# Patient Record
Sex: Male | Born: 1940 | Race: White | Hispanic: No | Smoking: Never smoker
Health system: Southern US, Community
[De-identification: ages and names within clinical notes are randomized; demographics above are authoritative.]

---

## 2020-03-31 ENCOUNTER — Other Ambulatory Visit: Payer: Self-pay

## 2020-03-31 ENCOUNTER — Emergency Department: Payer: Medicare Other

## 2020-03-31 ENCOUNTER — Inpatient Hospital Stay
Admission: EM | Admit: 2020-03-31 | Discharge: 2020-04-12 | DRG: 177 | Disposition: A | Discharge status: Home Health | Payer: Medicare Other | Source: Skilled Nursing Facility | Attending: Family Medicine | Admitting: Family Medicine

## 2020-03-31 ENCOUNTER — Inpatient Hospital Stay: Payer: Medicare Other

## 2020-03-31 ENCOUNTER — Encounter: Payer: Self-pay | Admitting: Radiology

## 2020-03-31 DIAGNOSIS — I248 Other forms of acute ischemic heart disease: Secondary | ICD-10-CM | POA: Diagnosis present

## 2020-03-31 DIAGNOSIS — M109 Gout, unspecified: Secondary | ICD-10-CM | POA: Diagnosis present

## 2020-03-31 DIAGNOSIS — Z7901 Long term (current) use of anticoagulants: Secondary | ICD-10-CM

## 2020-03-31 DIAGNOSIS — R0902 Hypoxemia: Secondary | ICD-10-CM

## 2020-03-31 DIAGNOSIS — Z23 Encounter for immunization: Secondary | ICD-10-CM | POA: Diagnosis not present

## 2020-03-31 DIAGNOSIS — R778 Other specified abnormalities of plasma proteins: Secondary | ICD-10-CM | POA: Diagnosis present

## 2020-03-31 DIAGNOSIS — I2782 Chronic pulmonary embolism: Secondary | ICD-10-CM | POA: Diagnosis present

## 2020-03-31 DIAGNOSIS — J690 Pneumonitis due to inhalation of food and vomit: Secondary | ICD-10-CM | POA: Diagnosis present

## 2020-03-31 DIAGNOSIS — J189 Pneumonia, unspecified organism: Secondary | ICD-10-CM

## 2020-03-31 DIAGNOSIS — Z8249 Family history of ischemic heart disease and other diseases of the circulatory system: Secondary | ICD-10-CM | POA: Diagnosis not present

## 2020-03-31 DIAGNOSIS — Y92231 Patient bathroom in hospital as the place of occurrence of the external cause: Secondary | ICD-10-CM | POA: Diagnosis not present

## 2020-03-31 DIAGNOSIS — R001 Bradycardia, unspecified: Secondary | ICD-10-CM | POA: Diagnosis not present

## 2020-03-31 DIAGNOSIS — E785 Hyperlipidemia, unspecified: Secondary | ICD-10-CM | POA: Diagnosis present

## 2020-03-31 DIAGNOSIS — J9621 Acute and chronic respiratory failure with hypoxia: Secondary | ICD-10-CM | POA: Diagnosis present

## 2020-03-31 DIAGNOSIS — F0390 Unspecified dementia without behavioral disturbance: Secondary | ICD-10-CM | POA: Diagnosis present

## 2020-03-31 DIAGNOSIS — J439 Emphysema, unspecified: Secondary | ICD-10-CM | POA: Diagnosis present

## 2020-03-31 DIAGNOSIS — Z96611 Presence of right artificial shoulder joint: Secondary | ICD-10-CM | POA: Diagnosis present

## 2020-03-31 DIAGNOSIS — I110 Hypertensive heart disease with heart failure: Secondary | ICD-10-CM | POA: Diagnosis present

## 2020-03-31 DIAGNOSIS — Z7952 Long term (current) use of systemic steroids: Secondary | ICD-10-CM | POA: Diagnosis not present

## 2020-03-31 DIAGNOSIS — E440 Moderate protein-calorie malnutrition: Secondary | ICD-10-CM | POA: Diagnosis present

## 2020-03-31 DIAGNOSIS — F32A Depression, unspecified: Secondary | ICD-10-CM | POA: Diagnosis present

## 2020-03-31 DIAGNOSIS — Z6829 Body mass index (BMI) 29.0-29.9, adult: Secondary | ICD-10-CM | POA: Diagnosis not present

## 2020-03-31 DIAGNOSIS — J9622 Acute and chronic respiratory failure with hypercapnia: Secondary | ICD-10-CM | POA: Diagnosis present

## 2020-03-31 DIAGNOSIS — R0602 Shortness of breath: Secondary | ICD-10-CM | POA: Diagnosis not present

## 2020-03-31 DIAGNOSIS — Z79899 Other long term (current) drug therapy: Secondary | ICD-10-CM | POA: Diagnosis not present

## 2020-03-31 DIAGNOSIS — R600 Localized edema: Secondary | ICD-10-CM | POA: Diagnosis present

## 2020-03-31 DIAGNOSIS — I10 Essential (primary) hypertension: Secondary | ICD-10-CM

## 2020-03-31 DIAGNOSIS — S41112A Laceration without foreign body of left upper arm, initial encounter: Secondary | ICD-10-CM | POA: Diagnosis not present

## 2020-03-31 DIAGNOSIS — Z9981 Dependence on supplemental oxygen: Secondary | ICD-10-CM

## 2020-03-31 DIAGNOSIS — I5032 Chronic diastolic (congestive) heart failure: Secondary | ICD-10-CM | POA: Diagnosis present

## 2020-03-31 DIAGNOSIS — W1830XA Fall on same level, unspecified, initial encounter: Secondary | ICD-10-CM | POA: Diagnosis not present

## 2020-03-31 DIAGNOSIS — I214 Non-ST elevation (NSTEMI) myocardial infarction: Secondary | ICD-10-CM | POA: Diagnosis present

## 2020-03-31 DIAGNOSIS — Z20822 Contact with and (suspected) exposure to covid-19: Secondary | ICD-10-CM | POA: Diagnosis present

## 2020-03-31 DIAGNOSIS — Z7982 Long term (current) use of aspirin: Secondary | ICD-10-CM

## 2020-03-31 DIAGNOSIS — J9811 Atelectasis: Secondary | ICD-10-CM

## 2020-03-31 LAB — LIPID PANEL
Cholesterol: 148 mg/dL (ref 0–200)
HDL: 30 mg/dL — ABNORMAL LOW (ref >40)
LDL Cholesterol: 101 mg/dL — ABNORMAL HIGH (ref 0–99)
Total CHOL/HDL Ratio: 4.9 RATIO
Triglycerides: 86 mg/dL (ref <150)
VLDL: 17 mg/dL (ref 0–40)

## 2020-03-31 LAB — BASIC METABOLIC PANEL
Anion gap: 7 (ref 5–15)
Anion gap: 8 (ref 5–15)
BUN: 39 mg/dL — ABNORMAL HIGH (ref 8–23)
BUN: 40 mg/dL — ABNORMAL HIGH (ref 8–23)
CO2: 36 mmol/L — ABNORMAL HIGH (ref 22–32)
CO2: 38 mmol/L — ABNORMAL HIGH (ref 22–32)
Calcium: 9.1 mg/dL (ref 8.9–10.3)
Calcium: 9.1 mg/dL (ref 8.9–10.3)
Chloride: 96 mmol/L — ABNORMAL LOW (ref 98–111)
Chloride: 94 mmol/L — ABNORMAL LOW (ref 98–111)
Creatinine, Ser: 0.74 mg/dL (ref 0.61–1.24)
Creatinine, Ser: 0.78 mg/dL (ref 0.61–1.24)
GFR, Estimated: >60 mL/min (ref >60)
GFR, Estimated: >60 mL/min (ref >60)
Glucose, Bld: 119 mg/dL — ABNORMAL HIGH (ref 70–99)
Glucose, Bld: 105 mg/dL — ABNORMAL HIGH (ref 70–99)
Potassium: 3.8 mmol/L (ref 3.5–5.1)
Potassium: 3.8 mmol/L (ref 3.5–5.1)
Sodium: 139 mmol/L (ref 135–145)
Sodium: 140 mmol/L (ref 135–145)

## 2020-03-31 LAB — HEPARIN LEVEL (UNFRACTIONATED): Heparin Unfractionated: 2.56 IU/mL — ABNORMAL HIGH (ref 0.30–0.70)

## 2020-03-31 LAB — CBC
HCT: 48.4 % (ref 39.0–52.0)
HCT: 45.8 % (ref 39.0–52.0)
Hemoglobin: 13.8 g/dL (ref 13.0–17.0)
Hemoglobin: 13.2 g/dL (ref 13.0–17.0)
MCH: 24.8 pg — ABNORMAL LOW (ref 26.0–34.0)
MCH: 25.2 pg — ABNORMAL LOW (ref 26.0–34.0)
MCHC: 28.5 g/dL — ABNORMAL LOW (ref 30.0–36.0)
MCHC: 28.8 g/dL — ABNORMAL LOW (ref 30.0–36.0)
MCV: 87.1 fL (ref 80.0–100.0)
MCV: 87.4 fL (ref 80.0–100.0)
Platelets: 174 10*3/uL (ref 150–400)
Platelets: 156 10*3/uL (ref 150–400)
RBC: 5.56 MIL/uL (ref 4.22–5.81)
RBC: 5.24 MIL/uL (ref 4.22–5.81)
RDW: 14.6 % (ref 11.5–15.5)
RDW: 14.6 % (ref 11.5–15.5)
WBC: 9.7 10*3/uL (ref 4.0–10.5)
WBC: 8.5 10*3/uL (ref 4.0–10.5)
nRBC: 0 % (ref 0.0–0.2)
nRBC: 0 % (ref 0.0–0.2)

## 2020-03-31 LAB — PROCALCITONIN: Procalcitonin: <0.1 ng/mL

## 2020-03-31 LAB — PROTIME-INR
INR: 1.4 — ABNORMAL HIGH (ref 0.8–1.2)
Prothrombin Time: 16.8 seconds — ABNORMAL HIGH (ref 11.4–15.2)

## 2020-03-31 LAB — HIV ANTIBODY (ROUTINE TESTING W REFLEX): HIV Screen 4th Generation wRfx: NONREACTIVE

## 2020-03-31 LAB — APTT
aPTT: 45 seconds — ABNORMAL HIGH (ref 24–36)
aPTT: 62 seconds — ABNORMAL HIGH (ref 24–36)
aPTT: 84 seconds — ABNORMAL HIGH (ref 24–36)

## 2020-03-31 LAB — RESP PANEL BY RT-PCR (FLU A&B, COVID) ARPGX2
Influenza A by PCR: NEGATIVE
Influenza B by PCR: NEGATIVE
SARS Coronavirus 2 by RT PCR: NEGATIVE

## 2020-03-31 LAB — LACTIC ACID, PLASMA: Lactic Acid, Venous: 1 mmol/L (ref 0.5–1.9)

## 2020-03-31 LAB — TROPONIN I (HIGH SENSITIVITY)
Troponin I (High Sensitivity): 1263 ng/L (ref <18)
Troponin I (High Sensitivity): 1218 ng/L (ref <18)

## 2020-03-31 LAB — BRAIN NATRIURETIC PEPTIDE: B Natriuretic Peptide: 23.2 pg/mL (ref 0.0–100.0)

## 2020-03-31 MED ORDER — SODIUM CHLORIDE 0.9 % IV SOLN: INTRAVENOUS | Status: DC

## 2020-03-31 MED ORDER — HEPARIN BOLUS VIA INFUSION
4000.0000 [IU] | INTRAVENOUS | Status: AC
  Administered 2020-03-31: 4000 [IU] via INTRAVENOUS
  Filled 2020-03-31: qty 4000

## 2020-03-31 MED ORDER — PREDNISOLONE ACETATE 1 % OP SUSP
1.0000 [drp] | Freq: Two times a day (BID) | OPHTHALMIC | Status: DC
  Administered 2020-03-31 – 2020-04-12 (×23): 1 [drp] via OPHTHALMIC
  Filled 2020-03-31: qty 5
  Filled 2020-03-31 (×2): qty 1

## 2020-03-31 MED ORDER — MAGNESIUM OXIDE 400 (241.3 MG) MG PO TABS
400.0000 mg | ORAL_TABLET | Freq: Two times a day (BID) | ORAL | Status: DC
Start: 2020-03-31 — End: 2020-04-13
  Administered 2020-03-31 – 2020-04-12 (×24): 400 mg via ORAL
  Filled 2020-03-31 (×24): qty 1

## 2020-03-31 MED ORDER — ERYTHROMYCIN 5 MG/GM OP OINT
1.0000 "application " | TOPICAL_OINTMENT | Freq: Three times a day (TID) | OPHTHALMIC | Status: DC
  Administered 2020-03-31 – 2020-04-12 (×34): 1 via OPHTHALMIC
  Filled 2020-03-31 (×3): qty 1

## 2020-03-31 MED ORDER — ESCITALOPRAM OXALATE 10 MG PO TABS
20.0000 mg | ORAL_TABLET | Freq: Every day | ORAL | Status: DC
  Administered 2020-03-31 – 2020-04-12 (×13): 20 mg via ORAL
  Filled 2020-03-31 (×13): qty 2

## 2020-03-31 MED ORDER — GUAIFENESIN ER 600 MG PO TB12
600.0000 mg | ORAL_TABLET | Freq: Two times a day (BID) | ORAL | Status: DC
  Administered 2020-03-31 – 2020-04-12 (×24): 600 mg via ORAL
  Filled 2020-03-31 (×24): qty 1

## 2020-03-31 MED ORDER — ALBUTEROL SULFATE HFA 108 (90 BASE) MCG/ACT IN AERS
2.0000 | INHALATION_SPRAY | RESPIRATORY_TRACT | Status: DC | PRN
  Filled 2020-03-31: qty 6.7

## 2020-03-31 MED ORDER — HEPARIN (PORCINE) 25000 UT/250ML-% IV SOLN
1450.0000 [IU]/h | INTRAVENOUS | Status: DC
  Administered 2020-03-31: 22:00:00 1250 [IU]/h via INTRAVENOUS
  Administered 2020-03-31: 04:00:00 1100 [IU]/h via INTRAVENOUS
  Administered 2020-04-01: 1250 [IU]/h via INTRAVENOUS
  Filled 2020-03-31 (×3): qty 250

## 2020-03-31 MED ORDER — THIAMINE HCL 100 MG PO TABS
100.0000 mg | ORAL_TABLET | Freq: Every day | ORAL | Status: DC
  Administered 2020-03-31 – 2020-04-01 (×2): 100 mg via ORAL
  Filled 2020-03-31 (×2): qty 1

## 2020-03-31 MED ORDER — ADULT MULTIVITAMIN W/MINERALS CH
1.0000 | ORAL_TABLET | Freq: Every day | ORAL | Status: DC
  Administered 2020-03-31 – 2020-04-12 (×13): 1 via ORAL
  Filled 2020-03-31 (×14): qty 1

## 2020-03-31 MED ORDER — ASPIRIN 81 MG PO CHEW
324.0000 mg | CHEWABLE_TABLET | ORAL | Status: DC
  Filled 2020-03-31: qty 4

## 2020-03-31 MED ORDER — METHYLPREDNISOLONE SODIUM SUCC 125 MG IJ SOLR
125.0000 mg | Freq: Once | INTRAMUSCULAR | Status: AC
  Administered 2020-03-31: 125 mg via INTRAVENOUS
  Filled 2020-03-31: qty 2

## 2020-03-31 MED ORDER — IPRATROPIUM-ALBUTEROL 0.5-2.5 (3) MG/3ML IN SOLN: 3.0000 mL | RESPIRATORY_TRACT | Status: DC | PRN

## 2020-03-31 MED ORDER — METOPROLOL SUCCINATE ER 50 MG PO TB24: 50.0000 mg | ORAL_TABLET | Freq: Every day | ORAL | Status: DC

## 2020-03-31 MED ORDER — FOLIC ACID 1 MG PO TABS
1.0000 mg | ORAL_TABLET | Freq: Every day | ORAL | Status: DC
  Administered 2020-03-31 – 2020-04-12 (×13): 1 mg via ORAL
  Filled 2020-03-31 (×13): qty 1

## 2020-03-31 MED ORDER — FUROSEMIDE 20 MG PO TABS
20.0000 mg | ORAL_TABLET | Freq: Every day | ORAL | Status: DC | PRN
  Administered 2020-03-31: 20 mg via ORAL
  Filled 2020-03-31: qty 1

## 2020-03-31 MED ORDER — ACETAMINOPHEN 325 MG PO TABS
650.0000 mg | ORAL_TABLET | ORAL | Status: DC | PRN
  Administered 2020-03-31: 650 mg via ORAL
  Filled 2020-03-31: qty 2

## 2020-03-31 MED ORDER — ATORVASTATIN CALCIUM 20 MG PO TABS
80.0000 mg | ORAL_TABLET | Freq: Every day | ORAL | Status: DC
  Administered 2020-03-31 – 2020-04-12 (×13): 80 mg via ORAL
  Filled 2020-03-31 (×5): qty 1
  Filled 2020-03-31 (×4): qty 4
  Filled 2020-03-31 (×2): qty 1
  Filled 2020-03-31 (×2): qty 4

## 2020-03-31 MED ORDER — ASPIRIN EC 325 MG PO TBEC
325.0000 mg | DELAYED_RELEASE_TABLET | Freq: Every day | ORAL | Status: DC
  Administered 2020-03-31 – 2020-04-02 (×3): 325 mg via ORAL
  Filled 2020-03-31 (×3): qty 1

## 2020-03-31 MED ORDER — NITROGLYCERIN 0.4 MG SL SUBL
0.4000 mg | SUBLINGUAL_TABLET | SUBLINGUAL | Status: DC | PRN
Start: 2020-03-31 — End: 2020-04-13

## 2020-03-31 MED ORDER — ZOLPIDEM TARTRATE 5 MG PO TABS
5.0000 mg | ORAL_TABLET | Freq: Every evening | ORAL | Status: DC | PRN
  Administered 2020-04-02 – 2020-04-11 (×4): 5 mg via ORAL
  Filled 2020-03-31 (×4): qty 1

## 2020-03-31 MED ORDER — IOHEXOL 350 MG/ML SOLN
100.0000 mL | Freq: Once | INTRAVENOUS | Status: AC | PRN
  Administered 2020-03-31: 100 mL via INTRAVENOUS

## 2020-03-31 MED ORDER — ALPRAZOLAM 0.5 MG PO TABS
0.2500 mg | ORAL_TABLET | Freq: Two times a day (BID) | ORAL | Status: DC | PRN
  Administered 2020-03-31 – 2020-04-09 (×7): 0.25 mg via ORAL
  Filled 2020-03-31 (×7): qty 1

## 2020-03-31 MED ORDER — IPRATROPIUM-ALBUTEROL 0.5-2.5 (3) MG/3ML IN SOLN: 3.0000 mL | Freq: Four times a day (QID) | RESPIRATORY_TRACT | Status: DC

## 2020-03-31 MED ORDER — METOPROLOL SUCCINATE ER 50 MG PO TB24
75.0000 mg | ORAL_TABLET | Freq: Every day | ORAL | Status: DC
  Administered 2020-03-31 – 2020-04-10 (×10): 75 mg via ORAL
  Filled 2020-03-31 (×4): qty 1
  Filled 2020-03-31: qty 2
  Filled 2020-03-31 (×6): qty 1

## 2020-03-31 MED ORDER — ASPIRIN EC 81 MG PO TBEC: 81.0000 mg | DELAYED_RELEASE_TABLET | Freq: Every day | ORAL | Status: DC

## 2020-03-31 MED ORDER — ASPIRIN 300 MG RE SUPP: 300.0000 mg | RECTAL | Status: DC

## 2020-03-31 MED ORDER — POLYVINYL ALCOHOL 1.4 % OP SOLN
1.0000 [drp] | Freq: Four times a day (QID) | OPHTHALMIC | Status: DC
  Administered 2020-03-31 – 2020-04-12 (×44): 1 [drp] via OPHTHALMIC
  Filled 2020-03-31 (×3): qty 15

## 2020-03-31 MED ORDER — IPRATROPIUM-ALBUTEROL 0.5-2.5 (3) MG/3ML IN SOLN
3.0000 mL | Freq: Four times a day (QID) | RESPIRATORY_TRACT | Status: DC
  Administered 2020-03-31 – 2020-04-02 (×7): 3 mL via RESPIRATORY_TRACT
  Filled 2020-03-31 (×8): qty 3

## 2020-03-31 MED ORDER — ALLOPURINOL 100 MG PO TABS
300.0000 mg | ORAL_TABLET | Freq: Every day | ORAL | Status: DC
  Administered 2020-03-31 – 2020-04-12 (×13): 300 mg via ORAL
  Filled 2020-03-31 (×5): qty 1
  Filled 2020-03-31 (×4): qty 3
  Filled 2020-03-31 (×2): qty 1
  Filled 2020-03-31: qty 3
  Filled 2020-03-31: qty 1

## 2020-03-31 MED ORDER — SODIUM CHLORIDE 0.9 % IV SOLN
2.0000 g | INTRAVENOUS | Status: AC
  Administered 2020-03-31 – 2020-04-04 (×5): 2 g via INTRAVENOUS
  Filled 2020-03-31: qty 2
  Filled 2020-03-31: qty 20
  Filled 2020-03-31 (×2): qty 2
  Filled 2020-03-31: qty 20

## 2020-03-31 MED ORDER — LOSARTAN POTASSIUM 50 MG PO TABS
100.0000 mg | ORAL_TABLET | Freq: Every day | ORAL | Status: DC
  Administered 2020-03-31 – 2020-04-12 (×13): 100 mg via ORAL
  Filled 2020-03-31 (×13): qty 2

## 2020-03-31 MED ORDER — HYDROCHLOROTHIAZIDE 25 MG PO TABS
25.0000 mg | ORAL_TABLET | Freq: Every day | ORAL | Status: DC
  Administered 2020-03-31 – 2020-04-12 (×13): 25 mg via ORAL
  Filled 2020-03-31 (×13): qty 1

## 2020-03-31 MED ORDER — HEPARIN SODIUM (PORCINE) 5000 UNIT/ML IJ SOLN: 4000.0000 [IU] | Freq: Once | INTRAMUSCULAR | Status: DC

## 2020-03-31 MED ORDER — IPRATROPIUM-ALBUTEROL 0.5-2.5 (3) MG/3ML IN SOLN
3.0000 mL | Freq: Once | RESPIRATORY_TRACT | Status: AC
  Administered 2020-03-31: 3 mL via RESPIRATORY_TRACT
  Filled 2020-03-31: qty 3

## 2020-03-31 MED ORDER — HEPARIN BOLUS VIA INFUSION
1200.0000 [IU] | Freq: Once | INTRAVENOUS | Status: AC
  Administered 2020-03-31: 1200 [IU] via INTRAVENOUS
  Filled 2020-03-31: qty 1200

## 2020-03-31 MED ORDER — FUROSEMIDE 10 MG/ML IJ SOLN
40.0000 mg | Freq: Once | INTRAMUSCULAR | Status: AC
  Administered 2020-03-31: 40 mg via INTRAVENOUS
  Filled 2020-03-31: qty 4

## 2020-03-31 MED ORDER — AZITHROMYCIN 500 MG IV SOLR
500.0000 mg | INTRAVENOUS | Status: AC
  Administered 2020-03-31 – 2020-04-04 (×5): 500 mg via INTRAVENOUS
  Filled 2020-03-31 (×2): qty 500
  Filled 2020-03-31: qty 131.58
  Filled 2020-03-31 (×2): qty 500

## 2020-03-31 MED ORDER — APIXABAN 5 MG PO TABS: 5.0000 mg | ORAL_TABLET | Freq: Two times a day (BID) | ORAL | Status: DC

## 2020-03-31 MED ORDER — HEPARIN (PORCINE) 25000 UT/250ML-% IV SOLN: 14.0000 [IU]/kg/h | INTRAVENOUS | Status: DC

## 2020-03-31 MED ORDER — ONDANSETRON HCL 4 MG/2ML IJ SOLN: 4.0000 mg | Freq: Four times a day (QID) | INTRAMUSCULAR | Status: DC | PRN

## 2020-03-31 NOTE — ED Notes (Addendum)
Lab to come draw 2000 due labs.

## 2020-03-31 NOTE — Progress Notes (Signed)
ANTICOAGULATION CONSULT NOTE   Pharmacy Consult for Heparin Infusion Indication: ACS/STEMI  Allergies  Allergen Reactions  . Amlodipine     Patient Measurements: Height: 5\' 7"  (170.2 cm) Weight: 84.8 kg (187 lb) IBW/kg (Calculated) : 66.1 Heparin Dosing Weight: 83.3 kg  Vital Signs: Temp: 98.5 F (36.9 C) (02/13 0056) Temp Source: Oral (02/13 0056) BP: 119/75 (02/13 0056) Pulse Rate: 69 (02/13 0056)  Labs: Recent Labs    03/31/20 0104  HGB 13.8  HCT 48.4  PLT 174  CREATININE 0.74  TROPONINIHS 1,263*    Estimated Creatinine Clearance: 77.9 mL/min (by C-G formula based on SCr of 0.74 mg/dL).   Medical History: Pt on Eliquis 5 mg BID PTA.  Assessment: Pt is 80 yo male c/o SOB arriving at ED via EMS with elevated troponin lvl .  Pt has  Hx of PE in Dec 21.    Goal of Therapy:  APTT 66-102 Heparin level 0.3-0.7 units/ml Monitor platelets by anticoagulation protocol: Yes   Plan:  Give 4000 unit bolus x1 Start heparin infusion at 1100 units/hr Will initially follow aPTT vs HL due to DOAC PTA Check aPTT in 8 hours then daily Check HL daily until correlates with aPTT Continue to monitor H&H and plts.  Dec 23, PharmD, [facility] 03/31/2020 5:08 AM

## 2020-03-31 NOTE — Progress Notes (Signed)
ANTICOAGULATION CONSULT NOTE   Pharmacy Consult for Heparin Infusion (apixaban PTA) Indication: ACS/STEMI  Allergies  Allergen Reactions  . Amlodipine     Patient Measurements: Height: 5\' 7"  (170.2 cm) Weight: 84.8 kg (187 lb) IBW/kg (Calculated) : 66.1 Heparin Dosing Weight: 83.3 kg  Vital Signs: Temp: 98.5 F (36.9 C) (02/13 2000) Temp Source: Oral (02/13 2000) BP: 130/77 (02/13 2000) Pulse Rate: 66 (02/13 2030)  Labs: Recent Labs    03/31/20 0104 03/31/20 0332 03/31/20 0551 03/31/20 1200 03/31/20 2038  HGB 13.8 13.2  --   --   --   HCT 48.4 45.8  --   --   --   PLT 174 156  --   --   --   APTT  --  45*  --  62* 84*  LABPROT  --  16.8*  --   --   --   INR  --  1.4*  --   --   --   HEPARINUNFRC  --   --   --  2.56*  --   CREATININE 0.74 0.78  --   --   --   TROPONINIHS 1,263*  --  1,218*  --   --     Estimated Creatinine Clearance: 77.9 mL/min (by C-G formula based on SCr of 0.78 mg/dL).   Medical History: Pt on Eliquis 5 mg BID PTA.-last dose 2/12 at 2000 per Med Rec  Assessment: Pt is 80 yo male c/o SOB arriving at ED via EMS with elevated troponin lvl .  Pt has  Hx of PE in Dec 21.    Goal of Therapy:  APTT 66-102 Heparin level 0.3-0.7 units/ml Monitor platelets by anticoagulation protocol: Yes  2/13 1200 aPTT=62, subtherapeutic  HL 2.56 2/13 2038 aPTT=84, therapeutic   Plan:  Continue heparin infusion at 1250 units/hr. Will initially follow aPTT vs HL due to DOAC PTA. Will recheck aPTT in 8 hrs. F/u CBC daily and follow for HL/aPTT correlation. Continue to monitor H&H and plts.  2039, PharmD Clinical Pharmacist 03/31/2020

## 2020-03-31 NOTE — ED Notes (Signed)
While this RN was in another pt room, pt removed HFNC, pulled out 2 IV's, pt with blood on hands and bed and floor and slippers. Pt not aware of problem with pulling out IVs or HFNC. Pt cleaned, HFNC replaced. Pt alert, disoriented to situation. Pt in NAD during this time. Pressure bandages placed to IV sites. Pt placed farther back in bed, pt compliant. Charge nurse notified.

## 2020-03-31 NOTE — ED Notes (Signed)
Pt repositioned back in bed x 4 times since 0730. Pt continues to move to end of bed, pt states he will not get OOB, but sitting on end of bed is more comfortable. Per pt's daughter, pt is used to sleeping and sitting in recliner all day. Pt propped up with multiple pillows, pt states he is more comfortable, pt not attempting to get OOB.

## 2020-03-31 NOTE — ED Notes (Signed)
Patient attempting to get out of bed. Patient redirected and placed back in bed, patient able to urinate using external cath.  Patient is currently alert and only oriented to self and time. Patient appears agitated but is easily reassured and redirected at this time.

## 2020-03-31 NOTE — ED Triage Notes (Signed)
Pt BIB EMS from [facility] with complaint of shortness of breath starting earlier today. Per EMS, pt is chronically on 2.5L and was given a nebulizer treatment en route with improvement in work of breathing. Pt on 4L [place] at this time with O2 saturation 93%. Pt endorses improvement in shortness of breath after nebulizer treatment with EMS, states shortness of breath is only when talking now. Denies cough or chest pain at this time. Swelling noted to bilateral lower extremities, pt states no more swelling than normal.

## 2020-03-31 NOTE — ED Notes (Signed)
This RN updated daughter Tobi Bastos.

## 2020-03-31 NOTE — H&P (Addendum)
[place]   PATIENT NAME: Melvin Campbell    MR#:  [id number]  DATE OF BIRTH:  02-28-1940  DATE OF ADMISSION:  03/31/2020  PRIMARY CARE PHYSICIAN: Patient, No Pcp Per   Patient is coming from: Skilled nursing facility  REQUESTING/REFERRING PHYSICIAN: Chiquita Loth, MD CHIEF COMPLAINT:   Chief Complaint  Patient presents with  . Shortness of Breath    HISTORY OF PRESENT ILLNESS:  Melvin Campbell is a 80 y.o. male with medical history significant for hypertension, dyslipidemia and PE with chronic respiratory failure on home O2 at 2.5 L/min by nasal cannula, and dementia who presented to the emergency room with acute onset of worsening dyspnea at his skilled nursing facility with associated hypoxemia with a pulse oximetry of 87% on his O2 at 2.5 L and wheezing.  Pulse oximetry improved to 93% on 4 L of O2.  He denied chest pain nausea vomiting or diaphoresis or palpitations.  He admits to lower extremity edema.  No fever or chills.  No bleeding diathesis.  No dysuria, oliguria or hematuria or flank pain. ED Course: Upon admission to the ER respiratory it was 22 and possibly 93% on 4 L of O2 by nasal cannula with otherwise normal vital signs.  Labs revealed a BUN of 39 creatinine 0.74, BNP of 23.2 and high-sensitivity troponin of 1263 with lactic acid of 1 and CBC levels unremarkable.  Influenza antigens and COVID-19 PCR came back negative.  Blood cultures were drawn.  INR is 1.4 and PT 16.8 with PTT 45.  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 Imaging: Chest x-ray showed low lung volumes with streaky bibasilar opacities favoring atelectasis with mild diffuse peribronchial thickening, diffuse aortic atherosclerosis and aortic tortuosity.  Chest CTA revealed no PE.  Showed cardiomegaly and dilated main pulmonary artery consistent with pulmonary artery hypertension  by basal independent right middle lobe consolidation with volume loss likely due to atelectasis and differential diagnosis would include pneumonia.  Aortic atherosclerosis tortuosity and moderate emphysema.  The patient was given a heparin IV bolus and infusion, 125 mg of IV Solu-Medrol and DuoNeb.  The patient will be admitted to progressive unit bed for further evaluation and management PAST MEDICAL HISTORY:  Hypertension, dyslipidemia, gout and PE with chronic respiratory failure on home O2 and dementia.  PAST SURGICAL HISTORY:  Cholecystectomy SOCIAL HISTORY:   Social History   Tobacco Use  . Smoking status: Not on file  . Smokeless tobacco: Not on file  Substance Use Topics  . Alcohol use: Not on file  No history of tobacco EtOH abuse or illicit drug use. FAMILY HISTORY:  Positive for coronary artery disease, hypertension and CVA.  DRUG ALLERGIES:   Allergies  Allergen Reactions  . Amlodipine     REVIEW OF SYSTEMS:   ROS As per history of present illness. All pertinent systems were reviewed above. Constitutional, HEENT, cardiovascular, respiratory, GI, GU, musculoskeletal, neuro, psychiatric, endocrine, integumentary and hematologic systems were reviewed and are otherwise negative/unremarkable except for positive findings mentioned above in the HPI.   MEDICATIONS AT HOME:   Prior to Admission medications   Not on File      VITAL SIGNS:  Blood pressure 119/75, pulse 69, temperature 98.5 F (36.9 C), temperature source Oral, resp. rate (!) 22, height 5\' 7"  (1.702 m), weight 84.8 kg, SpO2 (!) 87 %.  PHYSICAL EXAMINATION:  Physical Exam  GENERAL:  80 y.o.-year-old somnolent but arousable Caucasian male patient lying in the bed with no acute distress.  EYES: Pupils equal, round, reactive to light and accommodation. No scleral icterus. Extraocular muscles intact.  HEENT: Head atraumatic, normocephalic. Oropharynx and nasopharynx clear.  NECK:  Supple, no jugular  venous distention. No thyroid enlargement, no tenderness.  LUNGS: Normal breath sounds bilaterally, occasional wheezing, rales,rhonchi or crepitation. No use of accessory muscles of respiration.  CARDIOVASCULAR: Regular rate and rhythm, S1, S2 normal. No murmurs, rubs, or gallops.  ABDOMEN: Soft, nondistended, nontender. Bowel sounds present. No organomegaly or mass.  EXTREMITIES: 1-2+ bilateral ankle and lower leg edema, with no cyanosis, or clubbing.  NEUROLOGIC: Cranial nerves II through XII are intact. Muscle strength 5/5 in all extremities. Sensation intact. Gait not checked.  PSYCHIATRIC: The patient is alert and oriented x 3.  Normal affect and good eye contact. SKIN: No obvious rash, lesion, or ulcer.   LABORATORY PANEL:   CBC Recent Labs  Lab 03/31/20 0104  WBC 9.7  HGB 13.8  HCT 48.4  PLT 174   ------------------------------------------------------------------------------------------------------------------  Chemistries  Recent Labs  Lab 03/31/20 0104  NA 139  K 3.8  CL 96*  CO2 36*  GLUCOSE 119*  BUN 39*  CREATININE 0.74  CALCIUM 9.1   ------------------------------------------------------------------------------------------------------------------  Cardiac Enzymes No results for input(s): TROPONINI in the last 168 hours. ------------------------------------------------------------------------------------------------------------------  RADIOLOGY:  DG Chest 2 View  Result Date: 03/31/2020 CLINICAL DATA:  Shortness of breath. EXAM: CHEST - 2 VIEW COMPARISON:  None available. FINDINGS: Low lung volumes limit assessment. Heart size upper normal. Aortic atherosclerosis and tortuosity. There are streaky bibasilar opacities. Mild diffuse peribronchial thickening. No significant pleural effusion. No pneumothorax. Degenerative change throughout the spine. Right shoulder arthroplasty. No acute osseous abnormalities are seen. IMPRESSION: 1. Low lung volumes with streaky  bibasilar opacities, favoring atelectasis. Mild diffuse peribronchial thickening. 2. Diffuse aortic atherosclerosis. Aortic tortuosity. Aortic Atherosclerosis (ICD10-I70.0). Electronically Signed   By: Narda Rutherford M.D.   On: 03/31/2020 01:42      IMPRESSION AND PLAN:  Active Problems:   NSTEMI (non-ST elevated myocardial infarction) (HCC) 1.  Non-ST elevation MI. -The patient will be admitted to a progressive unit bed. -He will be continued on IV heparin and will hold off his p.o. Eliquis. -We will follow serial troponin I's. -He will be on aspirin as well as high-dose statin therapy, beta-blocker therapy and as needed sublingual nitroglycerin and morphine sulfate for pain. -2D echo and a cardiology consult will be obtained. -I notified Dr. Lady Gary about the patient.  2.  Acute hypoxemia with acute on chronic hypoxic respiratory failure with associated wheezing.  Chest CTA was negative for PE but showed possible right middle lobe atelectasis versus pneumonia. -We will follow blood cultures and place the patient on IV Rocephin and Zithromax for now monitoring pulse oximetry. -Mucolytic therapy will be provided. -We will continue bronchodilator therapy. -We will obtain sputum culture.  3.  Essential hypertension. -Continue Toprol-XL, Cozaar and HCTZ.  4.  Depression. -We will continue Lexapro.  5.  Gout. -We will continue allopurinol.  DVT prophylaxis: IV heparin Code Status: full code. Family Communication:  The plan of care was discussed in details with the patient (and family). I answered all questions. The patient agreed to proceed with the above mentioned plan. Further management will depend upon hospital course. Disposition Plan: Back to skilled nursing facility Consults called: none. All the records are reviewed and case discussed with ED provider.  Status is: Inpatient  Remains inpatient appropriate because:Ongoing diagnostic testing needed not appropriate for  outpatient work up, Unsafe d/c plan, IV treatments appropriate due to intensity of illness or inability to take PO and Inpatient level of care appropriate due to severity of illness   Dispo: The patient is from: SNF              Anticipated d/c is to: SNF              Anticipated d/c date is: 2 days              Patient currently is not medically stable to d/c.   Difficult to place patient No   TOTAL TIME TAKING CARE OF THIS PATIENT: 55 minutes.    Hannah Beat M.D on 03/31/2020 at 2:49 AM  [facility]   From 7 PM-7 AM, contact night-coverage [email or web address]  CC: Primary care physician; Patient, No Pcp Per

## 2020-03-31 NOTE — ED Notes (Signed)
Daughter at bedside.

## 2020-03-31 NOTE — ED Provider Notes (Incomplete)
[facility] Emergency Department Provider Note   ____________________________________________   Event Date/Time   First MD Initiated Contact with Patient 03/31/20 0132     (approximate)  I have reviewed the triage vital signs and the nursing notes.   HISTORY  Chief Complaint Shortness of Breath    HPI Melvin Campbell is a 80 y.o. male ***        {**SYMPTOM/COMPLAINT  LOCATION (describe anatomically) DURATION (when did it start) TIMING (onset and pattern) SEVERITY (0-10, mild/moderate/severe) QUALITY (description of symptoms) CONTEXT (recent surgery, new meds, activity, etc.) MODIFYINGFACTORS (what makes it better/worse) ASSOCIATEDSYMPTOMS (pertinent positives and negatives)**} No past medical history on file.  There are no problems to display for this patient.   *** The histories are not reviewed yet. Please review them in the "History" navigator section and refresh this SmartLink.  Prior to Admission medications   Not on File    Allergies Amlodipine  No family history on file.  Social History    Review of Systems {** Revise as appropriate then delete this line - Documentation of 10 systems is required  **} Constitutional: No fever/chills Eyes: No visual changes. ENT: No sore throat. Cardiovascular: Denies chest pain. Respiratory: Denies shortness of breath. Gastrointestinal: No abdominal pain.  No nausea, no vomiting.  No diarrhea.  No constipation. Genitourinary: Negative for dysuria. Musculoskeletal: Negative for back pain. Skin: Negative for rash. Neurological: Negative for headaches, focal weakness or numbness. {**Psychiatric:  Endocrine:  Hematological/Lymphatic:  Allergic/Immunilogical: **}  ____________________________________________   PHYSICAL EXAM:  VITAL SIGNS: ED Triage Vitals  Enc Vitals Group     BP 03/31/20 0056 119/75     Pulse Rate 03/31/20 0056 69     Resp 03/31/20 0056 (!) 22     Temp  03/31/20 0056 98.5 F (36.9 C)     Temp Source 03/31/20 0056 Oral     SpO2 03/31/20 0056 93 %     Weight --      Height --      Head Circumference --      Peak Flow --      Pain Score 03/31/20 0057 0     Pain Loc --      Pain Edu? --      Excl. in GC? --    {** Revise as appropriate then delete this line - 8 systems required **} Constitutional: Alert and oriented. Well appearing and in no acute distress. Eyes: Conjunctivae are normal. PERRL. EOMI. Head: Atraumatic. Nose: No congestion/rhinnorhea. Mouth/Throat: Mucous membranes are moist.  Oropharynx non-erythematous. Neck: No stridor.  {**No cervical spine tenderness to palpation.**} {**Hematological/Lymphatic/Immunilogical: No cervical lymphadenopathy. **}Cardiovascular: Normal rate, regular rhythm. Grossly normal heart sounds.  Good peripheral circulation. Respiratory: Normal respiratory effort.  No retractions. Lungs CTAB. Gastrointestinal: Soft and nontender. No distention. No abdominal bruits. No CVA tenderness. {**Genitourinary:  **}Musculoskeletal: No lower extremity tenderness nor edema.  No joint effusions. Neurologic:  Normal speech and language. No gross focal neurologic deficits are appreciated. No gait instability. Skin:  Skin is warm, dry and intact. No rash noted. Psychiatric: Mood and affect are normal. Speech and behavior are normal.  ____________________________________________   LABS (all labs ordered are listed, but only abnormal results are displayed)  Labs Reviewed  RESP PANEL BY RT-PCR (FLU A&B, COVID) ARPGX2  BASIC METABOLIC PANEL  CBC  BRAIN NATRIURETIC PEPTIDE  TROPONIN I (HIGH SENSITIVITY)   ____________________________________________  EKG  *** ____________________________________________  RADIOLOGY I, Nadelyn Enriques J, personally viewed and evaluated these images (plain  radiographs) as part of my medical decision making, as well as reviewing the written report by the radiologist.  ED MD  interpretation:  ***  Official radiology report(s): No results found.  ____________________________________________   PROCEDURES  Procedure(s) performed (including Critical Care):  Procedures   ____________________________________________   INITIAL IMPRESSION / ASSESSMENT AND PLAN / ED COURSE  As part of my medical decision making, I reviewed the following data within the electronic MEDICAL RECORD NUMBER {[id number]::"Notes from prior ED visits","[place] Controlled Substance Database"}        ***      ____________________________________________   FINAL CLINICAL IMPRESSION(S) / ED DIAGNOSES  Final diagnoses:  COPD exacerbation [facility])     ED Discharge Orders    None      *Please note:  Melvin Campbell was evaluated in Emergency Department on 03/31/2020 for the symptoms described in the history of present illness. He was evaluated in the context of the global COVID-19 pandemic, which necessitated consideration that the patient might be at risk for infection with the SARS-CoV-2 virus that causes COVID-19. Institutional protocols and algorithms that pertain to the evaluation of patients at risk for COVID-19 are in a state of rapid change based on information released by regulatory bodies including the CDC and federal and state organizations. These policies and algorithms were followed during the patient's care in the ED.  Some ED evaluations and interventions may be delayed as a result of limited staffing during and the pandemic.*   Note:  This document was prepared using Dragon voice recognition software and may include unintentional dictation errors.

## 2020-03-31 NOTE — ED Notes (Signed)
First Nurse, Danelle Earthly, made aware of patient's acuity level.

## 2020-03-31 NOTE — ED Notes (Signed)
Pt remains in bed, propped up on pillows, pt not attempting to get OOB, pt sleeping at this time.

## 2020-03-31 NOTE — ED Notes (Signed)
HFNC applied at 10 L/min.

## 2020-03-31 NOTE — ED Provider Notes (Signed)
[facility] Emergency Department Provider Note   ____________________________________________   Event Date/Time   First MD Initiated Contact with Patient 03/31/20 0132     (approximate)  I have reviewed the triage vital signs and the nursing notes.   HISTORY  Chief Complaint Shortness of Breath  Level V caveat: Limited by dementia  HPI Melvin Campbell is a 80 y.o. male brought to the ED via EMS from the [facility] with shortness of breath.  Patient has a history of PE 01/2020 on Eliquis and chronic oxygen 2.5 L nasal cannula.  EMS gave nebulizer treatment in route with improvement in work of breathing.  On his 2.5 L nasal cannula, patient saturations are 87%; increased to 4 L with improvement to 93%.  History is somewhat limited secondary to patient's dementia but patient denies fever, cough, chest pain, abdominal pain, nausea or vomiting.  BLE swelling noted but patient states this is baseline.     Past medical history PE  Corneal ulcer Alcohol abuse Cognitive impairment Gait instability Hyponatremia Essential hypertension BLE edema    Patient Active Problem List   Diagnosis Date Noted  . NSTEMI (non-ST elevated myocardial infarction) (HCC) 03/31/2020    Prior to Admission medications   Medication Sig Start Date End Date Taking? Authorizing Provider  acetaminophen (TYLENOL) 325 MG tablet Take 650 mg by mouth every 4 (four) hours as needed. 12/31/19  Yes [provider]  allopurinol (ZYLOPRIM) 300 MG tablet Take 300 mg by mouth daily. 03/23/20  Yes [provider]  ARTIFICIAL TEARS 1.4 % ophthalmic solution Place 1 drop into the right eye every 6 (six) hours. 03/08/20  Yes [provider]  ELIQUIS 5 MG TABS tablet Take 5 mg by mouth 2 (two) times daily. 03/23/20  Yes [provider]  erythromycin ophthalmic ointment Place 1 application into the right eye 3 (three) times daily. 02/28/20  Yes [provider]   escitalopram (LEXAPRO) 20 MG tablet Take 20 mg by mouth daily. 03/23/20  Yes [provider]  folic acid (FOLVITE) 1 MG tablet Take 1 mg by mouth daily. 03/23/20  Yes [provider]  furosemide (LASIX) 20 MG tablet Take 20 mg by mouth daily as needed. 11/28/19  Yes [provider]  hydrochlorothiazide (HYDRODIURIL) 25 MG tablet Take 25 mg by mouth daily. 03/23/20  Yes [provider]  losartan (COZAAR) 100 MG tablet Take 100 mg by mouth daily. 03/23/20  Yes [provider]  magnesium oxide (MAG-OX) 400 (241.3 Mg) MG tablet Take 1 tablet by mouth 2 (two) times daily. 03/23/20  Yes [provider]  metoprolol succinate (TOPROL-XL) 25 MG 24 hr tablet Take 25 mg by mouth daily. 03/25/20  Yes [provider]  metoprolol succinate (TOPROL-XL) 50 MG 24 hr tablet Take 50 mg by mouth daily. 03/23/20  Yes [provider]  Multiple Vitamins-Minerals (MULTI-DAY PLUS MINERALS) TABS Take 1 tablet by mouth daily. 03/23/20  Yes [provider]  prednisoLONE acetate (PRED FORTE) 1 % ophthalmic suspension Administer 1 drop to the right eye two (2) times a day. 02/28/20  Yes [provider]  Thiamine HCl (B-1) 100 MG TABS Take 1 tablet by mouth daily. 03/23/20  Yes [provider]  albuterol (VENTOLIN HFA) 108 (90 Base) MCG/ACT inhaler Inhale 2 puffs into the lungs every 4 (four) hours as needed. 01/02/20   [provider]    Allergies Amlodipine  No family history on file.  Social History    Review of  Systems  Constitutional: No fever/chills Eyes: No visual changes. ENT: No sore throat. Cardiovascular: Denies chest pain. Respiratory: Positive for shortness of breath. Gastrointestinal: No abdominal pain.  No nausea, no vomiting.  No diarrhea.  No constipation. Genitourinary: Negative for dysuria. Musculoskeletal: Negative for back pain. Skin: Negative for rash. Neurological: Negative for headaches, focal  weakness or numbness.   ____________________________________________   PHYSICAL EXAM:  VITAL SIGNS: ED Triage Vitals  Enc Vitals Group     BP 03/31/20 0056 119/75     Pulse Rate 03/31/20 0056 69     Resp 03/31/20 0056 (!) 22     Temp 03/31/20 0056 98.5 F (36.9 C)     Temp Source 03/31/20 0056 Oral     SpO2 03/31/20 0056 93 %     Weight --      Height --      Head Circumference --      Peak Flow --      Pain Score 03/31/20 0057 0     Pain Loc --      Pain Edu? --      Excl. in GC? --     Constitutional: Alert and oriented.  Elderly appearing and in no acute distress. Eyes: Conjunctivae are normal. PERRL. EOMI. Head: Atraumatic. Nose: No congestion/rhinnorhea. Mouth/Throat: Mucous membranes are mildly dry.   Neck: No stridor.   Cardiovascular: Normal rate, regular rhythm. Grossly normal heart sounds.  Good peripheral circulation. Respiratory: Increased respiratory effort.  No retractions. Lungs with scattered wheezing. Gastrointestinal: Soft and nontender. No distention. No abdominal bruits. No CVA tenderness. Musculoskeletal: No lower extremity tenderness.  1+ BLE nonpitting edema.  No joint effusions. Neurologic:  Normal speech and language. No gross focal neurologic deficits are appreciated.  Skin:  Skin is warm, dry and intact. No rash noted. Psychiatric: Mood and affect are normal. Speech and behavior are normal.  ____________________________________________   LABS (all labs ordered are listed, but only abnormal results are displayed)  Labs Reviewed  BASIC METABOLIC PANEL - Abnormal; Notable for the following components:      Result Value   Chloride 96 (*)    CO2 36 (*)    Glucose, Bld 119 (*)    BUN 39 (*)    All other components within normal limits  CBC - Abnormal; Notable for the following components:   MCH 24.8 (*)    MCHC 28.5 (*)    All other components within normal limits  APTT - Abnormal; Notable for the following components:   aPTT 45 (*)     All other components within normal limits  PROTIME-INR - Abnormal; Notable for the following components:   Prothrombin Time 16.8 (*)    INR 1.4 (*)    All other components within normal limits  BASIC METABOLIC PANEL - Abnormal; Notable for the following components:   Chloride 94 (*)    CO2 38 (*)    Glucose, Bld 105 (*)    BUN 40 (*)    All other components within normal limits  LIPID PANEL - Abnormal; Notable for the following components:   HDL 30 (*)    LDL Cholesterol 101 (*)    All other components within normal limits  CBC - Abnormal; Notable for the following components:   MCH 25.2 (*)    MCHC 28.8 (*)    All other components within normal limits  TROPONIN I (HIGH SENSITIVITY) - Abnormal; Notable for the following components:   Troponin I (High Sensitivity) 1,263 (*)  All other components within normal limits  RESP PANEL BY RT-PCR (FLU A&B, COVID) ARPGX2  CULTURE, BLOOD (ROUTINE X 2)  CULTURE, BLOOD (ROUTINE X 2)  EXPECTORATED SPUTUM ASSESSMENT W REFEX TO RESP CULTURE  BRAIN NATRIURETIC PEPTIDE  LACTIC ACID, PLASMA  PROCALCITONIN  HIV ANTIBODY (ROUTINE TESTING W REFLEX)  HEPARIN LEVEL (UNFRACTIONATED)  APTT  TROPONIN I (HIGH SENSITIVITY)   ____________________________________________  EKG  ED ECG REPORT I, Ariele Vidrio J, the attending physician, personally viewed and interpreted this ECG.   Date: 03/31/2020  EKG Time: 0100  Rate: 66  Rhythm: normal EKG, normal sinus rhythm  Axis: LAD  Intervals:left anterior fascicular block  ST&T Change: Nonspecific  ____________________________________________  RADIOLOGY I, Genifer Lazenby J, personally viewed and evaluated these images (plain radiographs) as part of my medical decision making, as well as reviewing the written report by the radiologist.  ED MD interpretation: Streaky bibasilar opacities, likely atelectasis; no PE  Official radiology report(s): DG Chest 2 View  Result Date: 03/31/2020 CLINICAL DATA:   Shortness of breath. EXAM: CHEST - 2 VIEW COMPARISON:  None available. FINDINGS: Low lung volumes limit assessment. Heart size upper normal. Aortic atherosclerosis and tortuosity. There are streaky bibasilar opacities. Mild diffuse peribronchial thickening. No significant pleural effusion. No pneumothorax. Degenerative change throughout the spine. Right shoulder arthroplasty. No acute osseous abnormalities are seen. IMPRESSION: 1. Low lung volumes with streaky bibasilar opacities, favoring atelectasis. Mild diffuse peribronchial thickening. 2. Diffuse aortic atherosclerosis. Aortic tortuosity. Aortic Atherosclerosis (ICD10-I70.0). Electronically Signed   By: Narda Rutherford M.D.   On: 03/31/2020 01:42   CT Angio Chest PE W/Cm &/Or Wo Cm  Result Date: 03/31/2020 CLINICAL DATA:  PE suspected, high prob PE in 01/2020; hypoxic EXAM: CT ANGIOGRAPHY CHEST WITH CONTRAST TECHNIQUE: Multidetector CT imaging of the chest was performed using the standard protocol during bolus administration of intravenous contrast. Multiplanar CT image reconstructions and MIPs were obtained to evaluate the vascular anatomy. CONTRAST:  OMNIPAQUE IOHEXOL 350 MG/ML SOLN COMPARISON:  Chest radiograph earlier today.  No prior CT available. FINDINGS: Cardiovascular: No evidence of acute or chronic pulmonary embolus. There are no intraluminal pulmonary arterial filling defects. No intraluminal webs. Dilated main pulmonary artery at 4 cm. Multi chamber cardiomegaly. Coronary artery calcifications. Aortic atherosclerosis and tortuosity. Cannot assess for dissection given phase of contrast tailored to pulmonary artery evaluation. No pericardial effusion. Mediastinum/Nodes: Prominent right lower paratracheal node measures 10 mm. 10 mm left hilar node. No visualized thyroid nodule. Patulous esophagus. Lungs/Pleura: Bibasilar and dependent right middle lobe consolidation. Bibasilar volume loss. Moderate emphysema. No septal thickening or  pulmonary edema no significant pleural effusion. Upper Abdomen: Left perinephric edema is partially included, nonspecific. Lobulated splenic contours. Musculoskeletal: Diffuse thoracic spondylosis. Subacute or remote lower lateral left rib fractures with callus formation. Right shoulder arthroplasty. Review of the MIP images confirms the above findings. IMPRESSION: 1. No pulmonary embolus. Cardiomegaly. Dilated main pulmonary artery consistent with pulmonary arterial hypertension. 2. Bibasilar and dependent right middle lobe consolidation with volume loss. Atelectasis is favored, however sterility is indeterminate by imaging, recommend correlation for pneumonia symptoms. 3. Nonspecific left perinephric edema in the upper abdomen is partially included, may be chronic or seen with urinary tract infection. 4. Aortic atherosclerosis and tortuosity. 5. Moderate emphysema Aortic Atherosclerosis (ICD10-I70.0) and Emphysema (ICD10-J43.9). Electronically Signed   By: Narda Rutherford M.D.   On: 03/31/2020 03:43    ____________________________________________   PROCEDURES  Procedure(s) performed (including Critical Care):  Procedures  .1-3 Lead EKG Interpretation Performed by: Irean Hong,  MD Authorized by: Irean Hong, MD     Interpretation: normal     ECG rate:  66   ECG rate assessment: normal     Rhythm: sinus rhythm     Ectopy: none     Conduction: normal   Comments:     Patient placed on cardiac monitor to evaluate for arrhythmias    CRITICAL CARE Performed by: Irean Hong   Total critical care time: 45 minutes  Critical care time was exclusive of separately billable procedures and treating other patients.  Critical care was necessary to treat or prevent imminent or life-threatening deterioration.  Critical care was time spent personally by me on the following activities: development of treatment plan with patient and/or surrogate as well as nursing, discussions with consultants,  evaluation of patient's response to treatment, examination of patient, obtaining history from patient or surrogate, ordering and performing treatments and interventions, ordering and review of laboratory studies, ordering and review of radiographic studies, pulse oximetry and re-evaluation of patient's condition.  ____________________________________________   INITIAL IMPRESSION / ASSESSMENT AND PLAN / ED COURSE  As part of my medical decision making, I reviewed the following data within the electronic MEDICAL RECORD NUMBER Nursing notes reviewed and incorporated, Labs reviewed, EKG interpreted, Old chart reviewed, Radiograph reviewed, Discussed with admitting physician and Notes from prior ED visits     80 year old male sent from the [facility] for shortness of breath Differential includes, but is not limited to, viral syndrome, bronchitis including COPD exacerbation, pneumonia, reactive airway disease including asthma, CHF including exacerbation with or without pulmonary/interstitial edema, pneumothorax, ACS, thoracic trauma, and pulmonary embolism.  Will administer DuoNeb and IV Solu-Medrol as clinically patient sounds like he has an element of COPD.  Will obtain CTA chest to evaluate PE progression.  Clinical Course as of 03/31/20 6270  Wynelle Link Mar 31, 2020  0231 Elevated troponin noted.  Will initiate heparin bolus with drip. [JS]    Clinical Course User Index [JS] Irean Hong, MD     ____________________________________________   FINAL CLINICAL IMPRESSION(S) / ED DIAGNOSES  Final diagnoses:  Shortness of breath  Hypoxia  NSTEMI (non-ST elevated myocardial infarction) [facility])     ED Discharge Orders    None      *Please note:  Vegas Fritze was evaluated in Emergency Department on 03/31/2020 for the symptoms described in the history of present illness. He was evaluated in the context of the global COVID-19 pandemic, which necessitated consideration that the patient might be at risk for  infection with the SARS-CoV-2 virus that causes COVID-19. Institutional protocols and algorithms that pertain to the evaluation of patients at risk for COVID-19 are in a state of rapid change based on information released by regulatory bodies including the CDC and federal and state organizations. These policies and algorithms were followed during the patient's care in the ED.  Some ED evaluations and interventions may be delayed as a result of limited staffing during and the pandemic.*   Note:  This document was prepared using Dragon voice recognition software and may include unintentional dictation errors.   Irean Hong, MD 03/31/20 [phone]

## 2020-03-31 NOTE — Hospital Course (Addendum)
80 year old male with past medical history of hypertension, hyperlipidemia, PE with chronic hypoxic respiratory failure on 2.5 L/min oxygen at home, dementia presented to the ED on 03/31/2020 from SNF with worsening hypoxia, wheezing, and lower extremity edema.  No fevers or chills.  Patient was tachypneic in the ED and requiring 4 L/min oxygen.  BUN elevated with normal creatinine 0.74.  Normal BNP 23. Hs-troponin elevated 1263.  Normal lactic acid and unremarkable CBC.  Negative for influenza and COVID-19.  CTA chest negative for PE, showed bibasilar and right middle lobe consolidation with volume loss favoring atelectasis versus pneumonia; moderate emphysema; no pulmonary edema.  He was treated with IV fluids, IV steroid and DuoNeb in the ED.  Admitted to hospitalist service.

## 2020-03-31 NOTE — ED Notes (Signed)
Date and time results received: 03/31/20 0227  Test: Troponin Critical Value: 1,263  Name of Provider Notified: Dr. Dolores Frame   Orders Received? Or Actions Taken?: Actions Taken: Awaiting orders.

## 2020-03-31 NOTE — Progress Notes (Signed)
  PROGRESS NOTE    Kelsie Kramp  OXB:[id number] DOB: 1940/06/09 DOA: 03/31/2020  PCP: Patient, No Pcp Per    LOS - 0    Patient admitted earlier this morning after preventing from SNF with acute on chronic respiratory failure with hypoxia.  He has history of PE and at baseline requires 2.5 L/min supplemental oxygen.  CTA chest was negative for PE but showed possible right middle lobe atelectasis versus pneumonia. Patient was started on empiric Rocephin and Zithromax and cultures are pending.  Interval subjective: Patient was somnolent but briefly arousable when seen today in the ED holding for a bed.  There is a sitter at bedside who reported patient had pulled out his IV earlier.  According to nursing notes, patient had been quite restless and somewhat agitated earlier try to get out of the bed and pulling at lines etc. pulled out his IV.  Appears he was given oral Xanax a few hours before I saw him.  He wakes briefly denies shortness of breath or other acute complaints.  Exam: Somnolent but briefly arousable.  On 13 L/min HFNC oxygen.  No respiratory distress.  Lungs overall clear but diminished.  Lower extremities with trace to 1+ pitting edema, skin wrinkling consistent with improvement in edema.  Abdomen soft and nontender, without distention, positive bowel sounds   Active Problems:   NSTEMI (non-ST elevated myocardial infarction) (HCC)    I have reviewed the full H&P by Dr. Arville Care in detail, and I agree with the assessment and plan as outlined therein. In addition: --Single dose IV Lasix this morning given his edema and progressive hypoxia since admission --Monitor response and respiratory status --Continue heparin drip for now pending cardiology's recommendations   No Charge    Pennie Banter, DO [facility]   If 7PM-7AM, please contact night-coverage [email or web address] 03/31/2020, 8:35 AM

## 2020-03-31 NOTE — ED Notes (Signed)
Pt cleaned of blood, external cath replaced. Pt changed into gown, HFNC placed back onto pt. Pt calm and no distress noted. EVS called to mop floor.

## 2020-03-31 NOTE — Progress Notes (Addendum)
ANTICOAGULATION CONSULT NOTE   Pharmacy Consult for Heparin Infusion (apixaban PTA) Indication: ACS/STEMI  Allergies  Allergen Reactions  . Amlodipine     Patient Measurements: Height: 5\' 7"  (170.2 cm) Weight: 84.8 kg (187 lb) IBW/kg (Calculated) : 66.1 Heparin Dosing Weight: 83.3 kg  Vital Signs: BP: 132/89 (02/13 1230) Pulse Rate: 71 (02/13 1230)  Labs: Recent Labs    03/31/20 0104 03/31/20 0332 03/31/20 0551 03/31/20 1200  HGB 13.8 13.2  --   --   HCT 48.4 45.8  --   --   PLT 174 156  --   --   APTT  --  45*  --  62*  LABPROT  --  16.8*  --   --   INR  --  1.4*  --   --   CREATININE 0.74 0.78  --   --   TROPONINIHS 1,263*  --  1,218*  --     Estimated Creatinine Clearance: 77.9 mL/min (by C-G formula based on SCr of 0.78 mg/dL).   Medical History: Pt on Eliquis 5 mg BID PTA.-last dose 2/12 at 2000 per Med Rec  Assessment: Pt is 80 yo male c/o SOB arriving at ED via EMS with elevated troponin lvl .  Pt has  Hx of PE in Dec 21.    Goal of Therapy:  APTT 66-102 Heparin level 0.3-0.7 units/ml Monitor platelets by anticoagulation protocol: Yes   Plan:  Give 4000 unit bolus x1 Start heparin infusion at 1100 units/hr Will initially follow aPTT vs HL due to DOAC PTA Check aPTT in 8 hours then daily Check HL daily until correlates with aPTT Continue to monitor H&H and plts.  2/13 1200 aPTT=62, subtherapeutic  HL 2.56. not correlating  Will order bolus of 1200 units and increase drip to 1250 units/hr. Will recheck aPTT in 8 hrs. F/u CBC daily  And follow for HL/aPTT correlation  3/13 PharmD Clinical Pharmacist 03/31/2020

## 2020-04-01 ENCOUNTER — Encounter: Payer: Self-pay | Admitting: Family Medicine

## 2020-04-01 ENCOUNTER — Other Ambulatory Visit: Payer: Self-pay

## 2020-04-01 ENCOUNTER — Inpatient Hospital Stay (HOSPITAL_COMMUNITY)
Admit: 2020-04-01 | Discharge: 2020-04-01 | Disposition: A | Discharge status: Home / Self Care | Payer: Medicare Other | Attending: Family Medicine | Admitting: Family Medicine

## 2020-04-01 DIAGNOSIS — I214 Non-ST elevation (NSTEMI) myocardial infarction: Secondary | ICD-10-CM

## 2020-04-01 DIAGNOSIS — J9621 Acute and chronic respiratory failure with hypoxia: Secondary | ICD-10-CM | POA: Diagnosis not present

## 2020-04-01 DIAGNOSIS — R778 Other specified abnormalities of plasma proteins: Secondary | ICD-10-CM | POA: Diagnosis not present

## 2020-04-01 DIAGNOSIS — J189 Pneumonia, unspecified organism: Secondary | ICD-10-CM | POA: Diagnosis not present

## 2020-04-01 LAB — CBC
HCT: 48 % (ref 39.0–52.0)
Hemoglobin: 13.8 g/dL (ref 13.0–17.0)
MCH: 25.3 pg — ABNORMAL LOW (ref 26.0–34.0)
MCHC: 28.8 g/dL — ABNORMAL LOW (ref 30.0–36.0)
MCV: 88.1 fL (ref 80.0–100.0)
Platelets: 170 10*3/uL (ref 150–400)
RBC: 5.45 MIL/uL (ref 4.22–5.81)
RDW: 14.3 % (ref 11.5–15.5)
WBC: 9.5 10*3/uL (ref 4.0–10.5)
nRBC: 0 % (ref 0.0–0.2)

## 2020-04-01 LAB — BASIC METABOLIC PANEL
Anion gap: 7 (ref 5–15)
BUN: 41 mg/dL — ABNORMAL HIGH (ref 8–23)
CO2: 37 mmol/L — ABNORMAL HIGH (ref 22–32)
Calcium: 9.3 mg/dL (ref 8.9–10.3)
Chloride: 95 mmol/L — ABNORMAL LOW (ref 98–111)
Creatinine, Ser: 0.73 mg/dL (ref 0.61–1.24)
GFR, Estimated: >60 mL/min (ref >60)
Glucose, Bld: 114 mg/dL — ABNORMAL HIGH (ref 70–99)
Potassium: 4.5 mmol/L (ref 3.5–5.1)
Sodium: 139 mmol/L (ref 135–145)

## 2020-04-01 LAB — ECHOCARDIOGRAM COMPLETE
AR max vel: 2.13 cm2
AV Area VTI: 2.13 cm2
AV Area mean vel: 2.18 cm2
AV Mean grad: 4.5 mmHg
AV Peak grad: 7.7 mmHg
Ao pk vel: 1.39 m/s
Area-P 1/2: 3.95 cm2
Height: 67 in
S' Lateral: 2.69 cm
Weight: 2992 oz

## 2020-04-01 LAB — MRSA PCR SCREENING: MRSA by PCR: NEGATIVE

## 2020-04-01 LAB — APTT: aPTT: 76 seconds — ABNORMAL HIGH (ref 24–36)

## 2020-04-01 LAB — HEPARIN LEVEL (UNFRACTIONATED): Heparin Unfractionated: 1.74 IU/mL — ABNORMAL HIGH (ref 0.30–0.70)

## 2020-04-01 LAB — TROPONIN I (HIGH SENSITIVITY): Troponin I (High Sensitivity): 891 ng/L (ref <18)

## 2020-04-01 LAB — MAGNESIUM: Magnesium: 2.2 mg/dL (ref 1.7–2.4)

## 2020-04-01 MED ORDER — THIAMINE HCL 100 MG PO TABS
250.0000 mg | ORAL_TABLET | Freq: Every day | ORAL | Status: DC
  Administered 2020-04-01 – 2020-04-12 (×12): 250 mg via ORAL
  Filled 2020-04-01 (×12): qty 3

## 2020-04-01 MED ORDER — ORAL CARE MOUTH RINSE
15.0000 mL | Freq: Two times a day (BID) | OROMUCOSAL | Status: DC
  Administered 2020-04-01 – 2020-04-11 (×15): 15 mL via OROMUCOSAL

## 2020-04-01 MED ORDER — SODIUM CHLORIDE 0.9 % IV SOLN
INTRAVENOUS | Status: DC | PRN
  Administered 2020-04-01: 250 mL via INTRAVENOUS

## 2020-04-01 NOTE — Consult Note (Signed)
CARDIOLOGY CONSULT NOTE               Patient ID: Melvin Campbell MRN: [id number] DOB/AGE: 1940/06/02 80 y.o.  Admit date: 03/31/2020 Referring Physician [facility] Primary Physician [facility] Primary Cardiologist none Reason for Consultation Elevated troponin  HPI: 80 year old gentleman who is a resident at the [facility] with a history of dementia, hypertension, dyslipidemia, history of PE on Eliquis, and chronic respiratory failure on home O2. The patient was transported to [facility] ER for shortness of breath. Chest CTA was negative for PE, with cardiomegaly, dilated main pulmonary artery consistent with pulmonary hypertension, right middle lobe consolidation, atelectasis versus pneumonia, and aortic atherosclerosis. ECG revealed sinus rhythm at a rate of 67 bpm with prolonged PR interval, prior anterior infarct, without evidence of acute ischemia. Admission labs were notable for elevated high sensitivity troponin of 1263-> 1218-> 891, negative COVID,  BNP 23. The patient was started on IV heparin, DuoNebs, and IV steroids. The patient denies chest pain. He continues to report shortness of breath, especially conversational. He has chronic left ankle edema with intermittent left ankle edema. The patient denies a history of MI, cardiac catheterization or coronary stents.  Review of systems complete and found to be negative unless listed above      Medications Prior to Admission  Medication Sig Dispense Refill Last Dose  . acetaminophen (TYLENOL) 325 MG tablet Take 650 mg by mouth every 4 (four) hours as needed.   prn at prn  . allopurinol (ZYLOPRIM) 300 MG tablet Take 300 mg by mouth daily.   03/30/2020 at 0800  . ARTIFICIAL TEARS 1.4 % ophthalmic solution Place 1 drop into the right eye every 6 (six) hours.   03/30/2020 at 1200  . ELIQUIS 5 MG TABS tablet Take 5 mg by mouth 2 (two) times daily.   03/30/2020 at 2000  . erythromycin ophthalmic ointment Place 1 application into the right eye 3 (three) times  daily.   03/30/2020 at 2000  . escitalopram (LEXAPRO) 20 MG tablet Take 20 mg by mouth daily.   03/30/2020 at 0800  . folic acid (FOLVITE) 1 MG tablet Take 1 mg by mouth daily.   03/30/2020 at 0800  . furosemide (LASIX) 20 MG tablet Take 20 mg by mouth daily as needed.   prn at prn  . hydrochlorothiazide (HYDRODIURIL) 25 MG tablet Take 25 mg by mouth daily.   03/30/2020 at 0800  . losartan (COZAAR) 100 MG tablet Take 100 mg by mouth daily.   03/30/2020 at 0800  . magnesium oxide (MAG-OX) 400 (241.3 Mg) MG tablet Take 1 tablet by mouth 2 (two) times daily.   03/30/2020 at 2000  . metoprolol succinate (TOPROL-XL) 25 MG 24 hr tablet Take 25 mg by mouth daily.   03/30/2020 at 0800  . metoprolol succinate (TOPROL-XL) 50 MG 24 hr tablet Take 50 mg by mouth daily.   03/30/2020 at 0800  . Multiple Vitamins-Minerals (MULTI-DAY PLUS MINERALS) TABS Take 1 tablet by mouth daily.   03/30/2020 at 0800  . prednisoLONE acetate (PRED FORTE) 1 % ophthalmic suspension Administer 1 drop to the right eye two (2) times a day.   03/30/2020 at 2000  . Thiamine HCl (B-1) 100 MG TABS Take 1 tablet by mouth daily.   03/30/2020 at 0800  . albuterol (VENTOLIN HFA) 108 (90 Base) MCG/ACT inhaler Inhale 2 puffs into the lungs every 4 (four) hours as needed.   prn at prn   Social History   Socioeconomic History  . Marital status:  Single    Spouse name: Not on file  . Number of children: Not on file  . Years of education: Not on file  . Highest education level: Not on file  Occupational History  . Not on file  Tobacco Use  . Smoking status: Never Smoker  . Smokeless tobacco: Never Used  Substance and Sexual Activity  . Alcohol use: Not on file  . Drug use: Not on file  . Sexual activity: Not on file  Other Topics Concern  . Not on file  Social History Narrative  . Not on file   Social Determinants of Health   Financial Resource Strain: Not on file  Food Insecurity: Not on file  Transportation Needs: Not on file   Physical Activity: Not on file  Stress: Not on file  Social Connections: Not on file  Intimate Partner Violence: Not on file    No family history on file.    Review of systems complete and found to be negative unless listed above      PHYSICAL EXAM  General: Elderly gentleman, sitting up in bed at 60 degree angle in no acute distress HEENT:  Normocephalic and atramatic Neck:  No JVD.  Lungs: diminished basilar breath sounds, increased effort of breathing while talking, supplemental O2 via [place] Heart: HRRR . Normal S1 and S2 without gallops or murmurs.  Abdomen: nondistended Msk:  Back normal, gait not assessed. Extremities: mild left ankle edema, right ankle edema 1+ Neuro: Alert, oriented to place, person Psych:  Good affect, responds appropriately  Labs:   Lab Results  Component Value Date   WBC 9.5 04/01/2020   HGB 13.8 04/01/2020   HCT 48.0 04/01/2020   MCV 88.1 04/01/2020   PLT 170 04/01/2020    Recent Labs  Lab 04/01/20 0556  NA 139  K 4.5  CL 95*  CO2 37*  BUN 41*  CREATININE 0.73  CALCIUM 9.3  GLUCOSE 114*   No results found for: CKTOTAL, CKMB, CKMBINDEX, TROPONINI  Lab Results  Component Value Date   CHOL 148 03/31/2020   Lab Results  Component Value Date   HDL 30 (L) 03/31/2020   Lab Results  Component Value Date   LDLCALC 101 (H) 03/31/2020   Lab Results  Component Value Date   TRIG 86 03/31/2020   Lab Results  Component Value Date   CHOLHDL 4.9 03/31/2020   No results found for: LDLDIRECT    Radiology: DG Chest 2 View  Result Date: 03/31/2020 CLINICAL DATA:  Shortness of breath. EXAM: CHEST - 2 VIEW COMPARISON:  None available. FINDINGS: Low lung volumes limit assessment. Heart size upper normal. Aortic atherosclerosis and tortuosity. There are streaky bibasilar opacities. Mild diffuse peribronchial thickening. No significant pleural effusion. No pneumothorax. Degenerative change throughout the spine. Right shoulder arthroplasty. No  acute osseous abnormalities are seen. IMPRESSION: 1. Low lung volumes with streaky bibasilar opacities, favoring atelectasis. Mild diffuse peribronchial thickening. 2. Diffuse aortic atherosclerosis. Aortic tortuosity. Aortic Atherosclerosis (ICD10-I70.0). Electronically Signed   By: Narda Rutherford M.D.   On: 03/31/2020 01:42   CT Angio Chest PE W/Cm &/Or Wo Cm  Result Date: 03/31/2020 CLINICAL DATA:  PE suspected, high prob PE in 01/2020; hypoxic EXAM: CT ANGIOGRAPHY CHEST WITH CONTRAST TECHNIQUE: Multidetector CT imaging of the chest was performed using the standard protocol during bolus administration of intravenous contrast. Multiplanar CT image reconstructions and MIPs were obtained to evaluate the vascular anatomy. CONTRAST:  OMNIPAQUE IOHEXOL 350 MG/ML SOLN COMPARISON:  Chest radiograph earlier  today.  No prior CT available. FINDINGS: Cardiovascular: No evidence of acute or chronic pulmonary embolus. There are no intraluminal pulmonary arterial filling defects. No intraluminal webs. Dilated main pulmonary artery at 4 cm. Multi chamber cardiomegaly. Coronary artery calcifications. Aortic atherosclerosis and tortuosity. Cannot assess for dissection given phase of contrast tailored to pulmonary artery evaluation. No pericardial effusion. Mediastinum/Nodes: Prominent right lower paratracheal node measures 10 mm. 10 mm left hilar node. No visualized thyroid nodule. Patulous esophagus. Lungs/Pleura: Bibasilar and dependent right middle lobe consolidation. Bibasilar volume loss. Moderate emphysema. No septal thickening or pulmonary edema no significant pleural effusion. Upper Abdomen: Left perinephric edema is partially included, nonspecific. Lobulated splenic contours. Musculoskeletal: Diffuse thoracic spondylosis. Subacute or remote lower lateral left rib fractures with callus formation. Right shoulder arthroplasty. Review of the MIP images confirms the above findings. IMPRESSION: 1. No pulmonary  embolus. Cardiomegaly. Dilated main pulmonary artery consistent with pulmonary arterial hypertension. 2. Bibasilar and dependent right middle lobe consolidation with volume loss. Atelectasis is favored, however sterility is indeterminate by imaging, recommend correlation for pneumonia symptoms. 3. Nonspecific left perinephric edema in the upper abdomen is partially included, may be chronic or seen with urinary tract infection. 4. Aortic atherosclerosis and tortuosity. 5. Moderate emphysema Aortic Atherosclerosis (ICD10-I70.0) and Emphysema (ICD10-J43.9). Electronically Signed   By: Narda Rutherford M.D.   On: 03/31/2020 03:43    EKG: sinus rhythm, 67 bpm, prolonged PR interval, prior anterior infarct  ASSESSMENT AND PLAN:  1. Elevated troponin, (1263-> 1218-> 891) in the absence of chest pain, presenting in respiratory distress. 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 2. Acute hypoxemia with acute on chronic hypoxic respiratory failure; chest CTA negative for PE, with right middle lobe atelectasis versus pneumonia. Patient on chronic O2 at home. 3. Hypertension 4. 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 Continue heparin drip for now 2. Review 2D echocardiogram 3. Continue aspirin, statin, HCTZ, losartan, and metoprolol succinate 4. Further recommendations pending patient's initial course  Signed: Leanora Ivanoff PA-C 04/01/2020, 10:03 AM

## 2020-04-01 NOTE — Progress Notes (Signed)
*  PRELIMINARY RESULTS* Echocardiogram 2D Echocardiogram has been performed.  Melvin Campbell 04/01/2020, 8:52 AM

## 2020-04-01 NOTE — Progress Notes (Signed)
PROGRESS NOTE    Melvin Campbell   ZOX:[id number]  DOB: 1940-04-22  PCP: Patient, No Pcp Per    DOA: 03/31/2020 LOS: 1   Brief Narrative   80 year old male with past medical history of hypertension, hyperlipidemia, PE with chronic hypoxic respiratory failure on 2.5 L/min oxygen at home, dementia presented to the ED on 03/31/2020 from SNF with worsening hypoxia, wheezing, and lower extremity edema.  No fevers or chills.  Patient was tachypneic in the ED and requiring 4 L/min oxygen.  BUN elevated with normal creatinine 0.74.  Normal BNP 23. Hs-troponin elevated 1263.  Normal lactic acid and unremarkable CBC.  Negative for influenza and COVID-19.  CTA chest negative for PE, showed bibasilar and right middle lobe consolidation with volume loss favoring atelectasis versus pneumonia; moderate emphysema; no pulmonary edema.  He was treated with IV fluids, IV steroid and DuoNeb in the ED.  Admitted to hospitalist service.    Assessment & Plan   Principal Problem:   Acute on chronic respiratory failure with hypoxia (HCC) Active Problems:   Elevated troponin   Community acquired pneumonia   Bilateral lower extremity edema   Acute on chronic respiratory failure with hypoxia secondary to community-acquired pneumonia -  CTA chest was negative for PE showed possible right middle lobe infiltrate versus atelectasis. Continue Rocephin and Zithromax Continue supplemental oxygen, wean as tolerated, maintain O2 sat at or above 88% Supportive care with mucolytic's, bronchodilator Follow-up on cultures  Elevated troponin - secondary to demand ischemia in the setting of hypoxia and pneumonia. Patient without chest pain or acute ischemic changes on EKG. He was initially treated empirically with IV heparin.  Cardiology was consulted. Echo obtained -LVEF 60 to 65%, moderate LVH, grade 2 diastolic dysfunction. --Per cardiology, continue heparin drip for now  History of PE -home Eliquis is on hold.  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  Suspect underlying dementia vs Wernicke's.  Start thiamine.  Check thiamine level, B12, RPR.  Essential hypertension -continue Toprol-XL, Cozaar, HCTZ  Depression -continue Lexapro  Gout -not acutely flared.  Continue allopurinol.   Patient BMI: Body mass index is 29.29 kg/m.     DVT prophylaxis: On heparin infusion   Diet:  Diet Orders (From admission, onward)    Start     Ordered   03/31/20 0321  Diet NPO time specified  Diet effective now        03/31/20 0324            Code Status: Full Code    Subjective 04/01/20    Patient seen awake sitting up in bed today.  States he feels good.  Does endorse shortness of breath with conversation that is been going on for a long time.  Says it has been worse recently.  Denies fevers or chills.  No other acute complaints.   Disposition Plan & Communication   Status is: Inpatient  Remains inpatient appropriate because:IV treatments appropriate due to intensity of illness or inability to take PO   Dispo: The patient is from: ALF              Anticipated d/c is to: ALF vs ?SNF              Anticipated d/c date is: 3 days  Patient currently is not medically stable to d/c.   Difficult to place patient No   Family Communication: spoke with daughter, Tobi Bastos, by phone this afternoon 2/14.  She reports patient has had worsening memory issues since going to ALF after long [facility] admission.   Consults, Procedures, Significant Events   Consultants:   Cardiology  Procedures:   None  Antimicrobials:  Anti-infectives (From admission, onward)   Start     Dose/Rate Route Frequency Ordered Stop   03/31/20 0500  cefTRIAXone (ROCEPHIN) 2 g in sodium chloride 0.9 % 100 mL IVPB        2 g 200 mL/hr  over 30 Minutes Intravenous Every 24 hours 03/31/20 0445 04/05/20 0459   03/31/20 0500  azithromycin (ZITHROMAX) 500 mg in sodium chloride 0.9 % 250 mL IVPB        500 mg 250 mL/hr over 60 Minutes Intravenous Every 24 hours 03/31/20 0445 04/05/20 0459        Micro    Objective   Vitals:   04/01/20 0938 04/01/20 1141 04/01/20 1203 04/01/20 1539  BP: 127/68  122/65 135/77  Pulse: 63  70 60  Resp: 20     Temp: (!) 97.3 F (36.3 C)  97.7 F (36.5 C) 97.6 F (36.4 C)  TempSrc: Oral  Oral Oral  SpO2: 96% 92% 100% 95%  Weight:      Height:        Intake/Output Summary (Last 24 hours) at 04/01/2020 1811 Last data filed at 04/01/2020 0555 Gross per 24 hour  Intake 320.23 ml  Output --  Net 320.23 ml   Filed Weights   03/31/20 0137  Weight: 84.8 kg    Physical Exam:  General exam: awake, alert, no acute distress HEENT: moist mucus membranes, hearing grossly normal  Respiratory system: CTAB diminished bases, no wheezes, mildly increased respiratory effort with conversational dyspnea at rest, on 13 L/min HFNC oxygen Cardiovascular system: normal S1/S2, RRR, no pedal edema.   Gastrointestinal system: soft, NT, ND, no HSM felt, +bowel sounds. Central nervous system: no gross focal neurologic deficits, normal speech Extremities: moves all, no cyanosis, normal tone Psychiatry: normal mood, congruent affect  Labs   Data Reviewed: I have personally reviewed following labs and imaging studies  CBC: Recent Labs  Lab 03/31/20 0104 03/31/20 0332 04/01/20 0556  WBC 9.7 8.5 9.5  HGB 13.8 13.2 13.8  HCT 48.4 45.8 48.0  MCV 87.1 87.4 88.1  PLT 174 156 170   Basic Metabolic Panel: Recent Labs  Lab 03/31/20 0104 03/31/20 0332 04/01/20 0556  NA 139 140 139  K 3.8 3.8 4.5  CL 96* 94* 95*  CO2 36* 38* 37*  GLUCOSE 119* 105* 114*  BUN 39* 40* 41*  CREATININE 0.74 0.78 0.73  CALCIUM 9.1 9.1 9.3  MG  --   --  2.2   GFR: Estimated Creatinine Clearance: 77.9 mL/min (by  C-G formula based on SCr of 0.73 mg/dL). Liver Function Tests: No results for input(s): AST, ALT, ALKPHOS, BILITOT, PROT, ALBUMIN in the last 168 hours. No results for input(s): LIPASE, AMYLASE in the last 168 hours. No results for input(s): AMMONIA in the last 168 hours. Coagulation Profile: Recent Labs  Lab 03/31/20 0332  INR 1.4*   Cardiac Enzymes: No results for input(s): CKTOTAL, CKMB, CKMBINDEX, TROPONINI in the last 168 hours. BNP (last 3 results) No results for input(s): PROBNP in the last 8760 hours. HbA1C: No results for input(s): HGBA1C in the last 72 hours. CBG: No results  for input(s): GLUCAP in the last 168 hours. Lipid Profile: Recent Labs    03/31/20 0332  CHOL 148  HDL 30*  LDLCALC 101*  TRIG 86  CHOLHDL 4.9   Thyroid Function Tests: No results for input(s): TSH, T4TOTAL, FREET4, T3FREE, THYROIDAB in the last 72 hours. Anemia Panel: No results for input(s): VITAMINB12, FOLATE, FERRITIN, TIBC, IRON, RETICCTPCT in the last 72 hours. Sepsis Labs: Recent Labs  Lab 03/31/20 0101 03/31/20 0154  PROCALCITON <0.10  --   LATICACIDVEN  --  1.0    Recent Results (from the past 240 hour(s))  Resp Panel by RT-PCR (Flu A&B, Covid) Nasopharyngeal Swab     Status: None   Collection Time: 03/31/20  1:28 AM   Specimen: Nasopharyngeal Swab; Nasopharyngeal(NP) swabs in vial transport medium  Result Value Ref Range Status   SARS Coronavirus 2 by RT PCR NEGATIVE NEGATIVE Final    Comment: (NOTE) SARS-CoV-2 target nucleic acids are NOT DETECTED.  The SARS-CoV-2 RNA is generally detectable in upper respiratory specimens during the acute phase of infection. The lowest concentration of SARS-CoV-2 viral copies this assay can detect is 138 copies/mL. A negative result does not preclude SARS-Cov-2 infection and should not be used as the sole basis for treatment or other patient management decisions. A negative result may occur with  improper specimen collection/handling,  submission of specimen other than nasopharyngeal swab, presence of viral mutation(s) within the areas targeted by this assay, and inadequate number of viral copies(<138 copies/mL). A negative result must be combined with clinical observations, patient history, and epidemiological information. The expected result is Negative.  Fact Sheet for Patients:  BloggerCourse.com  Fact Sheet for Healthcare Providers:  SeriousBroker.it  This test is no t yet approved or cleared by the [place] FDA and  has been authorized for detection and/or diagnosis of SARS-CoV-2 by FDA under an Emergency Use Authorization (EUA). This EUA will remain  in effect (meaning this test can be used) for the duration of the COVID-19 declaration under Section 564(b)(1) of the Act, 21 U.S.C.section 360bbb-3(b)(1), unless the authorization is terminated  or revoked sooner.       Influenza A by PCR NEGATIVE NEGATIVE Final   Influenza B by PCR NEGATIVE NEGATIVE Final    Comment: (NOTE) The Xpert Xpress SARS-CoV-2/FLU/RSV plus assay is intended as an aid in the diagnosis of influenza from Nasopharyngeal swab specimens and should not be used as a sole basis for treatment. Nasal washings and aspirates are unacceptable for Xpert Xpress SARS-CoV-2/FLU/RSV testing.  Fact Sheet for Patients: BloggerCourse.com  Fact Sheet for Healthcare Providers: SeriousBroker.it  This test is not yet approved or cleared by the [place] FDA and has been authorized for detection and/or diagnosis of SARS-CoV-2 by FDA under an Emergency Use Authorization (EUA). This EUA will remain in effect (meaning this test can be used) for the duration of the COVID-19 declaration under Section 564(b)(1) of the Act, 21 U.S.C. section 360bbb-3(b)(1), unless the authorization is terminated or revoked.  Performed at [facility], [address] [place], [place] [address]   Culture, blood (routine x 2)     Status: None (Preliminary result)   Collection Time: 03/31/20  1:54 AM   Specimen: BLOOD  Result Value Ref Range Status   Specimen Description BLOOD RIGHT ANTECUBITAL  Final   Special Requests   Final    BOTTLES DRAWN AEROBIC AND ANAEROBIC Blood Culture adequate volume   Culture   Final    NO GROWTH 1 DAY Performed  at [facility], [address] [place], [place] [address]    Report Status PENDING  Incomplete  Culture, blood (routine x 2)     Status: None (Preliminary result)   Collection Time: 03/31/20  1:55 AM   Specimen: BLOOD  Result Value Ref Range Status   Specimen Description BLOOD RIGHT ANTECUBITAL  Final   Special Requests   Final    BOTTLES DRAWN AEROBIC AND ANAEROBIC Blood Culture adequate volume   Culture   Final    NO GROWTH 1 DAY Performed at [facility], [address] [place], [place] [address]    Report Status PENDING  Incomplete  MRSA PCR Screening     Status: None   Collection Time: 04/01/20  4:00 AM   Specimen: Nasopharyngeal  Result Value Ref Range Status   MRSA by PCR NEGATIVE NEGATIVE Final    Comment:        The GeneXpert MRSA Assay (FDA approved for NASAL specimens only), is one component of a comprehensive MRSA colonization surveillance program. It is not intended to diagnose MRSA infection nor to guide or monitor treatment for MRSA infections. Performed at [facility], [address] [place], [place] [address]       Imaging Studies   DG Chest 2 View  Result Date: 03/31/2020 CLINICAL DATA:  Shortness of breath. EXAM: CHEST - 2 VIEW COMPARISON:  None available. FINDINGS: Low lung volumes limit assessment. Heart size upper normal. Aortic atherosclerosis and tortuosity. There are streaky bibasilar opacities. Mild diffuse peribronchial thickening. No significant pleural effusion. No pneumothorax. Degenerative change throughout the spine.  Right shoulder arthroplasty. No acute osseous abnormalities are seen. IMPRESSION: 1. Low lung volumes with streaky bibasilar opacities, favoring atelectasis. Mild diffuse peribronchial thickening. 2. Diffuse aortic atherosclerosis. Aortic tortuosity. Aortic Atherosclerosis (ICD10-I70.0). Electronically Signed   By: Narda RutherfordMelanie  Sanford M.D.   On: 03/31/2020 01:42   CT Angio Chest PE W/Cm &/Or Wo Cm  Result Date: 03/31/2020 CLINICAL DATA:  PE suspected, high prob PE in 01/2020; hypoxic EXAM: CT ANGIOGRAPHY CHEST WITH CONTRAST TECHNIQUE: Multidetector CT imaging of the chest was performed using the standard protocol during bolus administration of intravenous contrast. Multiplanar CT image reconstructions and MIPs were obtained to evaluate the vascular anatomy. CONTRAST:  100mL OMNIPAQUE IOHEXOL 350 MG/ML SOLN COMPARISON:  Chest radiograph earlier today.  No prior CT available. FINDINGS: Cardiovascular: No evidence of acute or chronic pulmonary embolus. There are no intraluminal pulmonary arterial filling defects. No intraluminal webs. Dilated main pulmonary artery at 4 cm. Multi chamber cardiomegaly. Coronary artery calcifications. Aortic atherosclerosis and tortuosity. Cannot assess for dissection given phase of contrast tailored to pulmonary artery evaluation. No pericardial effusion. Mediastinum/Nodes: Prominent right lower paratracheal node measures 10 mm. 10 mm left hilar node. No visualized thyroid nodule. Patulous esophagus. Lungs/Pleura: Bibasilar and dependent right middle lobe consolidation. Bibasilar volume loss. Moderate emphysema. No septal thickening or pulmonary edema no significant pleural effusion. Upper Abdomen: Left perinephric edema is partially included, nonspecific. Lobulated splenic contours. Musculoskeletal: Diffuse thoracic spondylosis. Subacute or remote lower lateral left rib fractures with callus formation. Right shoulder arthroplasty. Review of the MIP images confirms the above findings.  IMPRESSION: 1. No pulmonary embolus. Cardiomegaly. Dilated main pulmonary artery consistent with pulmonary arterial hypertension. 2. Bibasilar and dependent right middle lobe consolidation with volume loss. Atelectasis is favored, however sterility is indeterminate by imaging, recommend correlation for pneumonia symptoms. 3. Nonspecific left perinephric edema in the upper abdomen is partially included, may be chronic or seen with  urinary tract infection. 4. Aortic atherosclerosis and tortuosity. 5. Moderate emphysema Aortic Atherosclerosis (ICD10-I70.0) and Emphysema (ICD10-J43.9). Electronically Signed   By: Narda Rutherford M.D.   On: 03/31/2020 03:43   ECHOCARDIOGRAM COMPLETE  Result Date: 04/01/2020    ECHOCARDIOGRAM REPORT   Patient Name:   MANAN OLMO Date of Exam: 04/01/2020 Medical Rec #:  [id number]        Height:       67.0 in Accession #:    [id number]       Weight:       187.0 lb Date of Birth:  Sep 23, 1940         BSA:          1.965 m Patient Age:    79 years         BP:           125/61 mmHg Patient Gender: M                HR:           58 bpm. Exam Location:  [facility] Procedure: 2D Echo, Cardiac Doppler and Color Doppler Indications:     NSTEMI I21.4  History:         Patient has no prior history of Echocardiogram examinations. No                  medical history on file.  Sonographer:     Cristela Blue RDCS (AE) Referring Phys:  [id number] Vernetta Honey MANSY Diagnosing Phys: Lorine Bears MD  Sonographer Comments: Suboptimal apical window. IMPRESSIONS  1. Left ventricular ejection fraction, by estimation, is 60 to 65%. The left ventricle has normal function. The left ventricle has no regional wall motion abnormalities. There is moderate left ventricular hypertrophy. Left ventricular diastolic parameters are consistent with Grade II diastolic dysfunction (pseudonormalization).  2. Right ventricular systolic function is normal. The right ventricular size is normal. Tricuspid regurgitation signal is inadequate  for assessing PA pressure.  3. Left atrial size was mildly dilated.  4. The mitral valve is normal in structure. No evidence of mitral valve regurgitation. No evidence of mitral stenosis. Moderate mitral annular calcification.  5. The aortic valve is normal in structure. Aortic valve regurgitation is not visualized. Mild aortic valve stenosis.  6. Aortic dilatation noted. There is mild dilatation of the ascending aorta, measuring 41 mm.  7. The inferior vena cava is dilated in size with <50% respiratory variability, suggesting right atrial pressure of 15 mmHg. FINDINGS  Left Ventricle: Left ventricular ejection fraction, by estimation, is 60 to 65%. The left ventricle has normal function. The left ventricle has no regional wall motion abnormalities. The left ventricular internal cavity size was normal in size. There is  moderate left ventricular hypertrophy. Left ventricular diastolic parameters are consistent with Grade II diastolic dysfunction (pseudonormalization). Right Ventricle: The right ventricular size is normal. No increase in right ventricular wall thickness. Right ventricular systolic function is normal. Tricuspid regurgitation signal is inadequate for assessing PA pressure. Left Atrium: Left atrial size was mildly dilated. Right Atrium: Right atrial size was normal in size. Pericardium: There is no evidence of pericardial effusion. Mitral Valve: The mitral valve is normal in structure. Moderate mitral annular calcification. No evidence of mitral valve regurgitation. No evidence of mitral valve stenosis. Tricuspid Valve: The tricuspid valve is normal in structure. Tricuspid valve regurgitation is not demonstrated. No evidence of tricuspid stenosis. Aortic Valve: The aortic valve is normal in structure. Aortic valve regurgitation is not visualized.  Mild aortic stenosis is present. Aortic valve mean gradient measures 4.5 mmHg. Aortic valve peak gradient measures 7.7 mmHg. Aortic valve area, by VTI measures  2.13 cm. Pulmonic Valve: The pulmonic valve was normal in structure. Pulmonic valve regurgitation is not visualized. No evidence of pulmonic stenosis. Aorta: The aortic root is normal in size and structure and aortic dilatation noted. There is mild dilatation of the ascending aorta, measuring 41 mm. Venous: The inferior vena cava is dilated in size with less than 50% respiratory variability, suggesting right atrial pressure of 15 mmHg. IAS/Shunts: No atrial level shunt detected by color flow Doppler.  LEFT VENTRICLE PLAX 2D LVIDd:         4.14 cm  Diastology LVIDs:         2.69 cm  LV e' medial:    4.24 cm/s LV PW:         1.14 cm  LV E/e' medial:  22.6 LV IVS:        1.16 cm  LV e' lateral:   4.57 cm/s LVOT diam:     2.20 cm  LV E/e' lateral: 21.0 LV SV:         73 LV SV Index:   37 LVOT Area:     3.80 cm  RIGHT VENTRICLE RV Basal diam:  3.37 cm RV S prime:     9.46 cm/s TAPSE (M-mode): 3.8 cm LEFT ATRIUM             Index       RIGHT ATRIUM           Index LA diam:        3.90 cm 1.98 cm/m  RA Area:     10.60 cm LA Vol (A2C):   78.4 ml 39.89 ml/m RA Volume:   20.70 ml  10.53 ml/m LA Vol (A4C):   70.5 ml 35.87 ml/m LA Biplane Vol: 79.9 ml 40.66 ml/m  AORTIC VALVE                    PULMONIC VALVE AV Area (Vmax):    2.13 cm     PV Vmax:        0.69 m/s AV Area (Vmean):   2.18 cm     PV Peak grad:   1.9 mmHg AV Area (VTI):     2.13 cm     RVOT Peak grad: 2 mmHg AV Vmax:           138.50 cm/s AV Vmean:          101.500 cm/s AV VTI:            0.342 m AV Peak Grad:      7.7 mmHg AV Mean Grad:      4.5 mmHg LVOT Vmax:         77.70 cm/s LVOT Vmean:        58.300 cm/s LVOT VTI:          0.192 m LVOT/AV VTI ratio: 0.56  AORTA Ao Root diam: 4.00 cm MITRAL VALVE                TRICUSPID VALVE MV Area (PHT): 3.95 cm     TR Peak grad:   8.4 mmHg MV Decel Time: 192 msec     TR Vmax:        145.00 cm/s MV E velocity: 96.00 cm/s MV A velocity: 113.00 cm/s  SHUNTS MV E/A ratio:  0.85  Systemic VTI:  0.19 m                              Systemic Diam: 2.20 cm Lorine Bears MD Electronically signed by Lorine Bears MD Signature Date/Time: 04/01/2020/10:36:14 AM    Final      Medications   Scheduled Meds: . allopurinol  300 mg Oral Daily  . aspirin EC  325 mg Oral Daily  . atorvastatin  80 mg Oral Daily  . erythromycin  1 application Right Eye TID  . escitalopram  20 mg Oral Daily  . folic acid  1 mg Oral Daily  . guaiFENesin  600 mg Oral BID  . hydrochlorothiazide  25 mg Oral Daily  . ipratropium-albuterol  3 mL Nebulization QID  . losartan  100 mg Oral Daily  . magnesium oxide  400 mg Oral BID  . mouth rinse  15 mL Mouth Rinse BID  . metoprolol succinate  75 mg Oral Daily  . multivitamin with minerals  1 tablet Oral Daily  . polyvinyl alcohol  1 drop Right Eye Q6H  . prednisoLONE acetate  1 drop Right Eye BID  . thiamine  100 mg Oral Daily   Continuous Infusions: . sodium chloride 250 mL (04/01/20 0520)  . azithromycin 500 mg (04/01/20 0602)  . cefTRIAXone (ROCEPHIN)  IV 2 g (04/01/20 0522)  . heparin 1,250 Units/hr (03/31/20 2138)       LOS: 1 day    Time spent: 30 minutes with greater than 50% spent at bedside and in coordination of care    Pennie Banter, DO [facility]  04/01/2020, 6:11 PM      If 7PM-7AM, please contact night-coverage. How to contact the [facility] Attending or Consulting provider 7A - 7P or covering provider during after hours 7P -7A, for this patient?    1. Check the care team in [facility] and look for a) attending/consulting [facility] provider listed and b) the [facility] team listed 2. Log into [email or web address] and use [facility] universal password to access. If you do not have the password, please contact the hospital operator. 3. Locate the [facility] provider you are looking for under [facility] and page to a number that you can be directly reached. 4. If you still have difficulty reaching the provider, please page the [facility] (Director on Call) for the  Hospitalists listed on amion for assistance.

## 2020-04-01 NOTE — Progress Notes (Signed)
ANTICOAGULATION CONSULT NOTE   Pharmacy Consult for Heparin Infusion (apixaban PTA) Indication: ACS/STEMI  Allergies  Allergen Reactions  . Amlodipine     Patient Measurements: Height: 5\' 7"  (170.2 cm) Weight: 84.8 kg (187 lb) IBW/kg (Calculated) : 66.1 Heparin Dosing Weight: 83.3 kg  Vital Signs: Temp: 97.5 F (36.4 C) (02/14 0356) Temp Source: Oral (02/13 2243) BP: 125/61 (02/14 0356) Pulse Rate: 58 (02/14 0356)  Labs: Recent Labs    03/31/20 0104 03/31/20 0104 03/31/20 0332 03/31/20 0551 03/31/20 1200 03/31/20 2038 04/01/20 0556  HGB 13.8  --  13.2  --   --   --  13.8  HCT 48.4  --  45.8  --   --   --  48.0  PLT 174  --  156  --   --   --  170  APTT  --    < > 45*  --  62* 84* 76*  LABPROT  --   --  16.8*  --   --   --   --   INR  --   --  1.4*  --   --   --   --   HEPARINUNFRC  --   --   --   --  2.56*  --   --   CREATININE 0.74  --  0.78  --   --   --   --   TROPONINIHS 1,263*  --   --  1,218*  --   --   --    < > = values in this interval not displayed.    Estimated Creatinine Clearance: 77.9 mL/min (by C-G formula based on SCr of 0.78 mg/dL).   Medical History: Pt on Eliquis 5 mg BID PTA.-last dose 2/12 at 2000 per Med Rec  Assessment: Pt is 80 yo male c/o SOB arriving at ED via EMS with elevated troponin lvl .  Pt has  Hx of PE in Dec 21.    Goal of Therapy:  APTT 66-102 Heparin level 0.3-0.7 units/ml Monitor platelets by anticoagulation protocol: Yes  2/13 1200 aPTT=62, subtherapeutic  HL 2.56 2/13 2038 aPTT=84, therapeutic 2/14 0556 aPTT=76, therapeutic x 2, HL - "Active"   Plan:  Continue heparin infusion at 1250 units/hr. Will initially follow aPTT vs HL due to DOAC PTA.  Will recheck aPTT and HL with AM labs F/u CBC daily and follow for HL/aPTT correlation. Continue to monitor H&H and plts.  3/14, PharmD, [facility] 04/01/2020 6:24 AM

## 2020-04-02 DIAGNOSIS — R778 Other specified abnormalities of plasma proteins: Secondary | ICD-10-CM | POA: Diagnosis not present

## 2020-04-02 DIAGNOSIS — J9621 Acute and chronic respiratory failure with hypoxia: Secondary | ICD-10-CM | POA: Diagnosis not present

## 2020-04-02 DIAGNOSIS — J189 Pneumonia, unspecified organism: Secondary | ICD-10-CM | POA: Diagnosis not present

## 2020-04-02 LAB — BASIC METABOLIC PANEL WITH GFR
Anion gap: 11 (ref 5–15)
BUN: 34 mg/dL — ABNORMAL HIGH (ref 8–23)
CO2: 39 mmol/L — ABNORMAL HIGH (ref 22–32)
Calcium: 9.5 mg/dL (ref 8.9–10.3)
Chloride: 93 mmol/L — ABNORMAL LOW (ref 98–111)
Creatinine, Ser: 0.68 mg/dL (ref 0.61–1.24)
GFR, Estimated: >60 mL/min
Glucose, Bld: 68 mg/dL — ABNORMAL LOW (ref 70–99)
Potassium: 4.3 mmol/L (ref 3.5–5.1)
Sodium: 143 mmol/L (ref 135–145)

## 2020-04-02 LAB — CBC
HCT: 49 % (ref 39.0–52.0)
Hemoglobin: 13.7 g/dL (ref 13.0–17.0)
MCH: 24.8 pg — ABNORMAL LOW (ref 26.0–34.0)
MCHC: 28 g/dL — ABNORMAL LOW (ref 30.0–36.0)
MCV: 88.8 fL (ref 80.0–100.0)
Platelets: 196 10*3/uL (ref 150–400)
RBC: 5.52 MIL/uL (ref 4.22–5.81)
RDW: 14.4 % (ref 11.5–15.5)
WBC: 8.7 10*3/uL (ref 4.0–10.5)
nRBC: 0 % (ref 0.0–0.2)

## 2020-04-02 LAB — HEPARIN LEVEL (UNFRACTIONATED): Heparin Unfractionated: 1.16 IU/mL — ABNORMAL HIGH (ref 0.30–0.70)

## 2020-04-02 LAB — RPR: RPR Ser Ql: NONREACTIVE

## 2020-04-02 LAB — TROPONIN I (HIGH SENSITIVITY): Troponin I (High Sensitivity): 1060 ng/L

## 2020-04-02 LAB — VITAMIN B12: Vitamin B-12: 231 pg/mL (ref 180–914)

## 2020-04-02 LAB — APTT: aPTT: 59 seconds — ABNORMAL HIGH (ref 24–36)

## 2020-04-02 MED ORDER — IPRATROPIUM-ALBUTEROL 0.5-2.5 (3) MG/3ML IN SOLN
3.0000 mL | Freq: Four times a day (QID) | RESPIRATORY_TRACT | Status: DC
  Administered 2020-04-02 – 2020-04-03 (×3): 3 mL via RESPIRATORY_TRACT
  Filled 2020-04-02 (×3): qty 3

## 2020-04-02 MED ORDER — APIXABAN 5 MG PO TABS
5.0000 mg | ORAL_TABLET | Freq: Two times a day (BID) | ORAL | Status: DC
  Administered 2020-04-02 – 2020-04-12 (×20): 5 mg via ORAL
  Filled 2020-04-02 (×20): qty 1

## 2020-04-02 MED ORDER — ASPIRIN EC 81 MG PO TBEC
81.0000 mg | DELAYED_RELEASE_TABLET | Freq: Every day | ORAL | Status: DC
  Administered 2020-04-03 – 2020-04-12 (×10): 81 mg via ORAL
  Filled 2020-04-02 (×10): qty 1

## 2020-04-02 MED ORDER — BUDESONIDE 0.25 MG/2ML IN SUSP
0.2500 mg | Freq: Two times a day (BID) | RESPIRATORY_TRACT | Status: DC
  Administered 2020-04-02 – 2020-04-12 (×20): 0.25 mg via RESPIRATORY_TRACT
  Filled 2020-04-02 (×21): qty 2

## 2020-04-02 MED ORDER — ALBUTEROL SULFATE (2.5 MG/3ML) 0.083% IN NEBU: 2.5000 mg | INHALATION_SOLUTION | RESPIRATORY_TRACT | Status: DC | PRN

## 2020-04-02 NOTE — Progress Notes (Signed)
PROGRESS NOTE    Melvin Campbell   ZOX:[id number]  DOB: 09/14/1940  PCP: Patient, No Pcp Per    DOA: 03/31/2020 LOS: 2   Brief Narrative   80 year old male with past medical history of hypertension, hyperlipidemia, PE with chronic hypoxic respiratory failure on 2.5 L/min oxygen at home, dementia presented to the ED on 03/31/2020 from SNF with worsening hypoxia, wheezing, and lower extremity edema.  No fevers or chills.  Patient was tachypneic in the ED and requiring 4 L/min oxygen.  BUN elevated with normal creatinine 0.74.  Normal BNP 23. Hs-troponin elevated 1263.  Normal lactic acid and unremarkable CBC.  Negative for influenza and COVID-19.  CTA chest negative for PE, showed bibasilar and right middle lobe consolidation with volume loss favoring atelectasis versus pneumonia; moderate emphysema; no pulmonary edema.  He was treated with IV fluids, IV steroid and DuoNeb in the ED.  Admitted to hospitalist service.    Assessment & Plan   Principal Problem:   Acute on chronic respiratory failure with hypoxia (HCC) Active Problems:   Elevated troponin   Community acquired pneumonia   Bilateral lower extremity edema   Acute on chronic respiratory failure with hypoxia secondary to community-acquired pneumonia -  CTA chest was negative for PE showed possible right middle lobe infiltrate versus atelectasis. Continue Rocephin and Zithromax Continue supplemental oxygen, wean as tolerated, maintain O2 sat at or above 88% Supportive care with mucolytic's, bronchodilator Follow-up on cultures  Elevated troponin - secondary to demand ischemia in the setting of hypoxia and pneumonia. Patient without chest pain or acute ischemic changes on EKG. He was initially treated empirically with IV heparin.  Cardiology was consulted. Echo obtained -LVEF 60 to 65%, moderate LVH, grade 2 diastolic dysfunction. --Stop heparin, resume Eliquis  History of PE -home Eliquis is on hold.  On heparin  drip.  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  Suspect underlying dementia vs Wernicke's.  Started thiamine.  Check thiamine level, B12, RPR.  Essential hypertension -continue Toprol-XL, Cozaar, HCTZ  HLD - on statin  Depression -continue Lexapro  Gout -not acutely flared.  Continue allopurinol.   Patient BMI: Body mass index is 29.29 kg/m.     DVT prophylaxis: On heparin infusion   Diet:  Diet Orders (From admission, onward)    Start     Ordered   04/01/20 2042  Diet heart healthy/carb modified Room service appropriate? Yes; Fluid consistency: Thin  Diet effective now       Question Answer Comment  Diet-HS Snack? Nothing   Room service appropriate? Yes   Fluid consistency: Thin      04/01/20 2041            Code Status: Full Code    Subjective 04/02/20    Patient awake, sitting up in bed.  Says feeling better today.  No shortness of breath, fever or chills.  Chest pain.  No other complaints.   Disposition Plan & Communication   Status is: Inpatient  Remains inpatient appropriate because:IV treatments appropriate due to intensity of illness or inability to take PO   Dispo: The patient is from: ALF              Anticipated d/c is to: ALF vs ?SNF              Anticipated  d/c date is: 3 days              Patient currently is not medically stable to d/c.   Difficult to place patient No   Family Communication: spoke with daughter, Tobi Bastos, by phone this afternoon 2/14.  She reports patient has had worsening memory issues since going to ALF after long [facility] admission. For summary of recent [facility] admission, see Care Everywhere Office Visit Note of 01/31/2020 by Kyra Leyland NP.   Consults, Procedures, Significant Events   Consultants:   Cardiology  Procedures:   None  Antimicrobials:   Anti-infectives (From admission, onward)   Start     Dose/Rate Route Frequency Ordered Stop   03/31/20 0500  cefTRIAXone (ROCEPHIN) 2 g in sodium chloride 0.9 % 100 mL IVPB        2 g 200 mL/hr over 30 Minutes Intravenous Every 24 hours 03/31/20 0445 04/05/20 0459   03/31/20 0500  azithromycin (ZITHROMAX) 500 mg in sodium chloride 0.9 % 250 mL IVPB        500 mg 250 mL/hr over 60 Minutes Intravenous Every 24 hours 03/31/20 0445 04/05/20 0459        Micro    Objective   Vitals:   04/02/20 0517 04/02/20 0850 04/02/20 1020 04/02/20 1156  BP: 117/72 140/68  138/74  Pulse: 70 76  80  Resp: 20 16  18   Temp: 98.3 F (36.8 C) 97.7 F (36.5 C)  98.1 F (36.7 C)  TempSrc: Oral Oral  Oral  SpO2: 99% 90% (!) 88% 92%  Weight:      Height:        Intake/Output Summary (Last 24 hours) at 04/02/2020 1613 Last data filed at 04/02/2020 1100 Gross per 24 hour  Intake 1346.46 ml  Output 426 ml  Net 920.46 ml   Filed Weights   03/31/20 0137  Weight: 84.8 kg    Physical Exam:  General exam: awake, alert, no acute distress Respiratory system: CTAB diminished bases, no wheezes, improved/normal respiratory effort today without conversational dyspnea, on 13 L/min HFNC oxygen Cardiovascular system: normal S1/S2, RRR, no pedal edema.   Gastrointestinal system: soft, non-tender Central nervous system: no gross focal motor deficits, normal speech Extremities: moves all, no cyanosis, normal tone  Labs   Data Reviewed: I have personally reviewed following labs and imaging studies  CBC: Recent Labs  Lab 03/31/20 0104 03/31/20 0332 04/01/20 0556 04/02/20 0633  WBC 9.7 8.5 9.5 8.7  HGB 13.8 13.2 13.8 13.7  HCT 48.4 45.8 48.0 49.0  MCV 87.1 87.4 88.1 88.8  PLT 174 156 170 196   Basic Metabolic Panel: Recent Labs  Lab 03/31/20 0104 03/31/20 0332 04/01/20 0556 04/02/20 0633  NA 139 140 139 143  K 3.8 3.8 4.5 4.3  CL 96* 94* 95* 93*  CO2 36* 38* 37* 39*  GLUCOSE 119* 105*  114* 68*  BUN 39* 40* 41* 34*  CREATININE 0.74 0.78 0.73 0.68  CALCIUM 9.1 9.1 9.3 9.5  MG  --   --  2.2  --    GFR: Estimated Creatinine Clearance: 77.9 mL/min (by C-G formula based on SCr of 0.68 mg/dL). Liver Function Tests: No results for input(s): AST, ALT, ALKPHOS, BILITOT, PROT, ALBUMIN in the last 168 hours. No results for input(s): LIPASE, AMYLASE in the last 168 hours. No results for input(s): AMMONIA in the last 168 hours. Coagulation Profile: Recent Labs  Lab 03/31/20 0332  INR 1.4*   Cardiac Enzymes: No results for input(s): CKTOTAL, CKMB,  CKMBINDEX, TROPONINI in the last 168 hours. BNP (last 3 results) No results for input(s): PROBNP in the last 8760 hours. HbA1C: No results for input(s): HGBA1C in the last 72 hours. CBG: No results for input(s): GLUCAP in the last 168 hours. Lipid Profile: Recent Labs    03/31/20 0332  CHOL 148  HDL 30*  LDLCALC 101*  TRIG 86  CHOLHDL 4.9   Thyroid Function Tests: No results for input(s): TSH, T4TOTAL, FREET4, T3FREE, THYROIDAB in the last 72 hours. Anemia Panel: Recent Labs    04/02/20 0633  VITAMINB12 231   Sepsis Labs: Recent Labs  Lab 03/31/20 0101 03/31/20 0154  PROCALCITON <0.10  --   LATICACIDVEN  --  1.0    Recent Results (from the past 240 hour(s))  Resp Panel by RT-PCR (Flu A&B, Covid) Nasopharyngeal Swab     Status: None   Collection Time: 03/31/20  1:28 AM   Specimen: Nasopharyngeal Swab; Nasopharyngeal(NP) swabs in vial transport medium  Result Value Ref Range Status   SARS Coronavirus 2 by RT PCR NEGATIVE NEGATIVE Final    Comment: (NOTE) SARS-CoV-2 target nucleic acids are NOT DETECTED.  The SARS-CoV-2 RNA is generally detectable in upper respiratory specimens during the acute phase of infection. The lowest concentration of SARS-CoV-2 viral copies this assay can detect is 138 copies/mL. A negative result does not preclude SARS-Cov-2 infection and should not be used as the sole basis for  treatment or other patient management decisions. A negative result may occur with  improper specimen collection/handling, submission of specimen other than nasopharyngeal swab, presence of viral mutation(s) within the areas targeted by this assay, and inadequate number of viral copies(<138 copies/mL). A negative result must be combined with clinical observations, patient history, and epidemiological information. The expected result is Negative.  Fact Sheet for Patients:  BloggerCourse.com  Fact Sheet for Healthcare Providers:  SeriousBroker.it  This test is no t yet approved or cleared by the [place] FDA and  has been authorized for detection and/or diagnosis of SARS-CoV-2 by FDA under an Emergency Use Authorization (EUA). This EUA will remain  in effect (meaning this test can be used) for the duration of the COVID-19 declaration under Section 564(b)(1) of the Act, 21 U.S.C.section 360bbb-3(b)(1), unless the authorization is terminated  or revoked sooner.       Influenza A by PCR NEGATIVE NEGATIVE Final   Influenza B by PCR NEGATIVE NEGATIVE Final    Comment: (NOTE) The Xpert Xpress SARS-CoV-2/FLU/RSV plus assay is intended as an aid in the diagnosis of influenza from Nasopharyngeal swab specimens and should not be used as a sole basis for treatment. Nasal washings and aspirates are unacceptable for Xpert Xpress SARS-CoV-2/FLU/RSV testing.  Fact Sheet for Patients: BloggerCourse.com  Fact Sheet for Healthcare Providers: SeriousBroker.it  This test is not yet approved or cleared by the [place] FDA and has been authorized for detection and/or diagnosis of SARS-CoV-2 by FDA under an Emergency Use Authorization (EUA). This EUA will remain in effect (meaning this test can be used) for the duration of the COVID-19 declaration under Section 564(b)(1) of the Act, 21  U.S.C. section 360bbb-3(b)(1), unless the authorization is terminated or revoked.  Performed at [facility], [address] [place], [place] [address]   Culture, blood (routine x 2)     Status: None (Preliminary result)   Collection Time: 03/31/20  1:54 AM   Specimen: BLOOD  Result Value Ref Range Status   Specimen Description BLOOD RIGHT ANTECUBITAL  Final  Special Requests   Final    BOTTLES DRAWN AEROBIC AND ANAEROBIC Blood Culture adequate volume   Culture   Final    NO GROWTH 2 DAYS Performed at [facility], [address] [place], [place] [address]    Report Status PENDING  Incomplete  Culture, blood (routine x 2)     Status: None (Preliminary result)   Collection Time: 03/31/20  1:55 AM   Specimen: BLOOD  Result Value Ref Range Status   Specimen Description BLOOD RIGHT ANTECUBITAL  Final   Special Requests   Final    BOTTLES DRAWN AEROBIC AND ANAEROBIC Blood Culture adequate volume   Culture   Final    NO GROWTH 2 DAYS Performed at [facility], [address] [place], [place] [address]    Report Status PENDING  Incomplete  MRSA PCR Screening     Status: None   Collection Time: 04/01/20  4:00 AM   Specimen: Nasopharyngeal  Result Value Ref Range Status   MRSA by PCR NEGATIVE NEGATIVE Final    Comment:        The GeneXpert MRSA Assay (FDA approved for NASAL specimens only), is one component of a comprehensive MRSA colonization surveillance program. It is not intended to diagnose MRSA infection nor to guide or monitor treatment for MRSA infections. Performed at [facility], [address] [place], [place] [address]       Imaging Studies   ECHOCARDIOGRAM COMPLETE  Result Date: 04/01/2020    ECHOCARDIOGRAM REPORT   Patient Name:   Waldo LaineHILIP Cowie Date of Exam: 04/01/2020 Medical Rec #:  [id number]        Height:       67.0 in Accession #:    [id number]       Weight:       187.0 lb Date of Birth:  03/02/1940          BSA:          1.965 m Patient Age:    79 years         BP:           125/61 mmHg Patient Gender: M                HR:           58 bpm. Exam Location:  [facility] Procedure: 2D Echo, Cardiac Doppler and Color Doppler Indications:     NSTEMI I21.4  History:         Patient has no prior history of Echocardiogram examinations. No                  medical history on file.  Sonographer:     Cristela BlueJerry Hege RDCS (AE) Referring Phys:  [id number] Vernetta HoneyJAN A MANSY Diagnosing Phys: Lorine BearsMuhammad Arida MD  Sonographer Comments: Suboptimal apical window. IMPRESSIONS  1. Left ventricular ejection fraction, by estimation, is 60 to 65%. The left ventricle has normal function. The left ventricle has no regional wall motion abnormalities. There is moderate left ventricular hypertrophy. Left ventricular diastolic parameters are consistent with Grade II diastolic dysfunction (pseudonormalization).  2. Right ventricular systolic function is normal. The right ventricular size is normal. Tricuspid regurgitation signal is inadequate for assessing PA pressure.  3. Left atrial size was mildly dilated.  4. The mitral valve is normal in structure. No evidence of mitral valve regurgitation. No evidence of mitral stenosis. Moderate mitral annular calcification.  5. The aortic valve is normal in structure. Aortic valve regurgitation is  not visualized. Mild aortic valve stenosis.  6. Aortic dilatation noted. There is mild dilatation of the ascending aorta, measuring 41 mm.  7. The inferior vena cava is dilated in size with <50% respiratory variability, suggesting right atrial pressure of 15 mmHg. FINDINGS  Left Ventricle: Left ventricular ejection fraction, by estimation, is 60 to 65%. The left ventricle has normal function. The left ventricle has no regional wall motion abnormalities. The left ventricular internal cavity size was normal in size. There is  moderate left ventricular hypertrophy. Left ventricular diastolic parameters are consistent with Grade II  diastolic dysfunction (pseudonormalization). Right Ventricle: The right ventricular size is normal. No increase in right ventricular wall thickness. Right ventricular systolic function is normal. Tricuspid regurgitation signal is inadequate for assessing PA pressure. Left Atrium: Left atrial size was mildly dilated. Right Atrium: Right atrial size was normal in size. Pericardium: There is no evidence of pericardial effusion. Mitral Valve: The mitral valve is normal in structure. Moderate mitral annular calcification. No evidence of mitral valve regurgitation. No evidence of mitral valve stenosis. Tricuspid Valve: The tricuspid valve is normal in structure. Tricuspid valve regurgitation is not demonstrated. No evidence of tricuspid stenosis. Aortic Valve: The aortic valve is normal in structure. Aortic valve regurgitation is not visualized. Mild aortic stenosis is present. Aortic valve mean gradient measures 4.5 mmHg. Aortic valve peak gradient measures 7.7 mmHg. Aortic valve area, by VTI measures 2.13 cm. Pulmonic Valve: The pulmonic valve was normal in structure. Pulmonic valve regurgitation is not visualized. No evidence of pulmonic stenosis. Aorta: The aortic root is normal in size and structure and aortic dilatation noted. There is mild dilatation of the ascending aorta, measuring 41 mm. Venous: The inferior vena cava is dilated in size with less than 50% respiratory variability, suggesting right atrial pressure of 15 mmHg. IAS/Shunts: No atrial level shunt detected by color flow Doppler.  LEFT VENTRICLE PLAX 2D LVIDd:         4.14 cm  Diastology LVIDs:         2.69 cm  LV e' medial:    4.24 cm/s LV PW:         1.14 cm  LV E/e' medial:  22.6 LV IVS:        1.16 cm  LV e' lateral:   4.57 cm/s LVOT diam:     2.20 cm  LV E/e' lateral: 21.0 LV SV:         73 LV SV Index:   37 LVOT Area:     3.80 cm  RIGHT VENTRICLE RV Basal diam:  3.37 cm RV S prime:     9.46 cm/s TAPSE (M-mode): 3.8 cm LEFT ATRIUM              Index       RIGHT ATRIUM           Index LA diam:        3.90 cm 1.98 cm/m  RA Area:     10.60 cm LA Vol (A2C):   78.4 ml 39.89 ml/m RA Volume:   20.70 ml  10.53 ml/m LA Vol (A4C):   70.5 ml 35.87 ml/m LA Biplane Vol: 79.9 ml 40.66 ml/m  AORTIC VALVE                    PULMONIC VALVE AV Area (Vmax):    2.13 cm     PV Vmax:        0.69 m/s AV Area (Vmean):   2.18  cm     PV Peak grad:   1.9 mmHg AV Area (VTI):     2.13 cm     RVOT Peak grad: 2 mmHg AV Vmax:           138.50 cm/s AV Vmean:          101.500 cm/s AV VTI:            0.342 m AV Peak Grad:      7.7 mmHg AV Mean Grad:      4.5 mmHg LVOT Vmax:         77.70 cm/s LVOT Vmean:        58.300 cm/s LVOT VTI:          0.192 m LVOT/AV VTI ratio: 0.56  AORTA Ao Root diam: 4.00 cm MITRAL VALVE                TRICUSPID VALVE MV Area (PHT): 3.95 cm     TR Peak grad:   8.4 mmHg MV Decel Time: 192 msec     TR Vmax:        145.00 cm/s MV E velocity: 96.00 cm/s MV A velocity: 113.00 cm/s  SHUNTS MV E/A ratio:  0.85         Systemic VTI:  0.19 m                             Systemic Diam: 2.20 cm Lorine Bears MD Electronically signed by Lorine Bears MD Signature Date/Time: 04/01/2020/10:36:14 AM    Final      Medications   Scheduled Meds: . allopurinol  300 mg Oral Daily  . [START ON 04/03/2020] aspirin EC  81 mg Oral Daily  . atorvastatin  80 mg Oral Daily  . budesonide (PULMICORT) nebulizer solution  0.25 mg Nebulization BID  . erythromycin  1 application Right Eye TID  . escitalopram  20 mg Oral Daily  . folic acid  1 mg Oral Daily  . guaiFENesin  600 mg Oral BID  . hydrochlorothiazide  25 mg Oral Daily  . ipratropium-albuterol  3 mL Nebulization Q6H WA  . losartan  100 mg Oral Daily  . magnesium oxide  400 mg Oral BID  . mouth rinse  15 mL Mouth Rinse BID  . metoprolol succinate  75 mg Oral Daily  . multivitamin with minerals  1 tablet Oral Daily  . polyvinyl alcohol  1 drop Right Eye Q6H  . prednisoLONE acetate  1 drop Right Eye BID  .  thiamine  250 mg Oral Daily   Continuous Infusions: . sodium chloride 250 mL (04/01/20 0520)  . azithromycin 500 mg (04/02/20 0448)  . cefTRIAXone (ROCEPHIN)  IV 2 g (04/02/20 0412)       LOS: 2 days    Time spent: 30 minutes with greater than 50% spent at bedside and in coordination of care    Pennie Banter, DO [facility]  04/02/2020, 4:13 PM      If 7PM-7AM, please contact night-coverage. How to contact the [facility] Attending or Consulting provider 7A - 7P or covering provider during after hours 7P -7A, for this patient?    1. Check the care team in [facility] and look for a) attending/consulting [facility] provider listed and b) the [facility] team listed 2. Log into [email or web address] and use [facility] universal password to access. If you do not have the password, please contact the hospital operator. 3. Locate the  [facility] provider you are looking for under [facility] and page to a number that you can be directly reached. 4. If you still have difficulty reaching the provider, please page the [facility] (Director on Call) for the Hospitalists listed on amion for assistance.

## 2020-04-02 NOTE — Progress Notes (Signed)
ANTICOAGULATION CONSULT NOTE   Pharmacy Consult for Heparin Infusion (apixaban PTA) Indication: ACS/STEMI  Allergies  Allergen Reactions  . Amlodipine     Patient Measurements: Height: 5\' 7"  (170.2 cm) Weight: 84.8 kg (187 lb) IBW/kg (Calculated) : 66.1 Heparin Dosing Weight: 83.3 kg  Vital Signs: Temp: 98.3 F (36.8 C) (02/15 0517) Temp Source: Oral (02/15 0517) BP: 117/72 (02/15 0517) Pulse Rate: 70 (02/15 0517)  Labs: Recent Labs    03/31/20 0332 03/31/20 0551 03/31/20 1200 03/31/20 2038 04/01/20 0556 04/02/20 0633  HGB 13.2  --   --   --  13.8 13.7  HCT 45.8  --   --   --  48.0 49.0  PLT 156  --   --   --  170 196  APTT 45*  --  62* 84* 76* 59*  LABPROT 16.8*  --   --   --   --   --   INR 1.4*  --   --   --   --   --   HEPARINUNFRC  --   --  2.56*  --  1.74*  --   CREATININE 0.78  --   --   --  0.73 0.68  TROPONINIHS  --  1,218*  --   --  891* 1,060*    Estimated Creatinine Clearance: 77.9 mL/min (by C-G formula based on SCr of 0.68 mg/dL).   Medical History: Pt on Eliquis 5 mg BID PTA.-last dose 2/12 at 2000 per Med Rec  Assessment: Pt is 80 yo male c/o SOB arriving at ED via EMS with elevated troponin lvl. Elevated troponin, (1263 -> 1218-> 891) in the absence of chest pain, presenting in respiratory distress. 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  Pt has  Hx of PE in Dec 21. On apixaban PTA due to hx of PE in 11/2019.   Goal of Therapy:  APTT 66-102 seconds.  Heparin level 0.3-0.7 units/ml once aPTT and HL correlate.  Monitor platelets by anticoagulation protocol: Yes  2/13 1200 aPTT=62, subtherapeutic  HL 2.56 2/13 2038 aPTT=84, therapeutic 2/14 0556 aPTT=76, therapeutic x 2, HL 1.74 2/15 0623 aPTT=59, subtherapeutic.    Plan:  APTT is subtherapeutic and heparin level is still pending. Will increase heparin infusion to 1450 units/hr. Recheck aPTT in 8 hours. CBC  daily while on heparin. F/u with card's plan for duration of heparin infusion.  Will switch to heparin level monitoring once heparin level and aPTT correlate.    3/15, PharmD, BCPS.  04/02/2020 8:00 AM

## 2020-04-02 NOTE — Progress Notes (Addendum)
[facility]    SUBJECTIVE: The patient denies chest pain or palpitations this morning. His main complaint is shortness of breath, which is states is better this morning.   Vitals:   04/01/20 1539 04/01/20 2020 04/01/20 2029 04/02/20 0517  BP: 135/77  138/71 117/72  Pulse: 60  62 70  Resp:   20 20  Temp: 97.6 F (36.4 C)  97.8 F (36.6 C) 98.3 F (36.8 C)  TempSrc: Oral  Oral Oral  SpO2: 95% 92% 95% 99%  Weight:      Height:         Intake/Output Summary (Last 24 hours) at 04/02/2020 0827 Last data filed at 04/02/2020 0557 Gross per 24 hour  Intake 986.46 ml  Output 300 ml  Net 686.46 ml      PHYSICAL EXAM  General: Well developed, well nourished, elderly gentleman lying in bed in no acute distress, protective mittens in place both hands HEENT:  Normocephalic and atramatic Neck:  No JVD.  Lungs: normal effort of breathing with supplemental O2 via [place], decreased breath sound right lower lobe Heart: HRRR . Normal S1 and S2 without gallops or murmurs.  Abdomen: nondistended Msk:  Back normal, gait not assessed. Extremities: No clubbing, cyanosis or edema.   Neuro: Alert, oriented to person, place Psych:  Good affect, responds appropriately   LABS: Basic Metabolic Panel: Recent Labs    04/01/20 0556 04/02/20 0633  NA 139 143  K 4.5 4.3  CL 95* 93*  CO2 37* 39*  GLUCOSE 114* 68*  BUN 41* 34*  CREATININE 0.73 0.68  CALCIUM 9.3 9.5  MG 2.2  --    Liver Function Tests: No results for input(s): AST, ALT, ALKPHOS, BILITOT, PROT, ALBUMIN in the last 72 hours. No results for input(s): LIPASE, AMYLASE in the last 72 hours. CBC: Recent Labs    04/01/20 0556 04/02/20 0633  WBC 9.5 8.7  HGB 13.8 13.7  HCT 48.0 49.0  MCV 88.1 88.8  PLT 170 196   Cardiac Enzymes: No results for input(s): CKTOTAL, CKMB, CKMBINDEX, TROPONINI in the last 72 hours. BNP: Invalid input(s): POCBNP D-Dimer: No results for input(s): DDIMER in the last 72 hours. Hemoglobin  A1C: No results for input(s): HGBA1C in the last 72 hours. Fasting Lipid Panel: Recent Labs    03/31/20 0332  CHOL 148  HDL 30*  LDLCALC 101*  TRIG 86  CHOLHDL 4.9   Thyroid Function Tests: No results for input(s): TSH, T4TOTAL, T3FREE, THYROIDAB in the last 72 hours.  Invalid input(s): FREET3 Anemia Panel: No results for input(s): VITAMINB12, FOLATE, FERRITIN, TIBC, IRON, RETICCTPCT in the last 72 hours.  ECHOCARDIOGRAM COMPLETE  Result Date: 04/01/2020    ECHOCARDIOGRAM REPORT   Patient Name:   Melvin Campbell Date of Exam: 04/01/2020 Medical Rec #:  [id number]        Height:       67.0 in Accession #:    [id number]       Weight:       187.0 lb Date of Birth:  December 07, 1940         BSA:          1.965 m Patient Age:    80 years         BP:           125/61 mmHg Patient Gender: M                HR:           58  bpm. Exam Location:  [facility] Procedure: 2D Echo, Cardiac Doppler and Color Doppler Indications:     NSTEMI I21.4  History:         Patient has no prior history of Echocardiogram examinations. No                  medical history on file.  Sonographer:     Cristela Blue RDCS (AE) Referring Phys:  [id number] Vernetta Honey MANSY Diagnosing Phys: Lorine Bears MD  Sonographer Comments: Suboptimal apical window. IMPRESSIONS  1. Left ventricular ejection fraction, by estimation, is 60 to 65%. The left ventricle has normal function. The left ventricle has no regional wall motion abnormalities. There is moderate left ventricular hypertrophy. Left ventricular diastolic parameters are consistent with Grade II diastolic dysfunction (pseudonormalization).  2. Right ventricular systolic function is normal. The right ventricular size is normal. Tricuspid regurgitation signal is inadequate for assessing PA pressure.  3. Left atrial size was mildly dilated.  4. The mitral valve is normal in structure. No evidence of mitral valve regurgitation. No evidence of mitral stenosis. Moderate mitral annular calcification.  5. The  aortic valve is normal in structure. Aortic valve regurgitation is not visualized. Mild aortic valve stenosis.  6. Aortic dilatation noted. There is mild dilatation of the ascending aorta, measuring 41 mm.  7. The inferior vena cava is dilated in size with <50% respiratory variability, suggesting right atrial pressure of 15 mmHg. FINDINGS  Left Ventricle: Left ventricular ejection fraction, by estimation, is 60 to 65%. The left ventricle has normal function. The left ventricle has no regional wall motion abnormalities. The left ventricular internal cavity size was normal in size. There is  moderate left ventricular hypertrophy. Left ventricular diastolic parameters are consistent with Grade II diastolic dysfunction (pseudonormalization). Right Ventricle: The right ventricular size is normal. No increase in right ventricular wall thickness. Right ventricular systolic function is normal. Tricuspid regurgitation signal is inadequate for assessing PA pressure. Left Atrium: Left atrial size was mildly dilated. Right Atrium: Right atrial size was normal in size. Pericardium: There is no evidence of pericardial effusion. Mitral Valve: The mitral valve is normal in structure. Moderate mitral annular calcification. No evidence of mitral valve regurgitation. No evidence of mitral valve stenosis. Tricuspid Valve: The tricuspid valve is normal in structure. Tricuspid valve regurgitation is not demonstrated. No evidence of tricuspid stenosis. Aortic Valve: The aortic valve is normal in structure. Aortic valve regurgitation is not visualized. Mild aortic stenosis is present. Aortic valve mean gradient measures 4.5 mmHg. Aortic valve peak gradient measures 7.7 mmHg. Aortic valve area, by VTI measures 2.13 cm. Pulmonic Valve: The pulmonic valve was normal in structure. Pulmonic valve regurgitation is not visualized. No evidence of pulmonic stenosis. Aorta: The aortic root is normal in size and structure and aortic dilatation  noted. There is mild dilatation of the ascending aorta, measuring 41 mm. Venous: The inferior vena cava is dilated in size with less than 50% respiratory variability, suggesting right atrial pressure of 15 mmHg. IAS/Shunts: No atrial level shunt detected by color flow Doppler.  LEFT VENTRICLE PLAX 2D LVIDd:         4.14 cm  Diastology LVIDs:         2.69 cm  LV e' medial:    4.24 cm/s LV PW:         1.14 cm  LV E/e' medial:  22.6 LV IVS:        1.16 cm  LV e' lateral:   4.57 cm/s LVOT  diam:     2.20 cm  LV E/e' lateral: 21.0 LV SV:         73 LV SV Index:   37 LVOT Area:     3.80 cm  RIGHT VENTRICLE RV Basal diam:  3.37 cm RV S prime:     9.46 cm/s TAPSE (M-mode): 3.8 cm LEFT ATRIUM             Index       RIGHT ATRIUM           Index LA diam:        3.90 cm 1.98 cm/m  RA Area:     10.60 cm LA Vol (A2C):   78.4 ml 39.89 ml/m RA Volume:   20.70 ml  10.53 ml/m LA Vol (A4C):   70.5 ml 35.87 ml/m LA Biplane Vol: 79.9 ml 40.66 ml/m  AORTIC VALVE                    PULMONIC VALVE AV Area (Vmax):    2.13 cm     PV Vmax:        0.69 m/s AV Area (Vmean):   2.18 cm     PV Peak grad:   1.9 mmHg AV Area (VTI):     2.13 cm     RVOT Peak grad: 2 mmHg AV Vmax:           138.50 cm/s AV Vmean:          101.500 cm/s AV VTI:            0.342 m AV Peak Grad:      7.7 mmHg AV Mean Grad:      4.5 mmHg LVOT Vmax:         77.70 cm/s LVOT Vmean:        58.300 cm/s LVOT VTI:          0.192 m LVOT/AV VTI ratio: 0.56  AORTA Ao Root diam: 4.00 cm MITRAL VALVE                TRICUSPID VALVE MV Area (PHT): 3.95 cm     TR Peak grad:   8.4 mmHg MV Decel Time: 192 msec     TR Vmax:        145.00 cm/s MV E velocity: 96.00 cm/s MV A velocity: 113.00 cm/s  SHUNTS MV E/A ratio:  0.85         Systemic VTI:  0.19 m                             Systemic Diam: 2.20 cm Lorine BearsMuhammad Arida MD Electronically signed by Lorine BearsMuhammad Arida MD Signature Date/Time: 04/01/2020/10:36:14 AM    Final      Echo LVEF 60-65%, no RWMA, moderate LVH, mild  AS  TELEMETRY: sinus rhythm  ASSESSMENT AND PLAN:  Principal Problem:   Acute on chronic respiratory failure with hypoxia (HCC) Active Problems:   Elevated troponin   Community acquired pneumonia   Bilateral lower extremity edema   1. Myocardial injury, with elevated high sensitivity troponin (1263-> 1218-> 891->1060) in the absence of chest pain, presenting in respiratory distress. 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 2D echocardiogram reveals preserved LV function with no wall motion abnormalities.  2. Acute hypoxemia with acute on chronic hypoxic respiratory failure; chest CTA negative for PE, with  right middle lobe atelectasis versus pneumonia. Patient on chronic O2 at home. 3. Hypertension 4. 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 Continue heparin drip for a total of 48 hours, then discontinue. 2. Defer cardiac catheterization in the absence of chest pain or wall motion abnormalities per echo 3. Continue aspirin, statin, HCTZ, losartan, and metoprolol succinate     Leanora Ivanoff, PA-C 04/02/2020 8:27 AM  Discussed with Dr. Darrold Junker who agrees with the above plan.

## 2020-04-02 NOTE — Plan of Care (Signed)

## 2020-04-03 ENCOUNTER — Inpatient Hospital Stay: Payer: Medicare Other

## 2020-04-03 DIAGNOSIS — J9621 Acute and chronic respiratory failure with hypoxia: Secondary | ICD-10-CM | POA: Diagnosis not present

## 2020-04-03 LAB — CBC
HCT: 48.7 % (ref 39.0–52.0)
Hemoglobin: 13.8 g/dL (ref 13.0–17.0)
MCH: 25 pg — ABNORMAL LOW (ref 26.0–34.0)
MCHC: 28.3 g/dL — ABNORMAL LOW (ref 30.0–36.0)
MCV: 88.2 fL (ref 80.0–100.0)
Platelets: 159 10*3/uL (ref 150–400)
RBC: 5.52 MIL/uL (ref 4.22–5.81)
RDW: 14.5 % (ref 11.5–15.5)
WBC: 8.1 10*3/uL (ref 4.0–10.5)
nRBC: 0 % (ref 0.0–0.2)

## 2020-04-03 LAB — BLOOD GAS, ARTERIAL
Acid-Base Excess: 20.4 mmol/L — ABNORMAL HIGH (ref 0.0–2.0)
Bicarbonate: 50.2 mmol/L — ABNORMAL HIGH (ref 20.0–28.0)
O2 Saturation: 87.3 %
Patient temperature: 37
pCO2 arterial: 83 mmHg (ref 32.0–48.0)
pH, Arterial: 7.39 (ref 7.350–7.450)
pO2, Arterial: 54 mmHg — ABNORMAL LOW (ref 83.0–108.0)

## 2020-04-03 LAB — BASIC METABOLIC PANEL
Anion gap: 10 (ref 5–15)
BUN: 21 mg/dL (ref 8–23)
CO2: 36 mmol/L — ABNORMAL HIGH (ref 22–32)
Calcium: 9.6 mg/dL (ref 8.9–10.3)
Chloride: 96 mmol/L — ABNORMAL LOW (ref 98–111)
Creatinine, Ser: 0.59 mg/dL — ABNORMAL LOW (ref 0.61–1.24)
GFR, Estimated: >60 mL/min (ref >60)
Glucose, Bld: 65 mg/dL — ABNORMAL LOW (ref 70–99)
Potassium: 4.3 mmol/L (ref 3.5–5.1)
Sodium: 142 mmol/L (ref 135–145)

## 2020-04-03 LAB — TROPONIN I (HIGH SENSITIVITY): Troponin I (High Sensitivity): 440 ng/L (ref <18)

## 2020-04-03 MED ORDER — ALBUTEROL SULFATE (2.5 MG/3ML) 0.083% IN NEBU: 2.5000 mg | INHALATION_SOLUTION | RESPIRATORY_TRACT | Status: DC | PRN

## 2020-04-03 MED ORDER — ACETYLCYSTEINE 20 % IN SOLN
2.0000 mL | Freq: Two times a day (BID) | RESPIRATORY_TRACT | Status: DC
  Administered 2020-04-03 – 2020-04-10 (×15): 2 mL via RESPIRATORY_TRACT
  Administered 2020-04-11: 4 mL via RESPIRATORY_TRACT
  Filled 2020-04-03 (×18): qty 4

## 2020-04-03 MED ORDER — IPRATROPIUM-ALBUTEROL 0.5-2.5 (3) MG/3ML IN SOLN
3.0000 mL | Freq: Four times a day (QID) | RESPIRATORY_TRACT | Status: DC
  Administered 2020-04-03 – 2020-04-05 (×8): 3 mL via RESPIRATORY_TRACT
  Filled 2020-04-03 (×6): qty 3

## 2020-04-03 MED ORDER — PREDNISONE 20 MG PO TABS
40.0000 mg | ORAL_TABLET | Freq: Every day | ORAL | Status: AC
  Administered 2020-04-06 – 2020-04-09 (×4): 40 mg via ORAL
  Filled 2020-04-03 (×4): qty 2

## 2020-04-03 MED ORDER — FUROSEMIDE 10 MG/ML IJ SOLN
40.0000 mg | Freq: Once | INTRAMUSCULAR | Status: AC
  Administered 2020-04-03: 40 mg via INTRAVENOUS
  Filled 2020-04-03: qty 4

## 2020-04-03 MED ORDER — METHYLPREDNISOLONE SODIUM SUCC 40 MG IJ SOLR
40.0000 mg | Freq: Four times a day (QID) | INTRAMUSCULAR | Status: AC
  Administered 2020-04-03 – 2020-04-05 (×8): 40 mg via INTRAVENOUS
  Filled 2020-04-03 (×8): qty 1

## 2020-04-03 MED ORDER — PANTOPRAZOLE SODIUM 40 MG PO TBEC
40.0000 mg | DELAYED_RELEASE_TABLET | Freq: Every day | ORAL | Status: AC
  Administered 2020-04-03 – 2020-04-07 (×5): 40 mg via ORAL
  Filled 2020-04-03 (×5): qty 1

## 2020-04-03 NOTE — Consult Note (Signed)
Pulmonary Medicine          Date: 04/03/2020,   MRN# [id number] Melvin Campbell [birth year]     AdmissionWeight: 84.8 kg                 CurrentWeight: 84.8 kg   Referring physician: Dr Maryfrances Bunnell   CHIEF COMPLAINT:   Chronic hypercapnic and hypoxemic respiratory failure   HISTORY OF PRESENT ILLNESS   Melvin Campbell is a 80 y.o. male with medical history significant for hypertension, dyslipidemia and PE with chronic respiratory failure on home O2 at 2.5 L/min by nasal cannula, and dementia who presented to the emergency room with acute onset of worsening dyspnea at his skilled nursing facility with associated hypoxemia with a pulse oximetry of 87% on his O2 at 2.5 L and wheezing.  Pulse oximetry improved to 93% on 4 L of O2.  He denied chest pain nausea vomiting or diaphoresis or palpitations.  He admited to chronic lower extremity edema.  No fever or chills.  No bleeding diathesis.  No dysuria, oliguria or hematuria or flank pain.   Labs revealed a BUN of 39 creatinine 0.74, BNP of 23.2 and high-sensitivity troponin of 1263 with lactic acid of 1 and CBC levels unremarkable.  Influenza antigens and COVID-19 PCR came back negative.  Blood cultures were drawn.  INR is 1.4 and PT 16.8 with PTT 45. Chest CTA revealed no PE.  Showed cardiomegaly and dilated main pulmonary artery consistent with pulmonary artery hypertension by basal independent right middle lobe consolidation with volume loss likely due to atelectasis and differential diagnosis would include pneumonia.  Aortic atherosclerosis tortuosity and moderate emphysema. Patient has been on BIPAP and has had recurrent hypercanpnia    PAST MEDICAL HISTORY   History reviewed. No pertinent past medical history.   SURGICAL HISTORY   No available surgical data available at this time  FAMILY HISTORY   No family history on file.   SOCIAL HISTORY   Social History   Tobacco Use  . Smoking status: Never Smoker  .  Smokeless tobacco: Never Used     MEDICATIONS    Home Medication:    Current Medication:  Current Facility-Administered Medications:  .  0.9 %  sodium chloride infusion, , Intravenous, PRN, Mansy, Jan A, MD, Last Rate: 4 mL/hr at 04/01/20 0520, 250 mL at 04/01/20 0520 .  acetaminophen (TYLENOL) tablet 650 mg, 650 mg, Oral, Q4H PRN, Mansy, Jan A, MD, 650 mg at 03/31/20 0742 .  acetylcysteine (MUCOMYST) 10 % nebulizer solution 2 mL, 2 mL, Nebulization, BID, Danford, Christopher P, MD .  albuterol (PROVENTIL) (2.5 MG/3ML) 0.083% nebulizer solution 2.5 mg, 2.5 mg, Nebulization, Q2H PRN, Danford, Earl Lites, MD .  allopurinol (ZYLOPRIM) tablet 300 mg, 300 mg, Oral, Daily, Mansy, Jan A, MD, 300 mg at 04/03/20 1003 .  ALPRAZolam Prudy Feeler) tablet 0.25 mg, 0.25 mg, Oral, BID PRN, Mansy, Jan A, MD, 0.25 mg at 04/02/20 1948 .  apixaban (ELIQUIS) tablet 5 mg, 5 mg, Oral, BID, Ronnald Ramp, RPH, 5 mg at 04/03/20 1001 .  aspirin EC tablet 81 mg, 81 mg, Oral, Daily, Leanora Ivanoff, PA-C, 81 mg at 04/03/20 1002 .  atorvastatin (LIPITOR) tablet 80 mg, 80 mg, Oral, Daily, Mansy, Jan A, MD, 80 mg at 04/03/20 1001 .  azithromycin (ZITHROMAX) 500 mg in sodium chloride 0.9 % 250 mL IVPB, 500 mg, Intravenous, Q24H, Mansy, Jan A, MD, Last Rate: 250 mL/hr at 04/03/20 0627, 500 mg at 04/03/20 8119 .  budesonide (PULMICORT) nebulizer solution 0.25 mg, 0.25 mg, Nebulization, BID, Esaw GrandchildGriffith, Kelly A, DO, 0.25 mg at 04/03/20 [id number] .  cefTRIAXone (ROCEPHIN) 2 g in sodium chloride 0.9 % 100 mL IVPB, 2 g, Intravenous, Q24H, Mansy, Jan A, MD, Last Rate: 200 mL/hr at 04/03/20 0516, 2 g at 04/03/20 0516 .  erythromycin ophthalmic ointment 1 application, 1 application, Right Eye, TID, Mansy, Vernetta HoneyJan A, MD, 1 application at 04/03/20 1005 .  escitalopram (LEXAPRO) tablet 20 mg, 20 mg, Oral, Daily, Mansy, Jan A, MD, 20 mg at 04/03/20 1003 .  folic acid (FOLVITE) tablet 1 mg, 1 mg, Oral, Daily, Mansy, Jan A, MD, 1 mg at 04/03/20  1001 .  furosemide (LASIX) tablet 20 mg, 20 mg, Oral, Daily PRN, Mansy, Jan A, MD, 20 mg at 03/31/20 0742 .  guaiFENesin (MUCINEX) 12 hr tablet 600 mg, 600 mg, Oral, BID, Mansy, Jan A, MD, 600 mg at 04/03/20 1001 .  hydrochlorothiazide (HYDRODIURIL) tablet 25 mg, 25 mg, Oral, Daily, Mansy, Jan A, MD, 25 mg at 04/03/20 1001 .  ipratropium-albuterol (DUONEB) 0.5-2.5 (3) MG/3ML nebulizer solution 3 mL, 3 mL, Nebulization, Q6H, Danford, Earl Liteshristopher P, MD, 3 mL at 04/03/20 1327 .  losartan (COZAAR) tablet 100 mg, 100 mg, Oral, Daily, Mansy, Jan A, MD, 100 mg at 04/03/20 1001 .  magnesium oxide (MAG-OX) tablet 400 mg, 400 mg, Oral, BID, Mansy, Jan A, MD, 400 mg at 04/03/20 1000 .  MEDLINE mouth rinse, 15 mL, Mouth Rinse, BID, Mansy, Jan A, MD, 15 mL at 04/01/20 2105 .  methylPREDNISolone sodium succinate (SOLU-MEDROL) 40 mg/mL injection 40 mg, 40 mg, Intravenous, Q6H, 40 mg at 04/03/20 1255 **FOLLOWED BY** [START ON 04/06/2020] predniSONE (DELTASONE) tablet 40 mg, 40 mg, Oral, Q breakfast, Danford, Earl Liteshristopher P, MD .  metoprolol succinate (TOPROL-XL) 24 hr tablet 75 mg, 75 mg, Oral, Daily, Mansy, Jan A, MD, 75 mg at 04/03/20 1000 .  multivitamin with minerals tablet 1 tablet, 1 tablet, Oral, Daily, Mansy, Jan A, MD, 1 tablet at 04/03/20 1001 .  nitroGLYCERIN (NITROSTAT) SL tablet 0.4 mg, 0.4 mg, Sublingual, Q5 Min x 3 PRN, Mansy, Jan A, MD .  ondansetron [facility](ZOFRAN) injection 4 mg, 4 mg, Intravenous, Q6H PRN, Mansy, Jan A, MD .  pantoprazole (PROTONIX) EC tablet 40 mg, 40 mg, Oral, Daily, Danford, Earl Liteshristopher P, MD, 40 mg at 04/03/20 1255 .  polyvinyl alcohol (LIQUIFILM TEARS) 1.4 % ophthalmic solution 1 drop, 1 drop, Right Eye, Q6H, Mansy, Jan A, MD, 1 drop at 04/03/20 1006 .  prednisoLONE acetate (PRED FORTE) 1 % ophthalmic suspension 1 drop, 1 drop, Right Eye, BID, Mansy, Jan A, MD, 1 drop at 04/03/20 1004 .  thiamine tablet 250 mg, 250 mg, Oral, Daily, Esaw GrandchildGriffith, Kelly A, DO, 250 mg at 04/03/20 1004 .   zolpidem (AMBIEN) tablet 5 mg, 5 mg, Oral, QHS PRN, Mansy, Jan A, MD, 5 mg at 04/02/20 2142    ALLERGIES   Amlodipine     REVIEW OF SYSTEMS    Review of Systems:  Gen:  Denies  fever, sweats, chills weigh loss  HEENT: Denies blurred vision, double vision, ear pain, eye pain, hearing loss, nose bleeds, sore throat Cardiac:  No dizziness, chest pain or heaviness, chest tightness,edema Resp:   Denies cough or sputum porduction, shortness of breath,wheezing, hemoptysis,  Gi: Denies swallowing difficulty, stomach pain, nausea or vomiting, diarrhea, constipation, bowel incontinence Gu:  Denies bladder incontinence, burning urine Ext:   Denies Joint pain, stiffness or swelling Skin: Denies  skin rash,  easy bruising or bleeding or hives Endoc:  Denies polyuria, polydipsia , polyphagia or weight change Psych:   Denies depression, insomnia or hallucinations   Other:  All other systems negative   VS: BP 137/80 (BP Location: Left Arm)   Pulse 79   Temp 97.8 F (36.6 C) (Oral)   Resp 18   Ht  (1.702 m)   Wt 84.8 kg   SpO2 90%   BMI 29.29 kg/m      PHYSICAL EXAM    GENERAL:NAD, no fevers, chills, no weakness no fatigue HEAD: Normocephalic, atraumatic.  EYES: Pupils equal, round, reactive to light. Extraocular muscles intact. No scleral icterus.  MOUTH: Moist mucosal membrane. Dentition intact. No abscess noted.  EAR, NOSE, THROAT: Clear without exudates. No external lesions.  NECK: Supple. No thyromegaly. No nodules. No JVD.  PULMONARY: crackles at bases bilaterally  CARDIOVASCULAR: S1 and S2. Regular rate and rhythm. No murmurs, rubs, or gallops. No edema. Pedal pulses 2+ bilaterally.  GASTROINTESTINAL: Soft, nontender, nondistended. No masses. Positive bowel sounds. No hepatosplenomegaly.  MUSCULOSKELETAL: No swelling, clubbing, or edema. Range of motion full in all extremities.  NEUROLOGIC: Cranial nerves II through XII are intact. No gross focal neurological  deficits. Sensation intact. Reflexes intact.  SKIN: No ulceration, lesions, rashes, or cyanosis. Skin warm and dry. Turgor intact.  PSYCHIATRIC: Mood, affect within normal limits. The patient is awake, alert and oriented x 3. Insight, judgment intact.       IMAGING    DG Chest 2 View  Result Date: 03/31/2020 CLINICAL DATA:  Shortness of breath. EXAM: CHEST - 2 VIEW COMPARISON:  None available. FINDINGS: Low lung volumes limit assessment. Heart size upper normal. Aortic atherosclerosis and tortuosity. There are streaky bibasilar opacities. Mild diffuse peribronchial thickening. No significant pleural effusion. No pneumothorax. Degenerative change throughout the spine. Right shoulder arthroplasty. No acute osseous abnormalities are seen. IMPRESSION: 1. Low lung volumes with streaky bibasilar opacities, favoring atelectasis. Mild diffuse peribronchial thickening. 2. Diffuse aortic atherosclerosis. Aortic tortuosity. Aortic Atherosclerosis (ICD10-I70.0). Electronically Signed   By: Narda Rutherford M.D.   On: 03/31/2020 01:42   CT Angio Chest PE W/Cm &/Or Wo Cm  Result Date: 03/31/2020 CLINICAL DATA:  PE suspected, high prob PE in 01/2020; hypoxic EXAM: CT ANGIOGRAPHY CHEST WITH CONTRAST TECHNIQUE: Multidetector CT imaging of the chest was performed using the standard protocol during bolus administration of intravenous contrast. Multiplanar CT image reconstructions and MIPs were obtained to evaluate the vascular anatomy. CONTRAST:  OMNIPAQUE IOHEXOL 350 MG/ML SOLN COMPARISON:  Chest radiograph earlier today.  No prior CT available. FINDINGS: Cardiovascular: No evidence of acute or chronic pulmonary embolus. There are no intraluminal pulmonary arterial filling defects. No intraluminal webs. Dilated main pulmonary artery at 4 cm. Multi chamber cardiomegaly. Coronary artery calcifications. Aortic atherosclerosis and tortuosity. Cannot assess for dissection given phase of contrast tailored to pulmonary  artery evaluation. No pericardial effusion. Mediastinum/Nodes: Prominent right lower paratracheal node measures 10 mm. 10 mm left hilar node. No visualized thyroid nodule. Patulous esophagus. Lungs/Pleura: Bibasilar and dependent right middle lobe consolidation. Bibasilar volume loss. Moderate emphysema. No septal thickening or pulmonary edema no significant pleural effusion. Upper Abdomen: Left perinephric edema is partially included, nonspecific. Lobulated splenic contours. Musculoskeletal: Diffuse thoracic spondylosis. Subacute or remote lower lateral left rib fractures with callus formation. Right shoulder arthroplasty. Review of the MIP images confirms the above findings. IMPRESSION: 1. No pulmonary embolus. Cardiomegaly. Dilated main pulmonary artery consistent with pulmonary arterial hypertension. 2. Bibasilar and dependent right middle  lobe consolidation with volume loss. Atelectasis is favored, however sterility is indeterminate by imaging, recommend correlation for pneumonia symptoms. 3. Nonspecific left perinephric edema in the upper abdomen is partially included, may be chronic or seen with urinary tract infection. 4. Aortic atherosclerosis and tortuosity. 5. Moderate emphysema Aortic Atherosclerosis (ICD10-I70.0) and Emphysema (ICD10-J43.9). Electronically Signed   By: Narda Rutherford M.D.   On: 03/31/2020 03:43   DG Chest Port 1 View  Result Date: 04/03/2020 CLINICAL DATA:  Respiratory failure with hypoxia EXAM: PORTABLE CHEST 1 VIEW COMPARISON:  March 31, 2020 chest radiograph and chest CT FINDINGS: There is underlying parenchymal fibrotic type change. There are areas of scattered atelectatic change. There is no frank edema or consolidation. Heart is upper normal in size with pulmonary vascularity normal. No adenopathy. There is aortic atherosclerosis. 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 There is right carotid artery calcification. IMPRESSION: Underlying fibrosis and scattered  areas of atelectasis. No edema or airspace opacity. Stable cardiac silhouette. Aortic Atherosclerosis (ICD10-I70.0). There is right carotid artery calcification. Electronically Signed   By: Bretta Bang III M.D.   On: 04/03/2020 12:19   ECHOCARDIOGRAM COMPLETE  Result Date: 04/01/2020    ECHOCARDIOGRAM REPORT   Patient Name:   Melvin Campbell Date of Exam: 04/01/2020 Medical Rec #:  [id number]        Height:       67.0 in Accession #:    [id number]       Weight:       187.0 lb Date of Birth:  November 13, 1940         BSA:          1.965 m Patient Age:    79 years         BP:           125/61 mmHg Patient Gender: M                HR:           58 bpm. Exam Location:  [facility] Procedure: 2D Echo, Cardiac Doppler and Color Doppler Indications:     NSTEMI I21.4  History:         Patient has no prior history of Echocardiogram examinations. No                  medical history on file.  Sonographer:     Cristela Blue RDCS (AE) Referring Phys:  [id number] Vernetta Honey MANSY Diagnosing Phys: Lorine Bears MD  Sonographer Comments: Suboptimal apical window. IMPRESSIONS  1. Left ventricular ejection fraction, by estimation, is 60 to 65%. The left ventricle has normal function. The left ventricle has no regional wall motion abnormalities. There is moderate left ventricular hypertrophy. Left ventricular diastolic parameters are consistent with Grade II diastolic dysfunction (pseudonormalization).  2. Right ventricular systolic function is normal. The right ventricular size is normal. Tricuspid regurgitation signal is inadequate for assessing PA pressure.  3. Left atrial size was mildly dilated.  4. The mitral valve is normal in structure. No evidence of mitral valve regurgitation. No evidence of mitral stenosis. Moderate mitral annular calcification.  5. The aortic valve is normal in structure. Aortic valve regurgitation is not visualized. Mild aortic valve stenosis.  6. Aortic dilatation noted. There is mild dilatation of the ascending aorta,  measuring 41 mm.  7. The inferior vena cava is dilated in size with <50% respiratory variability, suggesting right atrial pressure of 15 mmHg. FINDINGS  Left Ventricle: Left ventricular ejection fraction, by  estimation, is 60 to 65%. The left ventricle has normal function. The left ventricle has no regional wall motion abnormalities. The left ventricular internal cavity size was normal in size. There is  moderate left ventricular hypertrophy. Left ventricular diastolic parameters are consistent with Grade II diastolic dysfunction (pseudonormalization). Right Ventricle: The right ventricular size is normal. No increase in right ventricular wall thickness. Right ventricular systolic function is normal. Tricuspid regurgitation signal is inadequate for assessing PA pressure. Left Atrium: Left atrial size was mildly dilated. Right Atrium: Right atrial size was normal in size. Pericardium: There is no evidence of pericardial effusion. Mitral Valve: The mitral valve is normal in structure. Moderate mitral annular calcification. No evidence of mitral valve regurgitation. No evidence of mitral valve stenosis. Tricuspid Valve: The tricuspid valve is normal in structure. Tricuspid valve regurgitation is not demonstrated. No evidence of tricuspid stenosis. Aortic Valve: The aortic valve is normal in structure. Aortic valve regurgitation is not visualized. Mild aortic stenosis is present. Aortic valve mean gradient measures 4.5 mmHg. Aortic valve peak gradient measures 7.7 mmHg. Aortic valve area, by VTI measures 2.13 cm. Pulmonic Valve: The pulmonic valve was normal in structure. Pulmonic valve regurgitation is not visualized. No evidence of pulmonic stenosis. Aorta: The aortic root is normal in size and structure and aortic dilatation noted. There is mild dilatation of the ascending aorta, measuring 41 mm. Venous: The inferior vena cava is dilated in size with less than 50% respiratory variability, suggesting right atrial  pressure of 15 mmHg. IAS/Shunts: No atrial level shunt detected by color flow Doppler.  LEFT VENTRICLE PLAX 2D LVIDd:         4.14 cm  Diastology LVIDs:         2.69 cm  LV e' medial:    4.24 cm/s LV PW:         1.14 cm  LV E/e' medial:  22.6 LV IVS:        1.16 cm  LV e' lateral:   4.57 cm/s LVOT diam:     2.20 cm  LV E/e' lateral: 21.0 LV SV:         73 LV SV Index:   37 LVOT Area:     3.80 cm  RIGHT VENTRICLE RV Basal diam:  3.37 cm RV S prime:     9.46 cm/s TAPSE (M-mode): 3.8 cm LEFT ATRIUM             Index       RIGHT ATRIUM           Index LA diam:        3.90 cm 1.98 cm/m  RA Area:     10.60 cm LA Vol (A2C):   78.4 ml 39.89 ml/m RA Volume:   20.70 ml  10.53 ml/m LA Vol (A4C):   70.5 ml 35.87 ml/m LA Biplane Vol: 79.9 ml 40.66 ml/m  AORTIC VALVE                    PULMONIC VALVE AV Area (Vmax):    2.13 cm     PV Vmax:        0.69 m/s AV Area (Vmean):   2.18 cm     PV Peak grad:   1.9 mmHg AV Area (VTI):     2.13 cm     RVOT Peak grad: 2 mmHg AV Vmax:           138.50 cm/s AV Vmean:  101.500 cm/s AV VTI:            0.342 m AV Peak Grad:      7.7 mmHg AV Mean Grad:      4.5 mmHg LVOT Vmax:         77.70 cm/s LVOT Vmean:        58.300 cm/s LVOT VTI:          0.192 m LVOT/AV VTI ratio: 0.56  AORTA Ao Root diam: 4.00 cm MITRAL VALVE                TRICUSPID VALVE MV Area (PHT): 3.95 cm     TR Peak grad:   8.4 mmHg MV Decel Time: 192 msec     TR Vmax:        145.00 cm/s MV E velocity: 96.00 cm/s MV A velocity: 113.00 cm/s  SHUNTS MV E/A ratio:  0.85         Systemic VTI:  0.19 m                             Systemic Diam: 2.20 cm Lorine Bears MD Electronically signed by Lorine Bears MD Signature Date/Time: 04/01/2020/10:36:14 AM    Final            ASSESSMENT/PLAN   Acute on chronic hypercapnic hypoxemic respiratory failure   - patient has borderline normal chronically hypercapnic respiratory compensated arterial blood gas    - he is not morbidly obese and does not have obesity  hypoventilation syndrome or thoracic restriction associated chronic lung disease requiring NIV   - patient does not technically need BIPAP at this time but should be evaluated again once in chronic stable state to evaluate ventilation   - would recommend to continue current carepath including empiric therapy for CAP via antibiotics and Acute COPD exacerbation with solumedrol, nebulizer therapy and antimicrobials  -will obtain spirometry with graph overnight -patient currently on 13L/min HFNC -discussed care plan with Attending physician and RN as group.   Bibasilar atelectasis    - as seen above patient has signficant posterior basal atelectasis bilaterally     - will start recrutiment maneuvers with METAneb therapy with A- QID   -PT/OT when able  Chronic diastolic stage 2 heart failure  - patient has PRN lasix ordered , there is pulmonary interstitial edema , recommend diuresis, renal function is within reference range and patient is net positive on fluid balance     Thank you for allowing me to participate in the care of this patient.   Patient/Family are satisfied with care plan and all questions have been answered.  This document was prepared using Dragon voice recognition software and may include unintentional dictation errors.     Vida Rigger, M.D.  Division of Pulmonary & Critical Care Medicine  [facility]

## 2020-04-03 NOTE — Progress Notes (Signed)
[facility] PROGRESS NOTE    Melvin Campbell  PJK:[id number] DOB: 1940/06/10 DOA: 03/31/2020 PCP: Patient, No Pcp Per      Brief Narrative:  [name] is a 80 y.o. M with HTN, PE now with chronic respiratory failure on 2.5L baseline, cognitive impairment/early dementia, alcohol use disorder, and depression who presented with shortness of breath, worse than baseline.  Lives in ALF.    Has some chronic lung disease, has been on O2 since PE in 01/2020.  In the ER, COVID negative.  CTA chest showed no PE, but did show bibasilar atelectasis and pneumonia, advanced emphysema.  Started on antibiotics and admitted.      Assessment & Plan:  Acute on chronic respiratory failure with hypoxia and hypercapnia Admitted and started on antibiotics.  Has had increasing O2 needs since then, first to 13LHF and now to 15L HF even desaturating today on that requiring HF + NRB.  He is not responding appropriately to therapy.  ABG this morning showing normal pH, chronic severe hypercarbia and hypoxia.  -Continue antibiotics/Rocephin and azithromycin  -Consult Pulmonology  -Start bronchodilators on schedule, Mucomyst -Given degree of emphysema, start systemic steroids  -Start incentive spirometry, MetaNebs, and aggressive alveolar recruitment   Chronic pulmonary embolism -Continue apixaban  Hypertension Coronary disease, primary prevention -Continue aspirin, atorvastatin -Continue hctz, losartan, metoprolol  Gout -Continue allopurinol  Depression -Continue Escitalopram            Disposition: Status is: Inpatient  Remains inpatient appropriate because:Unsafe d/c plan   Dispo:  Patient From: Assisted Living Facility  Planned Disposition: Skilled Nursing Facility  Expected discharge date: 04/06/2020  Medically stable for discharge: No         Level of care: Progressive Cardiac       MDM: The below labs and imaging reports were  reviewed and summarized above.  Medication management as above.  The patient has severe exacerbation of chronic illness   DVT prophylaxis:  apixaban (ELIQUIS) tablet 5 mg  Code Status: FULL Family Communication: daughter by phone    Consultants:   Pulmonology  Procedures:   2/13 - CTA chest -- old PE, also pneumonia, a lot of atelectasis  Antimicrobials:   Azithromycin and Rocephin 2/12   >>  Culture data:   2/12 blood culture: ngtd           Subjective: Sleepy, somewhat confused, says he is at the workshop.  No fever.  No chest pain.   Objective: Vitals:   04/03/20 1130 04/03/20 1146 04/03/20 1327 04/03/20 1535  BP: 137/80   (!) 145/86  Pulse: 80 79  68  Resp: 16 18  16   Temp: 97.8 F (36.6 C)   98.2 F (36.8 C)  TempSrc: Oral   Oral  SpO2: 93% 92% 90% 96%  Weight:      Height:        Intake/Output Summary (Last 24 hours) at 04/03/2020 1720 Last data filed at 04/03/2020 1549 Gross per 24 hour  Intake 240 ml  Output 1675 ml  Net -1435 ml   Filed Weights   03/31/20 0137  Weight: 84.8 kg    Examination: General appearance: Thin chronically ill appearing elderly adult male, alert and in no obvious distress.  Very somnolent. HEENT: Anicteric, conjunctiva pink, lids and lashes normal. No nasal deformity, discharge, epistaxis.  Lips moist, lips and tongue normal.   Skin: Warm and dry.  No jaundice.  No suspicious rashes or lesions. Cardiac: Tachcyardic, nl S1-S2, no murmurs appreciated.  Capillary refill is brisk.  JVP not visible.  2+ LE edema.  Radial pulses 2+ and symmetric. Respiratory: Mild tachypnea, slow respirations.  Lung sounds diminished, no whezing.    Abdomen: Abdomen soft.  No TTP or guarding. No ascites, distension, hepatosplenomegaly.   MSK: No deformities or effusions.Decreased MM and fat. Neuro: Awake but very sluggish and sleepy.  Generalized weakness, speech slow and slurred.  Psych: Oriented to self only, attention diminished.        Data Reviewed: I have personally reviewed following labs and imaging studies:  CBC: Recent Labs  Lab 03/31/20 0104 03/31/20 0332 04/01/20 0556 04/02/20 0633 04/03/20 0358  WBC 9.7 8.5 9.5 8.7 8.1  HGB 13.8 13.2 13.8 13.7 13.8  HCT 48.4 45.8 48.0 49.0 48.7  MCV 87.1 87.4 88.1 88.8 88.2  PLT 174 156 170 196 159   Basic Metabolic Panel: Recent Labs  Lab 03/31/20 0104 03/31/20 0332 04/01/20 0556 04/02/20 0633 04/03/20 0358  NA 139 140 139 143 142  K 3.8 3.8 4.5 4.3 4.3  CL 96* 94* 95* 93* 96*  CO2 36* 38* 37* 39* 36*  GLUCOSE 119* 105* 114* 68* 65*  BUN 39* 40* 41* 34* 21  CREATININE 0.74 0.78 0.73 0.68 0.59*  CALCIUM 9.1 9.1 9.3 9.5 9.6  MG  --   --  2.2  --   --    GFR: Estimated Creatinine Clearance: 77.9 mL/min (A) (by C-G formula based on SCr of 0.59 mg/dL (L)). Liver Function Tests: No results for input(s): AST, ALT, ALKPHOS, BILITOT, PROT, ALBUMIN in the last 168 hours. No results for input(s): LIPASE, AMYLASE in the last 168 hours. No results for input(s): AMMONIA in the last 168 hours. Coagulation Profile: Recent Labs  Lab 03/31/20 0332  INR 1.4*   Cardiac Enzymes: No results for input(s): CKTOTAL, CKMB, CKMBINDEX, TROPONINI in the last 168 hours. BNP (last 3 results) No results for input(s): PROBNP in the last 8760 hours. HbA1C: No results for input(s): HGBA1C in the last 72 hours. CBG: No results for input(s): GLUCAP in the last 168 hours. Lipid Profile: No results for input(s): CHOL, HDL, LDLCALC, TRIG, CHOLHDL, LDLDIRECT in the last 72 hours. Thyroid Function Tests: No results for input(s): TSH, T4TOTAL, FREET4, T3FREE, THYROIDAB in the last 72 hours. Anemia Panel: Recent Labs    04/02/20 0633  VITAMINB12 231   Urine analysis: No results found for: COLORURINE, APPEARANCEUR, LABSPEC, PHURINE, GLUCOSEU, HGBUR, BILIRUBINUR, KETONESUR, PROTEINUR, UROBILINOGEN, NITRITE, LEUKOCYTESUR Sepsis  Labs: @LABRCNTIP (procalcitonin:4,lacticacidven:4)  ) Recent Results (from the past 240 hour(s))  Resp Panel by RT-PCR (Flu A&B, Covid) Nasopharyngeal Swab     Status: None   Collection Time: 03/31/20  1:28 AM   Specimen: Nasopharyngeal Swab; Nasopharyngeal(NP) swabs in vial transport medium  Result Value Ref Range Status   SARS Coronavirus 2 by RT PCR NEGATIVE NEGATIVE Final    Comment: (NOTE) SARS-CoV-2 target nucleic acids are NOT DETECTED.  The SARS-CoV-2 RNA is generally detectable in upper respiratory specimens during the acute phase of infection. The lowest concentration of SARS-CoV-2 viral copies this assay can detect is 138 copies/mL. A negative result does not preclude SARS-Cov-2 infection and should not be used as the sole basis for treatment or other patient management decisions. A negative result may occur with  improper specimen collection/handling, submission of specimen other than nasopharyngeal swab, presence of viral mutation(s) within the areas targeted by this assay, and inadequate number of viral copies(<138 copies/mL). A negative result must be combined with clinical observations, patient  history, and epidemiological information. The expected result is Negative.  Fact Sheet for Patients:  BloggerCourse.comhttps://www.fda.gov/media/152166/download  Fact Sheet for Healthcare Providers:  SeriousBroker.ithttps://www.fda.gov/media/152162/download  This test is no t yet approved or cleared by the [place] FDA and  has been authorized for detection and/or diagnosis of SARS-CoV-2 by FDA under an Emergency Use Authorization (EUA). This EUA will remain  in effect (meaning this test can be used) for the duration of the COVID-19 declaration under Section 564(b)(1) of the Act, 21 U.S.C.section 360bbb-3(b)(1), unless the authorization is terminated  or revoked sooner.       Influenza A by PCR NEGATIVE NEGATIVE Final   Influenza B by PCR NEGATIVE NEGATIVE Final    Comment: (NOTE) The Xpert  Xpress SARS-CoV-2/FLU/RSV plus assay is intended as an aid in the diagnosis of influenza from Nasopharyngeal swab specimens and should not be used as a sole basis for treatment. Nasal washings and aspirates are unacceptable for Xpert Xpress SARS-CoV-2/FLU/RSV testing.  Fact Sheet for Patients: BloggerCourse.comhttps://www.fda.gov/media/152166/download  Fact Sheet for Healthcare Providers: SeriousBroker.ithttps://www.fda.gov/media/152162/download  This test is not yet approved or cleared by the [place] FDA and has been authorized for detection and/or diagnosis of SARS-CoV-2 by FDA under an Emergency Use Authorization (EUA). This EUA will remain in effect (meaning this test can be used) for the duration of the COVID-19 declaration under Section 564(b)(1) of the Act, 21 U.S.C. section 360bbb-3(b)(1), unless the authorization is terminated or revoked.  Performed at [facility], [address] [place], [place] [address]   Culture, blood (routine x 2)     Status: None (Preliminary result)   Collection Time: 03/31/20  1:54 AM   Specimen: BLOOD  Result Value Ref Range Status   Specimen Description BLOOD RIGHT ANTECUBITAL  Final   Special Requests   Final    BOTTLES DRAWN AEROBIC AND ANAEROBIC Blood Culture adequate volume   Culture   Final    NO GROWTH 3 DAYS Performed at [facility], [address] [place], [place] [address]    Report Status PENDING  Incomplete  Culture, blood (routine x 2)     Status: None (Preliminary result)   Collection Time: 03/31/20  1:55 AM   Specimen: BLOOD  Result Value Ref Range Status   Specimen Description BLOOD RIGHT ANTECUBITAL  Final   Special Requests   Final    BOTTLES DRAWN AEROBIC AND ANAEROBIC Blood Culture adequate volume   Culture   Final    NO GROWTH 3 DAYS Performed at [facility], [address] [place], [place] [address]    Report Status PENDING  Incomplete  MRSA PCR Screening     Status: None   Collection Time: 04/01/20   4:00 AM   Specimen: Nasopharyngeal  Result Value Ref Range Status   MRSA by PCR NEGATIVE NEGATIVE Final    Comment:        The GeneXpert MRSA Assay (FDA approved for NASAL specimens only), is one component of a comprehensive MRSA colonization surveillance program. It is not intended to diagnose MRSA infection nor to guide or monitor treatment for MRSA infections. Performed at [facility], [address] [place], [place] [address]          Radiology Studies: DG Chest CascadePort 1 View  Result Date: 04/03/2020 CLINICAL DATA:  Respiratory failure with hypoxia EXAM: PORTABLE CHEST 1 VIEW COMPARISON:  March 31, 2020 chest radiograph and chest CT FINDINGS: There is underlying parenchymal fibrotic type change. There are areas of scattered atelectatic  change. There is no frank edema or consolidation. Heart is upper normal in size with pulmonary vascularity normal. No adenopathy. There is aortic atherosclerosis. 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 There is right carotid artery calcification. IMPRESSION: Underlying fibrosis and scattered areas of atelectasis. No edema or airspace opacity. Stable cardiac silhouette. Aortic Atherosclerosis (ICD10-I70.0). There is right carotid artery calcification. Electronically Signed   By: Bretta Bang III M.D.   On: 04/03/2020 12:19        Scheduled Meds: . acetylcysteine  2 mL Nebulization BID  . allopurinol  300 mg Oral Daily  . apixaban  5 mg Oral BID  . aspirin EC  81 mg Oral Daily  . atorvastatin  80 mg Oral Daily  . budesonide (PULMICORT) nebulizer solution  0.25 mg Nebulization BID  . erythromycin  1 application Right Eye TID  . escitalopram  20 mg Oral Daily  . folic acid  1 mg Oral Daily  . guaiFENesin  600 mg Oral BID  . hydrochlorothiazide  25 mg Oral Daily  . ipratropium-albuterol  3 mL Nebulization Q6H  . losartan  100 mg Oral Daily  . magnesium oxide  400 mg Oral BID  . mouth rinse  15 mL Mouth Rinse  BID  . methylPREDNISolone (SOLU-MEDROL) injection  40 mg Intravenous Q6H   Followed by  . [START ON 04/06/2020] predniSONE  40 mg Oral Q breakfast  . metoprolol succinate  75 mg Oral Daily  . multivitamin with minerals  1 tablet Oral Daily  . pantoprazole  40 mg Oral Daily  . polyvinyl alcohol  1 drop Right Eye Q6H  . prednisoLONE acetate  1 drop Right Eye BID  . thiamine  250 mg Oral Daily   Continuous Infusions: . sodium chloride 250 mL (04/01/20 0520)  . azithromycin 500 mg (04/03/20 0627)  . cefTRIAXone (ROCEPHIN)  IV 2 g (04/03/20 0516)     LOS: 3 days    Time spent: 35 minutes    Alberteen Sam, MD [facility] 04/03/2020, 5:20 PM     Please page though AMION or Epic secure chat:  For [employer], Higher education careers adviser

## 2020-04-03 NOTE — Care Management Important Message (Signed)
Important Message  Patient Details  Name: Melvin Campbell MRN: [id number] Date of Birth: 02-05-1941   Medicare Important Message Given:  Yes     Johnell Comings 04/03/2020, 11:03 AM

## 2020-04-03 NOTE — Progress Notes (Signed)
[facility]    SUBJECTIVE: The patient denies chest pain. He reports feeling tired this morning as he states he did not sleep well, and continues to have shortness of breath, currently on NRB mask.   Vitals:   04/03/20 0134 04/03/20 0456 04/03/20 0737 04/03/20 0833  BP: (!) 145/78 (!) 139/93 (!) 168/94   Pulse: 79 88 83   Resp: 20  16   Temp: 98.4 F (36.9 C) 97.9 F (36.6 C) 97.7 F (36.5 C)   TempSrc: Oral Oral Oral   SpO2: 92% 93% 90% 91%  Weight:      Height:         Intake/Output Summary (Last 24 hours) at 04/03/2020 0932 Last data filed at 04/02/2020 1844 Gross per 24 hour  Intake 600 ml  Output 526 ml  Net 74 ml      PHYSICAL EXAM  General: Elderly gentleman sitting up in bed with NRB mask in place in no acute distress otherwise HEENT:  Normocephalic and atramatic Neck:  No JVD.  Lungs: normal effort of breathing on supplemental oxygen Heart: HRRR . Normal S1 and S2 without gallops or murmurs.  Abdomen: nondistended Extremities: mild bilateral lower extremity edema Neuro: Alert, oriented Psych:  Good affect, responds appropriately   LABS: Basic Metabolic Panel: Recent Labs    04/01/20 0556 04/02/20 0633 04/03/20 0358  NA 139 143 142  K 4.5 4.3 4.3  CL 95* 93* 96*  CO2 37* 39* 36*  GLUCOSE 114* 68* 65*  BUN 41* 34* 21  CREATININE 0.73 0.68 0.59*  CALCIUM 9.3 9.5 9.6  MG 2.2  --   --    Liver Function Tests: No results for input(s): AST, ALT, ALKPHOS, BILITOT, PROT, ALBUMIN in the last 72 hours. No results for input(s): LIPASE, AMYLASE in the last 72 hours. CBC: Recent Labs    04/02/20 0633 04/03/20 0358  WBC 8.7 8.1  HGB 13.7 13.8  HCT 49.0 48.7  MCV 88.8 88.2  PLT 196 159   Cardiac Enzymes: No results for input(s): CKTOTAL, CKMB, CKMBINDEX, TROPONINI in the last 72 hours. BNP: Invalid input(s): POCBNP D-Dimer: No results for input(s): DDIMER in the last 72 hours. Hemoglobin A1C: No results for input(s): HGBA1C in the last 72  hours. Fasting Lipid Panel: No results for input(s): CHOL, HDL, LDLCALC, TRIG, CHOLHDL, LDLDIRECT in the last 72 hours. Thyroid Function Tests: No results for input(s): TSH, T4TOTAL, T3FREE, THYROIDAB in the last 72 hours.  Invalid input(s): FREET3 Anemia Panel: Recent Labs    04/02/20 0633  VITAMINB12 231    No results found.   Echo LVEF 60-65%, moderate LVH  TELEMETRY: sinus rhythm  ASSESSMENT AND PLAN:  Principal Problem:   Acute on chronic respiratory failure with hypoxia (HCC) Active Problems:   Elevated troponin   Community acquired pneumonia   Bilateral lower extremity edema    1. Elevated troponin secondary to myocardial injury secondary to acute on chronic respiratory failure, trending down (), normal BNP, normal LVEF, no chest pain. Heparin drip continued for 48 hours, then discontinued. 2. Acute on chronic respiratory failure with hypoxia secondary to CAP 3. Hypertension, elevated this morning 4. Dementia 5. History of PE, heparin drip discontinued, Eliquis resumed  Recommendations: 1. Agree with current therapy 2. Continue home BP medications 3. Treatment of pneumonia per hospitalist 4. Defer further cardiac diagnostics at this time in the absence of chest pain, down trending troponin, and normal LV function  Will sign off for now; please call/haiku with questions.  Leanora Ivanoff, PA-C 04/03/2020 9:32 AM

## 2020-04-03 NOTE — Evaluation (Signed)
Physical Therapy Evaluation Patient Details Name: Melvin Campbell MRN: [id number] DOB: March 15, 1940 Today's Date: 04/03/2020   History of Present Illness  Pt admitted for acute/chronic resp failure with hypoxia now additionally diagnosed with NSTEMI. Pt with complaints of SOB symptoms. Per chart, on 2.5 L of O2 chronic (night).  Clinical Impression  Pt is a pleasant 80 year old male who was admitted for acute/chronic resp failure with hypoxia. Pt performs bed mobility, transfers, and ambulation with cga and HHA. Unsteadiness noted, would benefit from AD trial next session. Pt demonstrates deficits with strength/mobility/endurance. All mobility performed on 15L of HFNC with quick desat transferring to recliner. Follows commands well for pursed lip breathing. Would benefit from skilled PT to address above deficits and promote optimal return to PLOF. Recommend transition to HHPT upon discharge from acute hospitalization.    Follow Up Recommendations Home health PT    Equipment Recommendations  Rolling walker with 5" wheels    Recommendations for Other Services       Precautions / Restrictions Precautions Precautions: Fall Restrictions Weight Bearing Restrictions: No      Mobility  Bed Mobility Overal bed mobility: Needs Assistance Bed Mobility: Supine to Sit     Supine to sit: Min guard     General bed mobility comments: safe technique with ability to sit with upright posture. All mobility performed on 15L of HFNC with O2 sats monitored throughout.    Transfers Overall transfer level: Needs assistance Equipment used: None Transfers: Sit to/from Stand Sit to Stand: Min guard         General transfer comment: safe technique with upright posture.  Ambulation/Gait Ambulation/Gait assistance: Min guard Gait Distance (Feet): 5 Feet Assistive device: 1 person hand held assist Gait Pattern/deviations: Step-to pattern     General Gait Details: reaching for IV pole for  assistance, anticipate use of AD would be beneficial. O2 sats decrease to 83% with exertion, however quickly improve once seated. Further ambulation limited due to HFNC and O2.  Stairs            Wheelchair Mobility    Modified Rankin (Stroke Patients Only)       Balance Overall balance assessment: Needs assistance Sitting-balance support: No upper extremity supported;Feet supported Sitting balance-Leahy Scale: Good     Standing balance support: Single extremity supported Standing balance-Leahy Scale: Fair                               Pertinent Vitals/Pain Pain Assessment: No/denies pain    Home Living Family/patient expects to be discharged to:: Assisted living (the [facility])               Home Equipment: None Additional Comments: questionable historian    Prior Function Level of Independence: Independent         Comments: reports he doesn't use AD, unsure of accuracy     Hand Dominance        Extremity/Trunk Assessment   Upper Extremity Assessment Upper Extremity Assessment: Overall WFL for tasks assessed    Lower Extremity Assessment Lower Extremity Assessment: Generalized weakness (B LE grossly 4/5)       Communication   Communication: No difficulties  Cognition Arousal/Alertness: Awake/alert Behavior During Therapy: WFL for tasks assessed/performed Overall Cognitive Status: Within Functional Limits for tasks assessed  General Comments: slightly confused to situation      General Comments      Exercises Other Exercises Other Exercises: supine/seated ther-ex performed on B LE including SLRs, hip abd/add, alt. marching, and LAQ. All ther-ex performed x 10 reps with supervision and safe technique   Assessment/Plan    PT Assessment Patient needs continued PT services  PT Problem List Decreased strength;Decreased activity tolerance;Decreased balance;Decreased  mobility;Cardiopulmonary status limiting activity       PT Treatment Interventions DME instruction;Gait training;Therapeutic exercise;Balance training    PT Goals (Current goals can be found in the Care Plan section)  Acute Rehab PT Goals Patient Stated Goal: to get out of bed PT Goal Formulation: With patient Time For Goal Achievement: 04/17/20 Potential to Achieve Goals: Good    Frequency Min 2X/week   Barriers to discharge        Co-evaluation               AM-PAC PT "6 Clicks" Mobility  Outcome Measure Help needed turning from your back to your side while in a flat bed without using bedrails?: None Help needed moving from lying on your back to sitting on the side of a flat bed without using bedrails?: A Little Help needed moving to and from a bed to a chair (including a wheelchair)?: A Little Help needed standing up from a chair using your arms (e.g., wheelchair or bedside chair)?: A Little Help needed to walk in hospital room?: A Little Help needed climbing 3-5 steps with a railing? : A Lot 6 Click Score: 18    End of Session Equipment Utilized During Treatment: Gait belt;Oxygen Activity Tolerance: Patient tolerated treatment well Patient left: in chair;with chair alarm set Nurse Communication: Mobility status PT Visit Diagnosis: Unsteadiness on feet (R26.81);Muscle weakness (generalized) (M62.81);Difficulty in walking, not elsewhere classified (R26.2)    Time: 1339-1400 PT Time Calculation (min) (ACUTE ONLY): 21 min   Charges:   PT Evaluation $PT Eval Low Complexity: 1 Low PT Treatments $Therapeutic Exercise: 8-22 mins        [name], PT, DPT [phone]   Melvin Campbell 04/03/2020, 4:02 PM

## 2020-04-03 NOTE — Progress Notes (Signed)
CRITICAL VALUE ALERT  Critical Value:  pCO2 83  Date & Time Notied:  04/03/20 1216  Provider Notified: Maryfrances Bunnell

## 2020-04-04 DIAGNOSIS — J9621 Acute and chronic respiratory failure with hypoxia: Secondary | ICD-10-CM | POA: Diagnosis not present

## 2020-04-04 LAB — COMPREHENSIVE METABOLIC PANEL
ALT: 13 U/L (ref 0–44)
AST: 16 U/L (ref 15–41)
Albumin: 3.1 g/dL — ABNORMAL LOW (ref 3.5–5.0)
Alkaline Phosphatase: 57 U/L (ref 38–126)
Anion gap: 9 (ref 5–15)
BUN: 27 mg/dL — ABNORMAL HIGH (ref 8–23)
CO2: 40 mmol/L — ABNORMAL HIGH (ref 22–32)
Calcium: 9.6 mg/dL (ref 8.9–10.3)
Chloride: 91 mmol/L — ABNORMAL LOW (ref 98–111)
Creatinine, Ser: 0.62 mg/dL (ref 0.61–1.24)
GFR, Estimated: >60 mL/min (ref >60)
Glucose, Bld: 139 mg/dL — ABNORMAL HIGH (ref 70–99)
Potassium: 4.1 mmol/L (ref 3.5–5.1)
Sodium: 140 mmol/L (ref 135–145)
Total Bilirubin: 0.8 mg/dL (ref 0.3–1.2)
Total Protein: 6.2 g/dL — ABNORMAL LOW (ref 6.5–8.1)

## 2020-04-04 LAB — CBC
HCT: 47.2 % (ref 39.0–52.0)
Hemoglobin: 13.8 g/dL (ref 13.0–17.0)
MCH: 25.1 pg — ABNORMAL LOW (ref 26.0–34.0)
MCHC: 29.2 g/dL — ABNORMAL LOW (ref 30.0–36.0)
MCV: 86 fL (ref 80.0–100.0)
Platelets: 173 10*3/uL (ref 150–400)
RBC: 5.49 MIL/uL (ref 4.22–5.81)
RDW: 14 % (ref 11.5–15.5)
WBC: 6.2 10*3/uL (ref 4.0–10.5)
nRBC: 0 % (ref 0.0–0.2)

## 2020-04-04 LAB — HIV ANTIBODY (ROUTINE TESTING W REFLEX): HIV Screen 4th Generation wRfx: NONREACTIVE

## 2020-04-04 MED ORDER — ENSURE ENLIVE PO LIQD
237.0000 mL | Freq: Two times a day (BID) | ORAL | Status: DC
  Administered 2020-04-04 – 2020-04-12 (×14): 237 mL via ORAL

## 2020-04-04 MED ORDER — FUROSEMIDE 10 MG/ML IJ SOLN
40.0000 mg | Freq: Once | INTRAMUSCULAR | Status: AC
  Administered 2020-04-04: 40 mg via INTRAVENOUS
  Filled 2020-04-04: qty 4

## 2020-04-04 NOTE — Progress Notes (Signed)
Initial Nutrition Assessment  DOCUMENTATION CODES:   Not applicable  INTERVENTION:   Ensure Enlive po BID, each supplement provides 350 kcal and 20 grams of protein  MVI, thiamine and folic acid po daily   Pt at high refeed risk; recommend monitor potassium, magnesium and phosphorus labs daily until stable  NUTRITION DIAGNOSIS:   Increased nutrient needs related to chronic illness (CHF, CAP) as evidenced by estimated needs.  GOAL:   Patient will meet greater than or equal to 90% of their needs  MONITOR:   PO intake,Supplement acceptance,Labs,Weight trends,Skin,I & O's  REASON FOR ASSESSMENT:   Consult Assessment of nutrition requirement/status  ASSESSMENT:   80 year old male with past medical history of hypertension, hyperlipidemia, PE, CHF with chronic hypoxic respiratory failure on 2.5 L/min oxygen at home, ETOH abuse and possible early dementia who is admitted with CAP   Met with pt in room today. Pt reports decreased appetite and oral intake for several days pta but reports that his appetite is improving. Pt documented to be eating anywhere from sips/bites to 100% of meals. Pt's breakfast and lunch trays were left in his room and were 100% consumed. Pt reports that he does not drink supplements at home but he is willing to try chocolate Ensure. Pt is at high refeed risk r/t etoh abuse. Per chart, pt's UBW appears to be ~165lbs; pt currently up ~20lbs from his UBW.   Medications reviewed and include: allopurinol, aspirin, folic acid, Mg oxide, solu-medrol, MVI, protonix, thiamine   Labs reviewed: BUN 27(H)  NUTRITION - FOCUSED PHYSICAL EXAM:  Flowsheet Row Most Recent Value  Orbital Region No depletion  Upper Arm Region Mild depletion  Thoracic and Lumbar Region No depletion  Buccal Region No depletion  Temple Region Mild depletion  Clavicle Bone Region Mild depletion  Clavicle and Acromion Bone Region Mild depletion  Scapular Bone Region No depletion  Dorsal  Hand No depletion  Patellar Region Mild depletion  Anterior Thigh Region Mild depletion  Posterior Calf Region Mild depletion  Edema (RD Assessment) Mild  Hair Reviewed  Eyes Reviewed  Mouth Reviewed  Skin Reviewed  Nails Reviewed     Diet Order:   Diet Order            Diet heart healthy/carb modified Room service appropriate? Yes; Fluid consistency: Thin  Diet effective now                EDUCATION NEEDS:   Education needs have been addressed  Skin:  Skin Assessment: Reviewed RN Assessment (ecchymosis)  Last BM:  2/15  Height:   Ht Readings from Last 1 Encounters:  03/31/20 '5\' 7"'  (1.702 m)    Weight:   Wt Readings from Last 1 Encounters:  03/31/20 84.8 kg    Ideal Body Weight:  67.2 kg  BMI:  Body mass index is 29.29 kg/m.  Estimated Nutritional Needs:   Kcal:  1900-2200kcal/day  Protein:  95-110g/day  Fluid:  1.7-2.0L/day  Koleen Distance MS, RD, LDN Please refer to [facility] for RD and/or RD on-call/weekend/after hours pager

## 2020-04-04 NOTE — Progress Notes (Signed)
Pulmonary Medicine          Date: 04/04/2020,   MRN# [id number] Melvin Campbell 02-20-1940     AdmissionWeight: 84.8 kg                 CurrentWeight: 84.8 kg   Referring physician: Dr Maryfrances Bunnell   CHIEF COMPLAINT:   Chronic hypercapnic and hypoxemic respiratory failure   HISTORY OF PRESENT ILLNESS   Melvin Campbell is a 80 y.o. male with medical history significant for hypertension, dyslipidemia and PE with chronic respiratory failure on home O2 at 2.5 L/min by nasal cannula, and dementia who presented to the emergency room with acute onset of worsening dyspnea at his skilled nursing facility with associated hypoxemia with a pulse oximetry of 87% on his O2 at 2.5 L and wheezing.  Pulse oximetry improved to 93% on 4 L of O2.  He denied chest pain nausea vomiting or diaphoresis or palpitations.  He admited to chronic lower extremity edema.  No fever or chills.  No bleeding diathesis.  No dysuria, oliguria or hematuria or flank pain.   Labs revealed a BUN of 39 creatinine 0.74, BNP of 23.2 and high-sensitivity troponin of 1263 with lactic acid of 1 and CBC levels unremarkable.  Influenza antigens and COVID-19 PCR came back negative.  Blood cultures were drawn.  INR is 1.4 and PT 16.8 with PTT 45. Chest CTA revealed no PE.  Showed cardiomegaly and dilated main pulmonary artery consistent with pulmonary artery hypertension by basal independent right middle lobe consolidation with volume loss likely due to atelectasis and differential diagnosis would include pneumonia.  Aortic atherosclerosis tortuosity and moderate emphysema. Patient has been on BIPAP and has had recurrent hypercanpnia  04/04/20- patient is stable, he is speaking in full sentences. HFNC weaned to 12 and can be further weaned. Plan to continue current care.  His steroids are being weaned.    PAST MEDICAL HISTORY   History reviewed. No pertinent past medical history.   SURGICAL HISTORY   No available surgical  data available at this time  FAMILY HISTORY   No family history on file.   SOCIAL HISTORY   Social History   Tobacco Use  . Smoking status: Never Smoker  . Smokeless tobacco: Never Used     MEDICATIONS    Home Medication:    Current Medication:  Current Facility-Administered Medications:  .  0.9 %  sodium chloride infusion, , Intravenous, PRN, Mansy, Jan A, MD, Last Rate: 4 mL/hr at 04/01/20 0520, 250 mL at 04/01/20 0520 .  acetaminophen (TYLENOL) tablet 650 mg, 650 mg, Oral, Q4H PRN, Mansy, Jan A, MD, 650 mg at 03/31/20 0742 .  acetylcysteine (MUCOMYST) 20 % nebulizer / oral solution 2 mL, 2 mL, Nebulization, BID, Danford, Earl Lites, MD, 2 mL at 04/04/20 0832 .  albuterol (PROVENTIL) (2.5 MG/3ML) 0.083% nebulizer solution 2.5 mg, 2.5 mg, Nebulization, Q2H PRN, Danford, Earl Lites, MD .  allopurinol (ZYLOPRIM) tablet 300 mg, 300 mg, Oral, Daily, Mansy, Jan A, MD, 300 mg at 04/03/20 1003 .  ALPRAZolam Prudy Feeler) tablet 0.25 mg, 0.25 mg, Oral, BID PRN, Mansy, Jan A, MD, 0.25 mg at 04/03/20 2110 .  apixaban (ELIQUIS) tablet 5 mg, 5 mg, Oral, BID, Ronnald Ramp, RPH, 5 mg at 04/03/20 2110 .  aspirin EC tablet 81 mg, 81 mg, Oral, Daily, Leanora Ivanoff, PA-C, 81 mg at 04/03/20 1002 .  atorvastatin (LIPITOR) tablet 80 mg, 80 mg, Oral, Daily, Mansy, Jan A, MD, 80 mg at  04/03/20 1001 .  budesonide (PULMICORT) nebulizer solution 0.25 mg, 0.25 mg, Nebulization, BID, Esaw GrandchildGriffith, Kelly A, DO, 0.25 mg at 04/04/20 [id number] .  erythromycin ophthalmic ointment 1 application, 1 application, Right Eye, TID, Mansy, Vernetta HoneyJan A, MD, 1 application at 04/03/20 2112 .  escitalopram (LEXAPRO) tablet 20 mg, 20 mg, Oral, Daily, Mansy, Jan A, MD, 20 mg at 04/03/20 1003 .  folic acid (FOLVITE) tablet 1 mg, 1 mg, Oral, Daily, Mansy, Jan A, MD, 1 mg at 04/03/20 1001 .  furosemide (LASIX) tablet 20 mg, 20 mg, Oral, Daily PRN, Mansy, Jan A, MD, 20 mg at 03/31/20 0742 .  guaiFENesin (MUCINEX) 12 hr tablet 600 mg, 600  mg, Oral, BID, Mansy, Jan A, MD, 600 mg at 04/03/20 2110 .  hydrochlorothiazide (HYDRODIURIL) tablet 25 mg, 25 mg, Oral, Daily, Mansy, Jan A, MD, 25 mg at 04/03/20 1001 .  ipratropium-albuterol (DUONEB) 0.5-2.5 (3) MG/3ML nebulizer solution 3 mL, 3 mL, Nebulization, Q6H, Danford, Earl Liteshristopher P, MD, 3 mL at 04/04/20 0832 .  losartan (COZAAR) tablet 100 mg, 100 mg, Oral, Daily, Mansy, Jan A, MD, 100 mg at 04/03/20 1001 .  magnesium oxide (MAG-OX) tablet 400 mg, 400 mg, Oral, BID, Mansy, Jan A, MD, 400 mg at 04/03/20 2110 .  MEDLINE mouth rinse, 15 mL, Mouth Rinse, BID, Mansy, Jan A, MD, 15 mL at 04/01/20 2105 .  methylPREDNISolone sodium succinate (SOLU-MEDROL) 40 mg/mL injection 40 mg, 40 mg, Intravenous, Q6H, 40 mg at 04/04/20 0249 **FOLLOWED BY** [START ON 04/06/2020] predniSONE (DELTASONE) tablet 40 mg, 40 mg, Oral, Q breakfast, Danford, Earl Liteshristopher P, MD .  metoprolol succinate (TOPROL-XL) 24 hr tablet 75 mg, 75 mg, Oral, Daily, Mansy, Jan A, MD, 75 mg at 04/03/20 1000 .  multivitamin with minerals tablet 1 tablet, 1 tablet, Oral, Daily, Mansy, Jan A, MD, 1 tablet at 04/03/20 1001 .  nitroGLYCERIN (NITROSTAT) SL tablet 0.4 mg, 0.4 mg, Sublingual, Q5 Min x 3 PRN, Mansy, Jan A, MD .  ondansetron [facility](ZOFRAN) injection 4 mg, 4 mg, Intravenous, Q6H PRN, Mansy, Jan A, MD .  pantoprazole (PROTONIX) EC tablet 40 mg, 40 mg, Oral, Daily, Danford, Earl Liteshristopher P, MD, 40 mg at 04/03/20 1255 .  polyvinyl alcohol (LIQUIFILM TEARS) 1.4 % ophthalmic solution 1 drop, 1 drop, Right Eye, Q6H, Mansy, Jan A, MD, 1 drop at 04/04/20 0249 .  prednisoLONE acetate (PRED FORTE) 1 % ophthalmic suspension 1 drop, 1 drop, Right Eye, BID, Mansy, Jan A, MD, 1 drop at 04/03/20 2112 .  thiamine tablet 250 mg, 250 mg, Oral, Daily, Esaw GrandchildGriffith, Kelly A, DO, 250 mg at 04/03/20 1004 .  zolpidem (AMBIEN) tablet 5 mg, 5 mg, Oral, QHS PRN, Mansy, Jan A, MD, 5 mg at 04/02/20 2142    ALLERGIES   Amlodipine     REVIEW OF SYSTEMS     Review of Systems:  Gen:  Denies  fever, sweats, chills weigh loss  HEENT: Denies blurred vision, double vision, ear pain, eye pain, hearing loss, nose bleeds, sore throat Cardiac:  No dizziness, chest pain or heaviness, chest tightness,edema Resp:   Denies cough or sputum porduction, shortness of breath,wheezing, hemoptysis,  Gi: Denies swallowing difficulty, stomach pain, nausea or vomiting, diarrhea, constipation, bowel incontinence Gu:  Denies bladder incontinence, burning urine Ext:   Denies Joint pain, stiffness or swelling Skin: Denies  skin rash, easy bruising or bleeding or hives Endoc:  Denies polyuria, polydipsia , polyphagia or weight change Psych:   Denies depression, insomnia or hallucinations   Other:  All other  systems negative   VS: BP 129/63 (BP Location: Right Arm)   Pulse 67   Temp 98 F (36.7 C) (Oral)   Resp 16   Ht 5\' 7"  (1.702 m)   Wt 84.8 kg   SpO2 90%   BMI 29.29 kg/m      PHYSICAL EXAM    GENERAL:NAD, no fevers, chills, no weakness no fatigue HEAD: Normocephalic, atraumatic.  EYES: Pupils equal, round, reactive to light. Extraocular muscles intact. No scleral icterus.  MOUTH: Moist mucosal membrane. Dentition intact. No abscess noted.  EAR, NOSE, THROAT: Clear without exudates. No external lesions.  NECK: Supple. No thyromegaly. No nodules. No JVD.  PULMONARY: crackles at bases bilaterally  CARDIOVASCULAR: S1 and S2. Regular rate and rhythm. No murmurs, rubs, or gallops. No edema. Pedal pulses 2+ bilaterally.  GASTROINTESTINAL: Soft, nontender, nondistended. No masses. Positive bowel sounds. No hepatosplenomegaly.  MUSCULOSKELETAL: No swelling, clubbing, or edema. Range of motion full in all extremities.  NEUROLOGIC: Cranial nerves II through XII are intact. No gross focal neurological deficits. Sensation intact. Reflexes intact.  SKIN: No ulceration, lesions, rashes, or cyanosis. Skin warm and dry. Turgor intact.  PSYCHIATRIC: Mood, affect  within normal limits. The patient is awake, alert and oriented x 3. Insight, judgment intact.       IMAGING    DG Chest 2 View  Result Date: 03/31/2020 CLINICAL DATA:  Shortness of breath. EXAM: CHEST - 2 VIEW COMPARISON:  None available. FINDINGS: Low lung volumes limit assessment. Heart size upper normal. Aortic atherosclerosis and tortuosity. There are streaky bibasilar opacities. Mild diffuse peribronchial thickening. No significant pleural effusion. No pneumothorax. Degenerative change throughout the spine. Right shoulder arthroplasty. No acute osseous abnormalities are seen. IMPRESSION: 1. Low lung volumes with streaky bibasilar opacities, favoring atelectasis. Mild diffuse peribronchial thickening. 2. Diffuse aortic atherosclerosis. Aortic tortuosity. Aortic Atherosclerosis (ICD10-I70.0). Electronically Signed   By: 04/02/2020 M.D.   On: 03/31/2020 01:42   CT Angio Chest PE W/Cm &/Or Wo Cm  Result Date: 03/31/2020 CLINICAL DATA:  PE suspected, high prob PE in 01/2020; hypoxic EXAM: CT ANGIOGRAPHY CHEST WITH CONTRAST TECHNIQUE: Multidetector CT imaging of the chest was performed using the standard protocol during bolus administration of intravenous contrast. Multiplanar CT image reconstructions and MIPs were obtained to evaluate the vascular anatomy. CONTRAST:  02/2020 OMNIPAQUE IOHEXOL 350 MG/ML SOLN COMPARISON:  Chest radiograph earlier today.  No prior CT available. FINDINGS: Cardiovascular: No evidence of acute or chronic pulmonary embolus. There are no intraluminal pulmonary arterial filling defects. No intraluminal webs. Dilated main pulmonary artery at 4 cm. Multi chamber cardiomegaly. Coronary artery calcifications. Aortic atherosclerosis and tortuosity. Cannot assess for dissection given phase of contrast tailored to pulmonary artery evaluation. No pericardial effusion. Mediastinum/Nodes: Prominent right lower paratracheal node measures 10 mm. 10 mm left hilar node. No visualized  thyroid nodule. Patulous esophagus. Lungs/Pleura: Bibasilar and dependent right middle lobe consolidation. Bibasilar volume loss. Moderate emphysema. No septal thickening or pulmonary edema no significant pleural effusion. Upper Abdomen: Left perinephric edema is partially included, nonspecific. Lobulated splenic contours. Musculoskeletal: Diffuse thoracic spondylosis. Subacute or remote lower lateral left rib fractures with callus formation. Right shoulder arthroplasty. Review of the MIP images confirms the above findings. IMPRESSION: 1. No pulmonary embolus. Cardiomegaly. Dilated main pulmonary artery consistent with pulmonary arterial hypertension. 2. Bibasilar and dependent right middle lobe consolidation with volume loss. Atelectasis is favored, however sterility is indeterminate by imaging, recommend correlation for pneumonia symptoms. 3. Nonspecific left perinephric edema in the upper abdomen is partially  included, may be chronic or seen with urinary tract infection. 4. Aortic atherosclerosis and tortuosity. 5. Moderate emphysema Aortic Atherosclerosis (ICD10-I70.0) and Emphysema (ICD10-J43.9). Electronically Signed   By: Narda Rutherford M.D.   On: 03/31/2020 03:43   DG Chest Port 1 View  Result Date: 04/03/2020 CLINICAL DATA:  Respiratory failure with hypoxia EXAM: PORTABLE CHEST 1 VIEW COMPARISON:  March 31, 2020 chest radiograph and chest CT FINDINGS: There is underlying parenchymal fibrotic type change. There are areas of scattered atelectatic change. There is no frank edema or consolidation. Heart is upper normal in size with pulmonary vascularity normal. No adenopathy. There is aortic atherosclerosis. 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 There is right carotid artery calcification. IMPRESSION: Underlying fibrosis and scattered areas of atelectasis. No edema or airspace opacity. Stable cardiac silhouette. Aortic Atherosclerosis (ICD10-I70.0). There is right carotid artery  calcification. Electronically Signed   By: Bretta Bang III M.D.   On: 04/03/2020 12:19   ECHOCARDIOGRAM COMPLETE  Result Date: 04/01/2020    ECHOCARDIOGRAM REPORT   Patient Name:   Melvin Campbell Date of Exam: 04/01/2020 Medical Rec #:  [id number]        Height:       67.0 in Accession #:    [id number]       Weight:       187.0 lb Date of Birth:  1940/06/05         BSA:          1.965 m Patient Age:    79 years         BP:           125/61 mmHg Patient Gender: M                HR:           58 bpm. Exam Location:  [facility] Procedure: 2D Echo, Cardiac Doppler and Color Doppler Indications:     NSTEMI I21.4  History:         Patient has no prior history of Echocardiogram examinations. No                  medical history on file.  Sonographer:     Cristela Blue RDCS (AE) Referring Phys:  [id number] Vernetta Honey MANSY Diagnosing Phys: Lorine Bears MD  Sonographer Comments: Suboptimal apical window. IMPRESSIONS  1. Left ventricular ejection fraction, by estimation, is 60 to 65%. The left ventricle has normal function. The left ventricle has no regional wall motion abnormalities. There is moderate left ventricular hypertrophy. Left ventricular diastolic parameters are consistent with Grade II diastolic dysfunction (pseudonormalization).  2. Right ventricular systolic function is normal. The right ventricular size is normal. Tricuspid regurgitation signal is inadequate for assessing PA pressure.  3. Left atrial size was mildly dilated.  4. The mitral valve is normal in structure. No evidence of mitral valve regurgitation. No evidence of mitral stenosis. Moderate mitral annular calcification.  5. The aortic valve is normal in structure. Aortic valve regurgitation is not visualized. Mild aortic valve stenosis.  6. Aortic dilatation noted. There is mild dilatation of the ascending aorta, measuring 41 mm.  7. The inferior vena cava is dilated in size with <50% respiratory variability, suggesting right atrial pressure of 15 mmHg.  FINDINGS  Left Ventricle: Left ventricular ejection fraction, by estimation, is 60 to 65%. The left ventricle has normal function. The left ventricle has no regional wall motion abnormalities. The left ventricular internal cavity size was normal in size.  There is  moderate left ventricular hypertrophy. Left ventricular diastolic parameters are consistent with Grade II diastolic dysfunction (pseudonormalization). Right Ventricle: The right ventricular size is normal. No increase in right ventricular wall thickness. Right ventricular systolic function is normal. Tricuspid regurgitation signal is inadequate for assessing PA pressure. Left Atrium: Left atrial size was mildly dilated. Right Atrium: Right atrial size was normal in size. Pericardium: There is no evidence of pericardial effusion. Mitral Valve: The mitral valve is normal in structure. Moderate mitral annular calcification. No evidence of mitral valve regurgitation. No evidence of mitral valve stenosis. Tricuspid Valve: The tricuspid valve is normal in structure. Tricuspid valve regurgitation is not demonstrated. No evidence of tricuspid stenosis. Aortic Valve: The aortic valve is normal in structure. Aortic valve regurgitation is not visualized. Mild aortic stenosis is present. Aortic valve mean gradient measures 4.5 mmHg. Aortic valve peak gradient measures 7.7 mmHg. Aortic valve area, by VTI measures 2.13 cm. Pulmonic Valve: The pulmonic valve was normal in structure. Pulmonic valve regurgitation is not visualized. No evidence of pulmonic stenosis. Aorta: The aortic root is normal in size and structure and aortic dilatation noted. There is mild dilatation of the ascending aorta, measuring 41 mm. Venous: The inferior vena cava is dilated in size with less than 50% respiratory variability, suggesting right atrial pressure of 15 mmHg. IAS/Shunts: No atrial level shunt detected by color flow Doppler.  LEFT VENTRICLE PLAX 2D LVIDd:         4.14 cm  Diastology  LVIDs:         2.69 cm  LV e' medial:    4.24 cm/s LV PW:         1.14 cm  LV E/e' medial:  22.6 LV IVS:        1.16 cm  LV e' lateral:   4.57 cm/s LVOT diam:     2.20 cm  LV E/e' lateral: 21.0 LV SV:         73 LV SV Index:   37 LVOT Area:     3.80 cm  RIGHT VENTRICLE RV Basal diam:  3.37 cm RV S prime:     9.46 cm/s TAPSE (M-mode): 3.8 cm LEFT ATRIUM             Index       RIGHT ATRIUM           Index LA diam:        3.90 cm 1.98 cm/m  RA Area:     10.60 cm LA Vol (A2C):   78.4 ml 39.89 ml/m RA Volume:   20.70 ml  10.53 ml/m LA Vol (A4C):   70.5 ml 35.87 ml/m LA Biplane Vol: 79.9 ml 40.66 ml/m  AORTIC VALVE                    PULMONIC VALVE AV Area (Vmax):    2.13 cm     PV Vmax:        0.69 m/s AV Area (Vmean):   2.18 cm     PV Peak grad:   1.9 mmHg AV Area (VTI):     2.13 cm     RVOT Peak grad: 2 mmHg AV Vmax:           138.50 cm/s AV Vmean:          101.500 cm/s AV VTI:            0.342 m AV Peak Grad:      7.7 mmHg AV  Mean Grad:      4.5 mmHg LVOT Vmax:         77.70 cm/s LVOT Vmean:        58.300 cm/s LVOT VTI:          0.192 m LVOT/AV VTI ratio: 0.56  AORTA Ao Root diam: 4.00 cm MITRAL VALVE                TRICUSPID VALVE MV Area (PHT): 3.95 cm     TR Peak grad:   8.4 mmHg MV Decel Time: 192 msec     TR Vmax:        145.00 cm/s MV E velocity: 96.00 cm/s MV A velocity: 113.00 cm/s  SHUNTS MV E/A ratio:  0.85         Systemic VTI:  0.19 m                             Systemic Diam: 2.20 cm Lorine Bears MD Electronically signed by Lorine Bears MD Signature Date/Time: 04/01/2020/10:36:14 AM    Final            ASSESSMENT/PLAN   Acute on chronic hypercapnic hypoxemic respiratory failure   - patient has borderline normal chronically hypercapnic respiratory compensated arterial blood gas    - he is not morbidly obese and does not have obesity hypoventilation syndrome or thoracic restriction associated chronic lung disease requiring NIV   - patient does not technically need BIPAP at this  time but should be evaluated again once in chronic stable state to evaluate ventilation   - would recommend to continue current carepath including empiric therapy for CAP via antibiotics and Acute COPD exacerbation with solumedrol, nebulizer therapy and antimicrobials  -will obtain spirometry with graph overnight -patient currently on 13L/min HFNC -discussed care plan with Attending physician and RN as group.     Bibasilar atelectasis    - as seen above patient has signficant posterior basal atelectasis bilaterally     - will start recrutiment maneuvers with METAneb therapy with A- QID   -PT/OT when able    Chronic diastolic stage 2 heart failure  - patient has PRN lasix ordered , there is pulmonary interstitial edema , recommend diuresis, renal function is within reference range and patient is net positive on fluid balance     Thank you for allowing me to participate in the care of this patient.   Patient/Family are satisfied with care plan and all questions have been answered.  This document was prepared using Dragon voice recognition software and may include unintentional dictation errors.     Vida Rigger, M.D.  Division of Pulmonary & Critical Care Medicine  [facility]

## 2020-04-04 NOTE — Progress Notes (Signed)
Physical Therapy Treatment Patient Details Name: Melvin Campbell MRN: [id number] DOB: 1940-03-24 Today's Date: 04/04/2020    History of Present Illness Pt admitted for acute/chronic resp failure with hypoxia now additionally diagnosed with NSTEMI. Pt with complaints of SOB symptoms. Per chart, on 2.5 L of O2 chronic (night).    PT Comments    Pt is making good progress towards goals with ability to ambulate to chair while on HFNC with sats at 87% on 11L of O2. Improved safety with use of RW. Good endurance with there-ex. Also educated on IS with ability to perform 10 reps of consistently. Will continue to progress as able.   Follow Up Recommendations  Home health PT     Equipment Recommendations  Rolling walker with 5" wheels    Recommendations for Other Services       Precautions / Restrictions Precautions Precautions: Fall Restrictions Weight Bearing Restrictions: No    Mobility  Bed Mobility Overal bed mobility: Needs Assistance Bed Mobility: Supine to Sit     Supine to sit: Supervision     General bed mobility comments: improved sequencing and technique. Upright posture noted. All mobility performed on 11L of HFNC with sats monitored throughout    Transfers Overall transfer level: Needs assistance Equipment used: Rolling walker (2 wheeled) Transfers: Sit to/from Stand Sit to Stand: Min guard         General transfer comment: cues for technique. Once standing, upright posture noted  Ambulation/Gait Ambulation/Gait assistance: Min guard Gait Distance (Feet): 5 Feet Assistive device: Rolling walker (2 wheeled) Gait Pattern/deviations: Step-to pattern     General Gait Details: Improved safety from previous session. O2 sats decreased to 87% with exertion, limited secondary to HFNC.   Stairs             Wheelchair Mobility    Modified Rankin (Stroke Patients Only)       Balance Overall balance assessment: Needs  assistance Sitting-balance support: No upper extremity supported;Feet supported Sitting balance-Leahy Scale: Good     Standing balance support: Bilateral upper extremity supported Standing balance-Leahy Scale: Good                              Cognition Arousal/Alertness: Awake/alert Behavior During Therapy: WFL for tasks assessed/performed Overall Cognitive Status: Within Functional Limits for tasks assessed                                        Exercises Other Exercises Other Exercises: supine/seated ther-ex performed on B LE including SLRs, SAQ, alt. marching, and LAQ. All ther-ex performed x 10 reps with supervision and safe technique Other Exercises: performed 2 reps of 5 time STS with cga. On 2nd rep, timed with ability to complete 5TSTS in 14 seconds. Slight SOB symptoms noted. O2 sats WNL    General Comments        Pertinent Vitals/Pain Pain Assessment: No/denies pain    Home Living                      Prior Function            PT Goals (current goals can now be found in the care plan section) Acute Rehab PT Goals Patient Stated Goal: to get out of bed PT Goal Formulation: With patient Time For Goal Achievement: 04/17/20 Potential to Achieve Goals:  Good Progress towards PT goals: Progressing toward goals    Frequency    Min 2X/week      PT Plan Current plan remains appropriate    Co-evaluation              AM-PAC PT "6 Clicks" Mobility   Outcome Measure  Help needed turning from your back to your side while in a flat bed without using bedrails?: None Help needed moving from lying on your back to sitting on the side of a flat bed without using bedrails?: A Little Help needed moving to and from a bed to a chair (including a wheelchair)?: A Little Help needed standing up from a chair using your arms (e.g., wheelchair or bedside chair)?: A Little Help needed to walk in hospital room?: A Little Help needed  climbing 3-5 steps with a railing? : A Lot 6 Click Score: 18    End of Session Equipment Utilized During Treatment: Gait belt;Oxygen Activity Tolerance: Patient tolerated treatment well Patient left: in chair;with chair alarm set Nurse Communication: Mobility status PT Visit Diagnosis: Unsteadiness on feet (R26.81);Muscle weakness (generalized) (M62.81);Difficulty in walking, not elsewhere classified (R26.2)     Time: 9407-6808 PT Time Calculation (min) (ACUTE ONLY): 23 min  Charges:  $Gait Training: 8-22 mins $Therapeutic Exercise: 8-22 mins                     [name], PT, DPT [phone]    [name] 04/04/2020, 4:26 PM

## 2020-04-04 NOTE — Progress Notes (Addendum)
[facility] PROGRESS NOTE    Melvin Campbell  IRJ:[id number] DOB: 17-Jul-1940 DOA: 03/31/2020 PCP: Patient, No Pcp Per      Brief Narrative:  Melvin Campbell is a 80 y.o. M with HTN, PE now with chronic respiratory failure on 2.5L baseline, cognitive impairment/early dementia, alcohol use disorder, and depression who presented with shortness of breath, worse than baseline.  Lives in ALF.    Has some chronic lung disease, has been on O2 since PE in 01/2020.  In the ER, COVID negative.  CTA chest showed no PE, but did show bibasilar atelectasis and pneumonia, advanced emphysema.  Started on antibiotics and admitted.         Assessment & Plan:  Acute on chronic respiratory failure with hypoxia and hypercapnia Patient has no prior diagnosis of significant lung disease, but on exam he has evidence of chronic hypoxia, and his CT scan shows obvious emphysema, probably occupational, cardiology as well as severe atelectasis and some pneumonia and pulmonary edema.  We are weaning his oxygen down somewhat, but he still desats significantly with weaning further than 10 L/min.  He has a chronic hypercarbia and hypoxia  Continue admitted and started on antibiotics.  Completed 5 days antibiotics.  -Continue steroids -Continue bronchodilators, Mucomyst, scheduled MetaNebs, incentive spirometry -Consult pulmonology, appreciate cares  -Repeat Lasix      Chronic pulmonary embolism -Continue apixaban  Hypertension Coronary disease, primary prevention Blood pressure somewhat elevated today on steroids -Continue aspirin, atorvastatin, HCTZ, losartan, metoprolol    Gout No evidence of flare -Continue allopurinol  Depression -Continue Escitalopram  Moderate protein calorie malnutrition As evidenced by severe chronic illness, poor p.o. intake -Monitor Phos, mag, and potassium daily -Start ensure         Disposition: Status is: Inpatient  Remains  inpatient appropriate because: Still severely hypoxic   Dispo:  Patient From: Assisted Living Facility  Planned Disposition: Skilled Nursing Facility  Expected discharge date: 04/06/2020  Medically stable for discharge: No     The patient was admitted with hypoxia, diagnosed with pneumonia, did not respond to treatment.  Since then we have escalated therapy to include steroids, bronchodilators, aggressive alveolar recruitment.  Hopefully he will start to improve over the next 24 to 48 hours with ongoing diuresis, not able to wean down to 6 to 8 L of oxygen be able to discharge to SNF.    Level of care: Progressive Cardiac       MDM: The below labs and imaging reports were reviewed and summarized above.  Medication management as above.     DVT prophylaxis:  apixaban (ELIQUIS) tablet 5 mg  Code Status: FULL Family Communication: daughter by phone    Consultants:   Pulmonology  Procedures:   2/13 - CTA chest -- old PE, also pneumonia, a lot of atelectasis  Antimicrobials:   Azithromycin and Rocephin 2/12   >>2/16  Culture data:   2/12 blood culture: no growth           Subjective: Somewhat confused, says that his incentive spirometer is a "urinal".  Denies dyspnea, no sputum, no fever, denies pain.       Objective: Vitals:   04/04/20 0832 04/04/20 1013 04/04/20 1056 04/04/20 1547  BP:  (!) 141/92 (!) 163/90 (!) 157/94  Pulse:  72 65 68  Resp:   16 16  Temp:   97.8 F (36.6 C) 98.1 F (36.7 C)  TempSrc:   Oral Oral  SpO2: 90% 96% 94% 90%  Weight:  Height:        Intake/Output Summary (Last 24 hours) at 04/04/2020 1611 Last data filed at 04/04/2020 1545 Gross per 24 hour  Intake 240 ml  Output 950 ml  Net -710 ml   Filed Weights   03/31/20 0137  Weight: 84.8 kg    Examination: General appearance: Chronically ill-appearing elderly adult male, very thin, lying in bed, interactive and makes eye contact     HEENT:    Skin:   Cardiac: RRR, no murmurs, 1+ extremity edema Respiratory: Desats with exertion, lung sounds diminished, no wheezing. Abdomen: Abdomen soft, no tenderness palpation or guarding, no ascites or distention MSK: No deformities or effusion, severely decreased muscle mass and fat.  Severe clubbing.  Some acral cyanosis. Neuro: Awake but sleepy, generalized weakness, speech slow.  No slurred speech.  No focal weakness. Psych: Oriented to self, attention diminished.    Data Reviewed: I have personally reviewed following labs and imaging studies:  CBC: Recent Labs  Lab 03/31/20 0332 04/01/20 0556 04/02/20 0633 04/03/20 0358 04/04/20 0952  WBC 8.5 9.5 8.7 8.1 6.2  HGB 13.2 13.8 13.7 13.8 13.8  HCT 45.8 48.0 49.0 48.7 47.2  MCV 87.4 88.1 88.8 88.2 86.0  PLT 156 170 196 159 173   Basic Metabolic Panel: Recent Labs  Lab 03/31/20 0332 04/01/20 0556 04/02/20 0633 04/03/20 0358 04/04/20 0952  NA 140 139 143 142 140  K 3.8 4.5 4.3 4.3 4.1  CL 94* 95* 93* 96* 91*  CO2 38* 37* 39* 36* 40*  GLUCOSE 105* 114* 68* 65* 139*  BUN 40* 41* 34* 21 27*  CREATININE 0.78 0.73 0.68 0.59* 0.62  CALCIUM 9.1 9.3 9.5 9.6 9.6  MG  --  2.2  --   --   --    GFR: Estimated Creatinine Clearance: 77.9 mL/min (by C-G formula based on SCr of 0.62 mg/dL). Liver Function Tests: Recent Labs  Lab 04/04/20 0952  AST 16  ALT 13  ALKPHOS 57  BILITOT 0.8  PROT 6.2*  ALBUMIN 3.1*   No results for input(s): LIPASE, AMYLASE in the last 168 hours. No results for input(s): AMMONIA in the last 168 hours. Coagulation Profile: Recent Labs  Lab 03/31/20 0332  INR 1.4*   Cardiac Enzymes: No results for input(s): CKTOTAL, CKMB, CKMBINDEX, TROPONINI in the last 168 hours. BNP (last 3 results) No results for input(s): PROBNP in the last 8760 hours. HbA1C: No results for input(s): HGBA1C in the last 72 hours. CBG: No results for input(s): GLUCAP in the last 168 hours. Lipid Profile: No results for input(s):  CHOL, HDL, LDLCALC, TRIG, CHOLHDL, LDLDIRECT in the last 72 hours. Thyroid Function Tests: No results for input(s): TSH, T4TOTAL, FREET4, T3FREE, THYROIDAB in the last 72 hours. Anemia Panel: Recent Labs    04/02/20 0633  VITAMINB12 231   Urine analysis: No results found for: COLORURINE, APPEARANCEUR, LABSPEC, PHURINE, GLUCOSEU, HGBUR, BILIRUBINUR, KETONESUR, PROTEINUR, UROBILINOGEN, NITRITE, LEUKOCYTESUR Sepsis Labs: @LABRCNTIP (procalcitonin:4,lacticacidven:4)  ) Recent Results (from the past 240 hour(s))  Resp Panel by RT-PCR (Flu A&B, Covid) Nasopharyngeal Swab     Status: None   Collection Time: 03/31/20  1:28 AM   Specimen: Nasopharyngeal Swab; Nasopharyngeal(NP) swabs in vial transport medium  Result Value Ref Range Status   SARS Coronavirus 2 by RT PCR NEGATIVE NEGATIVE Final    Comment: (NOTE) SARS-CoV-2 target nucleic acids are NOT DETECTED.  The SARS-CoV-2 RNA is generally detectable in upper respiratory specimens during the acute phase of infection. The lowest concentration of  SARS-CoV-2 viral copies this assay can detect is 138 copies/mL. A negative result does not preclude SARS-Cov-2 infection and should not be used as the sole basis for treatment or other patient management decisions. A negative result may occur with  improper specimen collection/handling, submission of specimen other than nasopharyngeal swab, presence of viral mutation(s) within the areas targeted by this assay, and inadequate number of viral copies(<138 copies/mL). A negative result must be combined with clinical observations, patient history, and epidemiological information. The expected result is Negative.  Fact Sheet for Patients:  BloggerCourse.comhttps://www.fda.gov/media/152166/download  Fact Sheet for Healthcare Providers:  SeriousBroker.ithttps://www.fda.gov/media/152162/download  This test is no t yet approved or cleared by the [place] FDA and  has been authorized for detection and/or diagnosis of SARS-CoV-2  by FDA under an Emergency Use Authorization (EUA). This EUA will remain  in effect (meaning this test can be used) for the duration of the COVID-19 declaration under Section 564(b)(1) of the Act, 21 U.S.C.section 360bbb-3(b)(1), unless the authorization is terminated  or revoked sooner.       Influenza A by PCR NEGATIVE NEGATIVE Final   Influenza B by PCR NEGATIVE NEGATIVE Final    Comment: (NOTE) The Xpert Xpress SARS-CoV-2/FLU/RSV plus assay is intended as an aid in the diagnosis of influenza from Nasopharyngeal swab specimens and should not be used as a sole basis for treatment. Nasal washings and aspirates are unacceptable for Xpert Xpress SARS-CoV-2/FLU/RSV testing.  Fact Sheet for Patients: BloggerCourse.comhttps://www.fda.gov/media/152166/download  Fact Sheet for Healthcare Providers: SeriousBroker.ithttps://www.fda.gov/media/152162/download  This test is not yet approved or cleared by the [place] FDA and has been authorized for detection and/or diagnosis of SARS-CoV-2 by FDA under an Emergency Use Authorization (EUA). This EUA will remain in effect (meaning this test can be used) for the duration of the COVID-19 declaration under Section 564(b)(1) of the Act, 21 U.S.C. section 360bbb-3(b)(1), unless the authorization is terminated or revoked.  Performed at [facility], [address] [place], [place] [address]   Culture, blood (routine x 2)     Status: None (Preliminary result)   Collection Time: 03/31/20  1:54 AM   Specimen: BLOOD  Result Value Ref Range Status   Specimen Description BLOOD RIGHT ANTECUBITAL  Final   Special Requests   Final    BOTTLES DRAWN AEROBIC AND ANAEROBIC Blood Culture adequate volume   Culture   Final    NO GROWTH 4 DAYS Performed at [facility], [address] [place], [place] [address]    Report Status PENDING  Incomplete  Culture, blood (routine x 2)     Status: None (Preliminary result)   Collection Time: 03/31/20  1:55 AM    Specimen: BLOOD  Result Value Ref Range Status   Specimen Description BLOOD RIGHT ANTECUBITAL  Final   Special Requests   Final    BOTTLES DRAWN AEROBIC AND ANAEROBIC Blood Culture adequate volume   Culture   Final    NO GROWTH 4 DAYS Performed at [facility], [address] [place], [place] [address]    Report Status PENDING  Incomplete  MRSA PCR Screening     Status: None   Collection Time: 04/01/20  4:00 AM   Specimen: Nasopharyngeal  Result Value Ref Range Status   MRSA by PCR NEGATIVE NEGATIVE Final    Comment:        The GeneXpert MRSA Assay (FDA approved for NASAL specimens only), is one component of a comprehensive MRSA colonization surveillance program. It is not intended to diagnose  MRSA infection nor to guide or monitor treatment for MRSA infections. Performed at [facility], [address] [place], [place] [address]          Radiology Studies: DG Chest [place] 1 View  Result Date: 04/03/2020 CLINICAL DATA:  Respiratory failure with hypoxia EXAM: PORTABLE CHEST 1 VIEW COMPARISON:  March 31, 2020 chest radiograph and chest CT FINDINGS: There is underlying parenchymal fibrotic type change. There are areas of scattered atelectatic change. There is no frank edema or consolidation. Heart is upper normal in size with pulmonary vascularity normal. No adenopathy. There is aortic atherosclerosis. 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 There is right carotid artery calcification. IMPRESSION: Underlying fibrosis and scattered areas of atelectasis. No edema or airspace opacity. Stable cardiac silhouette. Aortic Atherosclerosis (ICD10-I70.0). There is right carotid artery calcification. Electronically Signed   By: Bretta Bang III M.D.   On: 04/03/2020 12:19        Scheduled Meds: . acetylcysteine  2 mL Nebulization BID  . allopurinol  300 mg Oral Daily  . apixaban  5 mg Oral BID  . aspirin EC  81 mg Oral Daily  . atorvastatin   80 mg Oral Daily  . budesonide (PULMICORT) nebulizer solution  0.25 mg Nebulization BID  . erythromycin  1 application Right Eye TID  . escitalopram  20 mg Oral Daily  . feeding supplement  237 mL Oral BID BM  . folic acid  1 mg Oral Daily  . furosemide  40 mg Intravenous Once  . guaiFENesin  600 mg Oral BID  . hydrochlorothiazide  25 mg Oral Daily  . ipratropium-albuterol  3 mL Nebulization Q6H  . losartan  100 mg Oral Daily  . magnesium oxide  400 mg Oral BID  . mouth rinse  15 mL Mouth Rinse BID  . methylPREDNISolone (SOLU-MEDROL) injection  40 mg Intravenous Q6H   Followed by  . [START ON 04/06/2020] predniSONE  40 mg Oral Q breakfast  . metoprolol succinate  75 mg Oral Daily  . multivitamin with minerals  1 tablet Oral Daily  . pantoprazole  40 mg Oral Daily  . polyvinyl alcohol  1 drop Right Eye Q6H  . prednisoLONE acetate  1 drop Right Eye BID  . thiamine  250 mg Oral Daily   Continuous Infusions: . sodium chloride 250 mL (04/01/20 0520)     LOS: 4 days    Time spent: 35 minutes    Alberteen Sam, MD [facility] 04/04/2020, 4:11 PM     Please page though AMION or Epic secure chat:  For [employer], Higher education careers adviser

## 2020-04-05 DIAGNOSIS — J9621 Acute and chronic respiratory failure with hypoxia: Secondary | ICD-10-CM | POA: Diagnosis not present

## 2020-04-05 LAB — BASIC METABOLIC PANEL
Anion gap: 10 (ref 5–15)
BUN: 36 mg/dL — ABNORMAL HIGH (ref 8–23)
CO2: 41 mmol/L — ABNORMAL HIGH (ref 22–32)
Calcium: 10 mg/dL (ref 8.9–10.3)
Chloride: 89 mmol/L — ABNORMAL LOW (ref 98–111)
Creatinine, Ser: 0.61 mg/dL (ref 0.61–1.24)
GFR, Estimated: >60 mL/min (ref >60)
Glucose, Bld: 139 mg/dL — ABNORMAL HIGH (ref 70–99)
Potassium: 3.9 mmol/L (ref 3.5–5.1)
Sodium: 140 mmol/L (ref 135–145)

## 2020-04-05 LAB — PHOSPHORUS: Phosphorus: 3.9 mg/dL (ref 2.5–4.6)

## 2020-04-05 LAB — CULTURE, BLOOD (ROUTINE X 2)
Culture: NO GROWTH
Culture: NO GROWTH
Special Requests: ADEQUATE
Special Requests: ADEQUATE

## 2020-04-05 LAB — MAGNESIUM: Magnesium: 1.9 mg/dL (ref 1.7–2.4)

## 2020-04-05 MED ORDER — IPRATROPIUM-ALBUTEROL 0.5-2.5 (3) MG/3ML IN SOLN
3.0000 mL | Freq: Three times a day (TID) | RESPIRATORY_TRACT | Status: DC
  Administered 2020-04-05 (×2): 3 mL via RESPIRATORY_TRACT
  Filled 2020-04-05 (×2): qty 3

## 2020-04-05 NOTE — Progress Notes (Signed)
PT Cancellation Note  Patient Details Name: Melvin Campbell MRN: [id number] DOB: [birth year]   Cancelled Treatment:     PT attempt. Currently receiving breathing treatment. Will return at later time/date when pt is available to participate.   Rushie Chestnut 04/05/2020, 2:06 PM

## 2020-04-05 NOTE — Progress Notes (Signed)
[facility] PROGRESS NOTE    Melvin Campbell  RUE:[id number] DOB: [birth year] DOA: 03/31/2020 PCP: Patient, No Pcp Per      Brief Narrative:  Mr. Melvin Campbell is a 80 y.o. M with HTN, PE now with chronic respiratory failure on 2.5L baseline, cognitive impairment/early dementia, alcohol use disorder, and depression who presented with shortness of breath, worse than baseline.  Lives in ALF.    Has some chronic lung disease, has been on O2 since PE in 01/2020.  In the ER, COVID negative.  CTA chest showed no PE, but did show bibasilar atelectasis and pneumonia, advanced emphysema.  Started on antibiotics and admitted.           Assessment & Plan:  Acute on chronic respiratory failure with hypoxia and hypercapnia Completed 5 days antibiotics Day 3 of steroids Oxygen down to 8 L today -Continue steroids, bronchodilators, Mucomyst, MetaNebs, aggressive incentive spirometry -Hold further Lasix     Chronic pulmonary embolism -Continue apixaban  Hypertension Primary prevention of cardiovascular disease BP on the high end of normal given steroids -Continue aspirin, atorvastatin, HCTZ, losartan, metoprolol  Gout No flare -Continue allopurinol  Depression -Continue Escitalopram  Moderate protein calorie malnutrition As evidenced by severe chronic illness, poor p.o. intake -Monitor Phos, mag, and potassium daily -Continue Ensure         Disposition: Status is: Inpatient  Remains inpatient appropriate because: Still requiring 8 L oxygen, only recently weaned down from 13   Dispo:  Patient From: Assisted Living Facility  Planned Disposition: Skilled Nursing Facility  Expected discharge date: 04/06/2020  Medically stable for discharge: No     The patient was admitted with hypoxia, diagnosed with pneumonia, did not respond to treatment.  Since then we have escalated therapy to include steroids, bronchodilators, aggressive alveolar  recruitment.  He is starting to wean down now, once we are able to wean down to 60 L of oxygen will be able to discharge back to his ALF.    Level of care: Progressive Cardiac       MDM: The below labs and imaging reports were reviewed and summarized above.  Medication management as above.     DVT prophylaxis:  apixaban (ELIQUIS) tablet 5 mg  Code Status: FULL Family Communication: Daughter by phone    Consultants:   Pulmonology  Procedures:   2/13 - CTA chest -- old PE, also pneumonia, a lot of atelectasis  Antimicrobials:   Azithromycin and Rocephin 2/12   >>2/16  Culture data:   2/12 blood culture: No growth          Subjective: Patient still somewhat delirious, although is interactive and pleasant.  No new fever, sputum, chest pain, dyspnea.  His leg swelling is resolved.       Objective: Vitals:   04/05/20 0804 04/05/20 1030 04/05/20 1148 04/05/20 1400  BP:  (!) 148/80 (!) 157/88   Pulse: (!) 56 (!) 57 62 (!) 58  Resp: 16  19 17   Temp:   97.8 F (36.6 C)   TempSrc:   Oral   SpO2: 92%  96% 95%  Weight:      Height:        Intake/Output Summary (Last 24 hours) at 04/05/2020 1527 Last data filed at 04/05/2020 0957 Gross per 24 hour  Intake 480 ml  Output 1400 ml  Net -920 ml   Filed Weights   03/31/20 0137 04/05/20 0313  Weight: 84.8 kg 81.3 kg    Examination: General appearance: Chronically ill-appearing  elderly adult male, lying in bed, interactive     HEENT: Scant watery eye discharge, no conjunctival injection Skin: No suspicious rashes or lesions Cardiac: RRR, no murmurs, no lower extremity edema, this is all resolved. Respiratory: Lung sounds diminished, but improved air movement today, some atelectasis crackles on the right, nothing on the left.  No wheezing. Abdomen: Abdomen soft, no tenderness palpation or guarding, no ascites or distention MSK: Severely decreased muscle mass and fat, clubbing Neuro: Awake and alert,  sits up, generalized weakness, but symmetric strength, speech fluent Psych: Oriented to self, attentive to conversation, judgment and insight appear impaired      Data Reviewed: I have personally reviewed following labs and imaging studies:  CBC: Recent Labs  Lab 03/31/20 0332 04/01/20 0556 04/02/20 0633 04/03/20 0358 04/04/20 0952  WBC 8.5 9.5 8.7 8.1 6.2  HGB 13.2 13.8 13.7 13.8 13.8  HCT 45.8 48.0 49.0 48.7 47.2  MCV 87.4 88.1 88.8 88.2 86.0  PLT 156 170 196 159 173   Basic Metabolic Panel: Recent Labs  Lab 04/01/20 0556 04/02/20 0633 04/03/20 0358 04/04/20 0952 04/05/20 0523  NA 139 143 142 140 140  K 4.5 4.3 4.3 4.1 3.9  CL 95* 93* 96* 91* 89*  CO2 37* 39* 36* 40* 41*  GLUCOSE 114* 68* 65* 139* 139*  BUN 41* 34* 21 27* 36*  CREATININE 0.73 0.68 0.59* 0.62 0.61  CALCIUM 9.3 9.5 9.6 9.6 10.0  MG 2.2  --   --   --  1.9  PHOS  --   --   --   --  3.9   GFR: Estimated Creatinine Clearance: 76.5 mL/min (by C-G formula based on SCr of 0.61 mg/dL). Liver Function Tests: Recent Labs  Lab 04/04/20 0952  AST 16  ALT 13  ALKPHOS 57  BILITOT 0.8  PROT 6.2*  ALBUMIN 3.1*   No results for input(s): LIPASE, AMYLASE in the last 168 hours. No results for input(s): AMMONIA in the last 168 hours. Coagulation Profile: Recent Labs  Lab 03/31/20 0332  INR 1.4*   Cardiac Enzymes: No results for input(s): CKTOTAL, CKMB, CKMBINDEX, TROPONINI in the last 168 hours. BNP (last 3 results) No results for input(s): PROBNP in the last 8760 hours. HbA1C: No results for input(s): HGBA1C in the last 72 hours. CBG: No results for input(s): GLUCAP in the last 168 hours. Lipid Profile: No results for input(s): CHOL, HDL, LDLCALC, TRIG, CHOLHDL, LDLDIRECT in the last 72 hours. Thyroid Function Tests: No results for input(s): TSH, T4TOTAL, FREET4, T3FREE, THYROIDAB in the last 72 hours. Anemia Panel: No results for input(s): VITAMINB12, FOLATE, FERRITIN, TIBC, IRON, RETICCTPCT  in the last 72 hours. Urine analysis: No results found for: COLORURINE, APPEARANCEUR, LABSPEC, PHURINE, GLUCOSEU, HGBUR, BILIRUBINUR, KETONESUR, PROTEINUR, UROBILINOGEN, NITRITE, LEUKOCYTESUR Sepsis Labs: @LABRCNTIP (procalcitonin:4,lacticacidven:4)  ) Recent Results (from the past 240 hour(s))  Resp Panel by RT-PCR (Flu A&B, Covid) Nasopharyngeal Swab     Status: None   Collection Time: 03/31/20  1:28 AM   Specimen: Nasopharyngeal Swab; Nasopharyngeal(NP) swabs in vial transport medium  Result Value Ref Range Status   SARS Coronavirus 2 by RT PCR NEGATIVE NEGATIVE Final    Comment: (NOTE) SARS-CoV-2 target nucleic acids are NOT DETECTED.  The SARS-CoV-2 RNA is generally detectable in upper respiratory specimens during the acute phase of infection. The lowest concentration of SARS-CoV-2 viral copies this assay can detect is 138 copies/mL. A negative result does not preclude SARS-Cov-2 infection and should not be used as the sole basis  for treatment or other patient management decisions. A negative result may occur with  improper specimen collection/handling, submission of specimen other than nasopharyngeal swab, presence of viral mutation(s) within the areas targeted by this assay, and inadequate number of viral copies(<138 copies/mL). A negative result must be combined with clinical observations, patient history, and epidemiological information. The expected result is Negative.  Fact Sheet for Patients:  BloggerCourse.com  Fact Sheet for Healthcare Providers:  SeriousBroker.it  This test is no t yet approved or cleared by the [place] FDA and  has been authorized for detection and/or diagnosis of SARS-CoV-2 by FDA under an Emergency Use Authorization (EUA). This EUA will remain  in effect (meaning this test can be used) for the duration of the COVID-19 declaration under Section 564(b)(1) of the Act, 21 U.S.C.section  360bbb-3(b)(1), unless the authorization is terminated  or revoked sooner.       Influenza A by PCR NEGATIVE NEGATIVE Final   Influenza B by PCR NEGATIVE NEGATIVE Final    Comment: (NOTE) The Xpert Xpress SARS-CoV-2/FLU/RSV plus assay is intended as an aid in the diagnosis of influenza from Nasopharyngeal swab specimens and should not be used as a sole basis for treatment. Nasal washings and aspirates are unacceptable for Xpert Xpress SARS-CoV-2/FLU/RSV testing.  Fact Sheet for Patients: BloggerCourse.com  Fact Sheet for Healthcare Providers: SeriousBroker.it  This test is not yet approved or cleared by the [place] FDA and has been authorized for detection and/or diagnosis of SARS-CoV-2 by FDA under an Emergency Use Authorization (EUA). This EUA will remain in effect (meaning this test can be used) for the duration of the COVID-19 declaration under Section 564(b)(1) of the Act, 21 U.S.C. section 360bbb-3(b)(1), unless the authorization is terminated or revoked.  Performed at [facility], [address] [place], [place] [address]   Culture, blood (routine x 2)     Status: None   Collection Time: 03/31/20  1:54 AM   Specimen: BLOOD  Result Value Ref Range Status   Specimen Description BLOOD RIGHT ANTECUBITAL  Final   Special Requests   Final    BOTTLES DRAWN AEROBIC AND ANAEROBIC Blood Culture adequate volume   Culture   Final    NO GROWTH 5 DAYS Performed at [facility], [address] [place], [place] [address]    Report Status 04/05/2020 FINAL  Final  Culture, blood (routine x 2)     Status: None   Collection Time: 03/31/20  1:55 AM   Specimen: BLOOD  Result Value Ref Range Status   Specimen Description BLOOD RIGHT ANTECUBITAL  Final   Special Requests   Final    BOTTLES DRAWN AEROBIC AND ANAEROBIC Blood Culture adequate volume   Culture   Final    NO GROWTH 5 DAYS Performed at [facility], [address] [place], [place] [address]    Report Status 04/05/2020 FINAL  Final  MRSA PCR Screening     Status: None   Collection Time: 04/01/20  4:00 AM   Specimen: Nasopharyngeal  Result Value Ref Range Status   MRSA by PCR NEGATIVE NEGATIVE Final    Comment:        The GeneXpert MRSA Assay (FDA approved for NASAL specimens only), is one component of a comprehensive MRSA colonization surveillance program. It is not intended to diagnose MRSA infection nor to guide or monitor treatment for MRSA infections. Performed at [facility], [address], [place], [place] [address]  Radiology Studies: No results found.      Scheduled Meds: . acetylcysteine  2 mL Nebulization BID  . allopurinol  300 mg Oral Daily  . apixaban  5 mg Oral BID  . aspirin EC  81 mg Oral Daily  . atorvastatin  80 mg Oral Daily  . budesonide (PULMICORT) nebulizer solution  0.25 mg Nebulization BID  . erythromycin  1 application Right Eye TID  . escitalopram  20 mg Oral Daily  . feeding supplement  237 mL Oral BID BM  . folic acid  1 mg Oral Daily  . guaiFENesin  600 mg Oral BID  . hydrochlorothiazide  25 mg Oral Daily  . ipratropium-albuterol  3 mL Nebulization TID  . losartan  100 mg Oral Daily  . magnesium oxide  400 mg Oral BID  . mouth rinse  15 mL Mouth Rinse BID  . metoprolol succinate  75 mg Oral Daily  . multivitamin with minerals  1 tablet Oral Daily  . pantoprazole  40 mg Oral Daily  . polyvinyl alcohol  1 drop Right Eye Q6H  . prednisoLONE acetate  1 drop Right Eye BID  . [START ON 04/06/2020] predniSONE  40 mg Oral Q breakfast  . thiamine  250 mg Oral Daily   Continuous Infusions: . sodium chloride 250 mL (04/01/20 0520)     LOS: 5 days    Time spent: 25 minutes    Alberteen Sam, MD [facility] 04/05/2020, 3:27 PM     Please page though AMION or Epic secure chat:  For [employer], Higher education careers adviser

## 2020-04-05 NOTE — Care Management Important Message (Signed)
Important Message  Patient Details  Name: Melvin Campbell MRN: [id number] Date of Birth: 07-Jun-1940   Medicare Important Message Given:  Yes     Johnell Comings 04/05/2020, 11:55 AM

## 2020-04-05 NOTE — Progress Notes (Signed)
Patient O2 weaned to 7L HFNC. Patient O2 maintained at 94-96 percent.

## 2020-04-05 NOTE — TOC Progression Note (Signed)
Transition of Care [facility]) - Progression Note    Patient Details  Name: Irvan Tiedt MRN: [id number] Date of Birth: 12/21/1940  [facility]) CM/SW Contact  Hetty Ely, RN Phone Number: 04/05/2020, 4:10 PM  Clinical Narrative: [name] to get information about the maximum amount of Oxygen patient can be on for admission back to the facility, left two messages no return call. Attending notified, will have Saturday [facility] staff to follow up.           Expected Discharge Plan and Services                                                 Social Determinants of Health (SDOH) Interventions    Readmission Risk Interventions No flowsheet data found.

## 2020-04-06 DIAGNOSIS — J9621 Acute and chronic respiratory failure with hypoxia: Secondary | ICD-10-CM | POA: Diagnosis not present

## 2020-04-06 LAB — BASIC METABOLIC PANEL
Anion gap: 11 (ref 5–15)
BUN: 32 mg/dL — ABNORMAL HIGH (ref 8–23)
CO2: 41 mmol/L — ABNORMAL HIGH (ref 22–32)
Calcium: 10.1 mg/dL (ref 8.9–10.3)
Chloride: 87 mmol/L — ABNORMAL LOW (ref 98–111)
Creatinine, Ser: 0.69 mg/dL (ref 0.61–1.24)
GFR, Estimated: >60 mL/min (ref >60)
Glucose, Bld: 93 mg/dL (ref 70–99)
Potassium: 3.9 mmol/L (ref 3.5–5.1)
Sodium: 139 mmol/L (ref 135–145)

## 2020-04-06 LAB — PHOSPHORUS: Phosphorus: 3.4 mg/dL (ref 2.5–4.6)

## 2020-04-06 LAB — MAGNESIUM: Magnesium: 1.9 mg/dL (ref 1.7–2.4)

## 2020-04-06 MED ORDER — IPRATROPIUM-ALBUTEROL 0.5-2.5 (3) MG/3ML IN SOLN
3.0000 mL | Freq: Two times a day (BID) | RESPIRATORY_TRACT | Status: DC
  Administered 2020-04-06 – 2020-04-12 (×13): 3 mL via RESPIRATORY_TRACT
  Filled 2020-04-06 (×14): qty 3

## 2020-04-06 MED ORDER — FUROSEMIDE 10 MG/ML IJ SOLN
40.0000 mg | Freq: Once | INTRAMUSCULAR | Status: AC
  Administered 2020-04-06: 40 mg via INTRAVENOUS
  Filled 2020-04-06: qty 4

## 2020-04-06 NOTE — Progress Notes (Signed)
[facility] PROGRESS NOTE    Melvin Campbell  LTJ:[id number] DOB: Jan 07, 1941 DOA: 03/31/2020 PCP: Patient, No Pcp Per      Brief Narrative:  Melvin Campbell is a 80 y.o. M with HTN, PE now with chronic respiratory failure on 2.5L baseline, cognitive impairment/early dementia, alcohol use disorder, and depression who presented with shortness of breath, worse than baseline.  Lives in ALF.    Has some chronic lung disease, has been on O2 since PE in 01/2020.  In the ER, COVID negative.  CTA chest showed no PE, but did show bibasilar atelectasis and pneumonia, advanced emphysema.  Started on antibiotics and admitted.           Assessment & Plan:  Acute on chronic respiratory failure with hypoxia and hypercapnia Completed 5 days antibiotics Day 4 steroids Oxygen stable at 8 to 10 L today -Continue steroids, scheduled bronchodilators -Continue Mucomyst, MetaNebs -Continue incentive spirometry, flutter valve -Repeat Lasix    Chronic pulmonary embolism -Continue apixaban  Hypertension Primary prevention of cardiovascular disease BP high normal -Continue home aspirin, atorvastatin, HCTZ, losartan, metoprolol   Bradycardia Asymptomatic, while sleeping.  Heart rates mostly in the 50s and 60s, stable on home dose of metoprolol  Gout No flare -Continue allopurinol  Depression -Continue Escitalopram  Moderate protein calorie malnutrition As evidenced by severe chronic illness, poor p.o. intake -Monitor Phos, mag, and potassium daily -Continue Ensure         Disposition: Status is: Inpatient  Remains inpatient appropriate because: Still requiring 8 L oxygen, only recently weaned down from 13   Dispo:  Patient From: Assisted Living Facility  Planned Disposition: Skilled Nursing Facility  Expected discharge date: 04/06/2020  Medically stable for discharge: No     The patient was admitted with hypoxia, diagnosed with pneumonia, did not  respond to treatment.  Since then we have escalated therapy to include steroids, bronchodilators, aggressive alveolar recruitment.  We are adding IV Lasix, hopefully will get down to a manageable oxygen level this weekend and discharge back to ALF    Level of care: Progressive Cardiac       MDM: The below labs and imaging reports were reviewed and summarized above.  Medication management as above.     DVT prophylaxis:  apixaban (ELIQUIS) tablet 5 mg  Code Status: FULL Family Communication: Daughter by phone    Consultants:   Pulmonology  Procedures:   2/13 - CTA chest -- old PE, also pneumonia, a lot of atelectasis  Antimicrobials:   Azithromycin and Rocephin 2/12   >>2/16  Culture data:   2/12 blood culture: No growth          Subjective: No new fever, cough, sputum, chest pain, leg swelling, dyspnea, respiratory distress.       Objective: Vitals:   04/06/20 0500 04/06/20 0531 04/06/20 0756 04/06/20 1229  BP: (!) 195/89 (!) 145/79 (!) 146/74 140/76  Pulse: 61 (!) 51 70 80  Resp: 18  18 18   Temp: 98.2 F (36.8 C)  98.4 F (36.9 C) 98.1 F (36.7 C)  TempSrc: Axillary  Axillary Oral  SpO2: 92%  95% 92%  Weight:      Height:        Intake/Output Summary (Last 24 hours) at 04/06/2020 1640 Last data filed at 04/06/2020 1205 Gross per 24 hour  Intake 480 ml  Output 1025 ml  Net -545 ml   Filed Weights   03/31/20 0137 04/05/20 0313 04/06/20 0202  Weight: 84.8 kg 81.3 kg  81.6 kg    Examination: General appearance: Chronically ill-appearing elderly adult male, lying in bed, interactive     HEENT:    Skin:  Cardiac: Slow, regular, no murmurs no lower extremity edema Respiratory: Lung sounds diminished, but no rales or wheezing Abdomen: Tenderness palpation or guarding MSK:  Neuro: Awake and alert, answers questions, but is mostly confused, has generalized weakness, symmetric strength, speech fluent Psych: Oriented to self only,  attentive to conversation, then falls back asleep        Data Reviewed: I have personally reviewed following labs and imaging studies:  CBC: Recent Labs  Lab 03/31/20 0332 04/01/20 0556 04/02/20 0633 04/03/20 0358 04/04/20 0952  WBC 8.5 9.5 8.7 8.1 6.2  HGB 13.2 13.8 13.7 13.8 13.8  HCT 45.8 48.0 49.0 48.7 47.2  MCV 87.4 88.1 88.8 88.2 86.0  PLT 156 170 196 159 173   Basic Metabolic Panel: Recent Labs  Lab 04/01/20 0556 04/02/20 0633 04/03/20 0358 04/04/20 0952 04/05/20 0523 04/06/20 0554  NA 139 143 142 140 140 139  K 4.5 4.3 4.3 4.1 3.9 3.9  CL 95* 93* 96* 91* 89* 87*  CO2 37* 39* 36* 40* 41* 41*  GLUCOSE 114* 68* 65* 139* 139* 93  BUN 41* 34* 21 27* 36* 32*  CREATININE 0.73 0.68 0.59* 0.62 0.61 0.69  CALCIUM 9.3 9.5 9.6 9.6 10.0 10.1  MG 2.2  --   --   --  1.9 1.9  PHOS  --   --   --   --  3.9 3.4   GFR: Estimated Creatinine Clearance: 76.6 mL/min (by C-G formula based on SCr of 0.69 mg/dL). Liver Function Tests: Recent Labs  Lab 04/04/20 0952  AST 16  ALT 13  ALKPHOS 57  BILITOT 0.8  PROT 6.2*  ALBUMIN 3.1*   No results for input(s): LIPASE, AMYLASE in the last 168 hours. No results for input(s): AMMONIA in the last 168 hours. Coagulation Profile: Recent Labs  Lab 03/31/20 0332  INR 1.4*   Cardiac Enzymes: No results for input(s): CKTOTAL, CKMB, CKMBINDEX, TROPONINI in the last 168 hours. BNP (last 3 results) No results for input(s): PROBNP in the last 8760 hours. HbA1C: No results for input(s): HGBA1C in the last 72 hours. CBG: No results for input(s): GLUCAP in the last 168 hours. Lipid Profile: No results for input(s): CHOL, HDL, LDLCALC, TRIG, CHOLHDL, LDLDIRECT in the last 72 hours. Thyroid Function Tests: No results for input(s): TSH, T4TOTAL, FREET4, T3FREE, THYROIDAB in the last 72 hours. Anemia Panel: No results for input(s): VITAMINB12, FOLATE, FERRITIN, TIBC, IRON, RETICCTPCT in the last 72 hours. Urine analysis: No results  found for: COLORURINE, APPEARANCEUR, LABSPEC, PHURINE, GLUCOSEU, HGBUR, BILIRUBINUR, KETONESUR, PROTEINUR, UROBILINOGEN, NITRITE, LEUKOCYTESUR Sepsis Labs: @LABRCNTIP (procalcitonin:4,lacticacidven:4)  ) Recent Results (from the past 240 hour(s))  Resp Panel by RT-PCR (Flu A&B, Covid) Nasopharyngeal Swab     Status: None   Collection Time: 03/31/20  1:28 AM   Specimen: Nasopharyngeal Swab; Nasopharyngeal(NP) swabs in vial transport medium  Result Value Ref Range Status   SARS Coronavirus 2 by RT PCR NEGATIVE NEGATIVE Final    Comment: (NOTE) SARS-CoV-2 target nucleic acids are NOT DETECTED.  The SARS-CoV-2 RNA is generally detectable in upper respiratory specimens during the acute phase of infection. The lowest concentration of SARS-CoV-2 viral copies this assay can detect is 138 copies/mL. A negative result does not preclude SARS-Cov-2 infection and should not be used as the sole basis for treatment or other patient management decisions. A negative  result may occur with  improper specimen collection/handling, submission of specimen other than nasopharyngeal swab, presence of viral mutation(s) within the areas targeted by this assay, and inadequate number of viral copies(<138 copies/mL). A negative result must be combined with clinical observations, patient history, and epidemiological information. The expected result is Negative.  Fact Sheet for Patients:  BloggerCourse.com  Fact Sheet for Healthcare Providers:  SeriousBroker.it  This test is no t yet approved or cleared by the [place] FDA and  has been authorized for detection and/or diagnosis of SARS-CoV-2 by FDA under an Emergency Use Authorization (EUA). This EUA will remain  in effect (meaning this test can be used) for the duration of the COVID-19 declaration under Section 564(b)(1) of the Act, 21 U.S.C.section 360bbb-3(b)(1), unless the authorization is terminated   or revoked sooner.       Influenza A by PCR NEGATIVE NEGATIVE Final   Influenza B by PCR NEGATIVE NEGATIVE Final    Comment: (NOTE) The Xpert Xpress SARS-CoV-2/FLU/RSV plus assay is intended as an aid in the diagnosis of influenza from Nasopharyngeal swab specimens and should not be used as a sole basis for treatment. Nasal washings and aspirates are unacceptable for Xpert Xpress SARS-CoV-2/FLU/RSV testing.  Fact Sheet for Patients: BloggerCourse.com  Fact Sheet for Healthcare Providers: SeriousBroker.it  This test is not yet approved or cleared by the [place] FDA and has been authorized for detection and/or diagnosis of SARS-CoV-2 by FDA under an Emergency Use Authorization (EUA). This EUA will remain in effect (meaning this test can be used) for the duration of the COVID-19 declaration under Section 564(b)(1) of the Act, 21 U.S.C. section 360bbb-3(b)(1), unless the authorization is terminated or revoked.  Performed at [facility], [address] [place], [place] [address]   Culture, blood (routine x 2)     Status: None   Collection Time: 03/31/20  1:54 AM   Specimen: BLOOD  Result Value Ref Range Status   Specimen Description BLOOD RIGHT ANTECUBITAL  Final   Special Requests   Final    BOTTLES DRAWN AEROBIC AND ANAEROBIC Blood Culture adequate volume   Culture   Final    NO GROWTH 5 DAYS Performed at [facility], [address] [place], [place] [address]    Report Status 04/05/2020 FINAL  Final  Culture, blood (routine x 2)     Status: None   Collection Time: 03/31/20  1:55 AM   Specimen: BLOOD  Result Value Ref Range Status   Specimen Description BLOOD RIGHT ANTECUBITAL  Final   Special Requests   Final    BOTTLES DRAWN AEROBIC AND ANAEROBIC Blood Culture adequate volume   Culture   Final    NO GROWTH 5 DAYS Performed at [facility], [address] [place], [place]  [address]    Report Status 04/05/2020 FINAL  Final  MRSA PCR Screening     Status: None   Collection Time: 04/01/20  4:00 AM   Specimen: Nasopharyngeal  Result Value Ref Range Status   MRSA by PCR NEGATIVE NEGATIVE Final    Comment:        The GeneXpert MRSA Assay (FDA approved for NASAL specimens only), is one component of a comprehensive MRSA colonization surveillance program. It is not intended to diagnose MRSA infection nor to guide or monitor treatment for MRSA infections. Performed at [facility], [address] [place], [place] [address]          Radiology Studies: No results found.  Scheduled Meds: . acetylcysteine  2 mL Nebulization BID  . allopurinol  300 mg Oral Daily  . apixaban  5 mg Oral BID  . aspirin EC  81 mg Oral Daily  . atorvastatin  80 mg Oral Daily  . budesonide (PULMICORT) nebulizer solution  0.25 mg Nebulization BID  . erythromycin  1 application Right Eye TID  . escitalopram  20 mg Oral Daily  . feeding supplement  237 mL Oral BID BM  . folic acid  1 mg Oral Daily  . guaiFENesin  600 mg Oral BID  . hydrochlorothiazide  25 mg Oral Daily  . ipratropium-albuterol  3 mL Nebulization BID  . losartan  100 mg Oral Daily  . magnesium oxide  400 mg Oral BID  . mouth rinse  15 mL Mouth Rinse BID  . metoprolol succinate  75 mg Oral Daily  . multivitamin with minerals  1 tablet Oral Daily  . pantoprazole  40 mg Oral Daily  . polyvinyl alcohol  1 drop Right Eye Q6H  . prednisoLONE acetate  1 drop Right Eye BID  . predniSONE  40 mg Oral Q breakfast  . thiamine  250 mg Oral Daily   Continuous Infusions: . sodium chloride 250 mL (04/01/20 0520)     LOS: 6 days    Time spent: 25 minutes    Alberteen Samhristopher P Clinten Howk, MD [facility] 04/06/2020, 4:40 PM     Please page though AMION or Epic secure chat:  For [employer] password, Higher education careers advisercontact charge nurse

## 2020-04-06 NOTE — Progress Notes (Signed)
Physical Therapy Treatment Patient Details Name: Melvin Campbell MRN: [id number] DOB: 05-20-1940 Today's Date: 04/06/2020    History of Present Illness Pt admitted for acute/chronic resp failure with hypoxia now additionally diagnosed with NSTEMI. Pt with complaints of SOB symptoms. Per chart, on 2.5 L of O2 chronic (night).    PT Comments    Pt seen for PT treatment with pt able to get OOB to recliner with min assist with use of RW for transfers. Pt performs standing & seated exercises as noted below with instructional cuing for technique. Gait limited by HFNC (unable to extend tubing) but anticipate pt Melvin be able to ambulate short distances with AD. Melvin continue to follow pt acutely to progress gait as able & address endurance, activity tolerance & balance.     Follow Up Recommendations  Home health PT     Equipment Recommendations  Rolling walker with 5" wheels    Recommendations for Other Services       Precautions / Restrictions Precautions Precautions: Fall Restrictions Weight Bearing Restrictions: No    Mobility  Bed Mobility Overal bed mobility: Needs Assistance Bed Mobility: Supine to Sit     Supine to sit: Supervision;HOB elevated     General bed mobility comments: use of bed rails    Transfers Overall transfer level: Needs assistance Equipment used: Rolling walker (2 wheeled) Transfers: Sit to/from [employer] Sit to Stand: Min guard Stand pivot transfers: Min assist (2/2 posterior lean/LOB & assist to safely sit in recliner)       General transfer comment: cuing for hand placement  Ambulation/Gait                 Stairs             Wheelchair Mobility    Modified Rankin (Stroke Patients Only)       Balance Overall balance assessment: Needs assistance Sitting-balance support: No upper extremity supported;Feet supported Sitting balance-Leahy Scale: Good     Standing balance support: Bilateral upper  extremity supported Standing balance-Leahy Scale: Good Standing balance comment: UE support on RW during standing exercises/transfers                            Cognition Arousal/Alertness: Awake/alert Behavior During Therapy: WFL for tasks assessed/performed Overall Cognitive Status: Within Functional Limits for tasks assessed                                        Exercises General Exercises - Lower Extremity Hip ABduction/ADduction: AROM;Strengthening;Both;10 reps;Seated (hip adduction pillow squeezes) Hip Flexion/Marching: AROM;Strengthening;Right;Left;10 reps;Standing (2 sets, standing with BUE support on RW & CGA/close supervision for balance) Mini-Sqauts: Strengthening;10 reps;Standing (BUE support on RW) Other Exercises Other Exercises: 5x STS with BUE & CGA, 5x STS without BUE support & min assist with cuing to not push back on recliner with posterior BLE    General Comments General comments (skin integrity, edema, etc.): Pt on 10L HFNC throughout session, lowest SpO2 91%, HR 43 bpm at rest increasing to 67 bpm with activity. Pt utilized urinal for continent void while sitting without assistance; pt noted to have blood dripping from unknown source - nurse made aware & in room to examine pt.      Pertinent Vitals/Pain Pain Assessment: No/denies pain    Home Living  Prior Function            PT Goals (current goals can now be found in the care plan section) Acute Rehab PT Goals Patient Stated Goal: to get out of bed PT Goal Formulation: With patient Time For Goal Achievement: 04/17/20 Potential to Achieve Goals: Good Additional Goals Additional Goal #1: Pt Melvin be able to perform bed mobility/transfers with mod I and safe technique in order to improve functional independence Progress towards PT goals: Progressing toward goals    Frequency    Min 2X/week      PT Plan Current plan remains appropriate     Co-evaluation              AM-PAC PT "6 Clicks" Mobility   Outcome Measure  Help needed turning from your back to your side while in a flat bed without using bedrails?: None Help needed moving from lying on your back to sitting on the side of a flat bed without using bedrails?: A Little Help needed moving to and from a bed to a chair (including a wheelchair)?: A Little Help needed standing up from a chair using your arms (e.g., wheelchair or bedside chair)?: A Little Help needed to walk in hospital room?: A Little Help needed climbing 3-5 steps with a railing? : A Lot 6 Click Score: 18    End of Session Equipment Utilized During Treatment: Gait belt;Oxygen Activity Tolerance: Patient tolerated treatment well Patient left: in chair;with chair alarm set;with call bell/phone within reach   PT Visit Diagnosis: Unsteadiness on feet (R26.81);Muscle weakness (generalized) (M62.81);Difficulty in walking, not elsewhere classified (R26.2)     Time: 7373-6681 PT Time Calculation (min) (ACUTE ONLY): 24 min  Charges:  $Therapeutic Exercise: 8-22 mins $Therapeutic Activity: 8-22 mins                     Aleda Grana, PT, DPT 04/06/20, 4:30 PM    Sandi Mariscal 04/06/2020, 4:29 PM

## 2020-04-06 NOTE — Plan of Care (Signed)

## 2020-04-06 NOTE — Progress Notes (Signed)
Made Dr. Maryfrances Bunnell aware patients heart rate is between 40-50 currently on po metoprolol 75mg . No new orders, just continue to monitor per md

## 2020-04-07 DIAGNOSIS — J9621 Acute and chronic respiratory failure with hypoxia: Secondary | ICD-10-CM | POA: Diagnosis not present

## 2020-04-07 LAB — BASIC METABOLIC PANEL
Anion gap: 11 (ref 5–15)
BUN: 30 mg/dL — ABNORMAL HIGH (ref 8–23)
CO2: 40 mmol/L — ABNORMAL HIGH (ref 22–32)
Calcium: 10 mg/dL (ref 8.9–10.3)
Chloride: 86 mmol/L — ABNORMAL LOW (ref 98–111)
Creatinine, Ser: 0.52 mg/dL — ABNORMAL LOW (ref 0.61–1.24)
GFR, Estimated: >60 mL/min (ref >60)
Glucose, Bld: 96 mg/dL (ref 70–99)
Potassium: 4 mmol/L (ref 3.5–5.1)
Sodium: 137 mmol/L (ref 135–145)

## 2020-04-07 LAB — MAGNESIUM: Magnesium: 1.8 mg/dL (ref 1.7–2.4)

## 2020-04-07 LAB — PHOSPHORUS: Phosphorus: 3.4 mg/dL (ref 2.5–4.6)

## 2020-04-07 MED ORDER — INFLUENZA VAC A&B SA ADJ QUAD 0.5 ML IM PRSY
0.5000 mL | PREFILLED_SYRINGE | INTRAMUSCULAR | Status: DC
  Filled 2020-04-07: qty 0.5

## 2020-04-07 MED ORDER — FUROSEMIDE 10 MG/ML IJ SOLN
40.0000 mg | Freq: Once | INTRAMUSCULAR | Status: AC
  Administered 2020-04-07: 40 mg via INTRAVENOUS
  Filled 2020-04-07: qty 4

## 2020-04-07 NOTE — TOC Progression Note (Signed)
Transition of Care [facility]) - Progression Note    Patient Details  Name: Melvin Campbell MRN: [id number] Date of Birth: 1940/11/10  Transition of Care [facility]) CM/SW Contact  Ashley Royalty Lutricia Feil, RN Phone Number: 04/07/2020, 2:29 PM  Clinical Narrative:     [facility] RN spoke with [place] at [facility] [phone] who was not able to provide the requested information on max O2 for the facility however informed Sherwood Gambler at [phone] ext [phone] only available during the week and would be available tomorrow (Monday) to provide this information.   TOC team will continue to follow up accordingly on pt's discharge needs.336       Expected Discharge Plan and Services                                                 Social Determinants of Health (SDOH) Interventions    Readmission Risk Interventions No flowsheet data found.

## 2020-04-07 NOTE — Progress Notes (Addendum)
Patient found sitting in bed by [facility], charge RN.  Patient was bleeding from left forearm and stated he tripped on the way back from the bathroom. Skin tear was cleansed and covered in foam dressing.  He denied hitting his head.  Pupils equal and round, react to light.  Mental status at baseline from initial encounter upon arrival from 2A. Full range of motion. Denies pain.  MD, family aware.  Telesitter ordered, low bed ordered, patient educated on rationale, necessity of these items; agrees and verbalizes understanding.    04/07/20 1840  What Happened  Was fall witnessed? No  Was patient injured? Yes  Patient found other (Comment) (sitting in bed)  Found by Staff-comment [facility])  Stated prior activity bathroom-unassisted  Follow Up  MD notified y  Time MD notified 1  Family notified Yes - comment Kingstyn Deruiter)  Time family notified [phone]  Additional tests No  Simple treatment Dressing  Progress note created (see row info) Yes  Adult Fall Risk Assessment  Risk Factor Category (scoring not indicated) High fall risk per protocol (document High fall risk)  Patient Fall Risk Level High fall risk  Adult Fall Risk Interventions  Required Bundle Interventions *See Row Information* High fall risk - low, moderate, and high requirements implemented  Additional Interventions Use of appropriate toileting equipment (bedpan, BSC, etc.)  Screening for Fall Injury Risk (To be completed on HIGH fall risk patients) - Assessing Need for Floor Mats  Risk For Fall Injury- Criteria for Floor Mats Previous fall this admission  Will Implement Floor Mats Yes  Pain Assessment  Pain Scale 0-10  Pain Score 0  Neurological  Neuro (WDL) X  Level of Consciousness Alert  Orientation Level Oriented to person;Oriented to place;Oriented to time;Disoriented to situation  Cognition Poor safety awareness;Poor judgement;Memory impairment  Speech Clear  Pupil Assessment  No  Neuro Symptoms  Forgetful  Glasgow Coma Scale  Eye Opening 4  Best Verbal Response (NON-intubated) 5  Best Motor Response 6  Glasgow Coma Scale Score 15  Musculoskeletal  Musculoskeletal (WDL) X  Assistive Device Front wheel walker  Generalized Weakness Yes  Weight Bearing Restrictions No  Integumentary  Integumentary (WDL) X  Abrasion Location Arm  Abrasion Location Orientation Left  Abrasion Intervention Cleansed;Foam

## 2020-04-07 NOTE — Progress Notes (Signed)
Mobility Specialist - Progress Note   04/07/20 1200  Mobility  Activity Ambulated in room  Level of Assistance Minimal assist, patient does 75% or more  Assistive Device Front wheel walker  Distance Ambulated (ft) 32 ft  Mobility Response Tolerated well  Mobility performed by Mobility specialist  $Mobility charge 1 Mobility    Pre-mobility: 51 HR, 90% SpO2 During mobility: 64 HR, 85% SpO2 Post-mobility: 54 HR, 92% SpO2   Pt was lying in bed upon arrival utilizing 5L [place] O2. Pt agreed to session. Pt denied pain, nausea, and fatigue and reported that he "feels really good today." Pt does c/o mild SOB upon arrival, O2 sat at 90%. PLB technique was utilized prior to activity without notable change. Mobility increased O2 to 6L prior to OOB activity, in which saturations sat up to 93%. Pt was able to get EOB with modA for LE support. Upon sitting, pt denied dizziness. Pt stood to RW with minA and ambulated 8' to sit in chair placed at the end of the bed. Pt sat in chair with minA and began to place lunch order via phone to dining services. Noted pt's O2 briefly desat to 79% but rebounds to mid 80s. Pt stated that his O2 always drops when he starts talking. Mobility increased O2 to 7L to get O2 sats >/= 90%. Pt able to complete an additional 57' x2 with sats maintaining in the low 90s. Overall, pt tolerated session well. Pt returned supine with supervision and was left in bed with all needs in reach, alarm set. Pt returned to 5L [place] O2 at exit. Nurse notified.    Melvin Campbell Mobility Specialist 04/07/20, 12:42 PM

## 2020-04-07 NOTE — Progress Notes (Signed)
[facility] PROGRESS NOTE    Melvin Campbell  GUY:[id number] DOB: 1940/06/13 DOA: 03/31/2020 PCP: Patient, No Pcp Per      Brief Narrative:  Melvin Campbell is a 80 y.o. M with HTN, PE now with chronic respiratory failure on 2.5L baseline, cognitive impairment/early dementia, alcohol use disorder, and depression who presented with shortness of breath, worse than baseline.  Lives in ALF.    Has some chronic lung disease, has been on O2 since PE in Oct 2021.  In the ER, COVID negative.  CTA chest showed no PE, but did show bibasilar atelectasis and pneumonia, advanced emphysema.  Started on antibiotics and admitted.           Assessment & Plan:  Acute on chronic respiratory failure with hypoxia and hypercapnia Patient appears to have had a long history of unrecognized respiratory disease.    This was unrecognized until a recent hospitalization for corneal ulcer, which was prolonged by hypoxia, which resolved with diuresis.  Since then, he has been on 2-3L O2 at his ALF.  Here, he was initially admitted with hypoxia, based on CTA chest, thought to be from aspiration pneumonia.  Completed 5 days antibiotics without improvement in O2.  Started on steroids, now completed 5 days, O2 improved considerably in the last 2 days with steroids and again Lasix.  Weaned to 5L yesterday, still desaturating with ambulation here.  -Complete steroids -Continue Pulmicort -Continue scheduled bronchodilators for now, taper to PRN at discharge  -Continue Metanebs, incentive spirometry and aggressive Chest PT  -Lasix once -Repeat BMP tomorrow   -If SpO2 stable at <=5lpm tomorrow, will plan for d/c back to ALF    History pulmonary embolism -Continue apixaban  Hypertension Primary prevention of cardiovascular disease BP elevated -Continue home aspirin, atorvastatin, HCTZ, losartan, metoprolol    Bradycardia Asymptomatic, while sleeping.  Heart rates mostly in the  50s and 60s, stable on home dose of metoprolol  Gout No symptoms -Continue allopurinol  Depression -Continue escitalopram  Dementia Patient has some memory impairment, this appears to be worsened by hypoxia.  Moderate protein calorie malnutrition As evidenced by severe chronic illness, poor p.o. intake Phos, mag stable   -Continue Ensure         Disposition: Status is: Inpatient  Remains inpatient appropriate because: Still weaning high levels of oxygen, down to 5 L today   Dispo:  Patient From: Assisted Living Facility  Planned Disposition: Skilled Nursing Facility  Expected discharge date: 04/06/2020  Medically stable for discharge: No     The patient was admitted with hypoxia, diagnosed with pneumonia, did not respond to treatment.  Since then we have escalated therapy to include steroids, bronchodilators, aggressive alveolar recruitment.  We will give 1 additional day Lasix, if able to maintain 5 L of oxygen per minute or wean down to less oxygen requirement, will discharge home to ALF tomorrow.    Level of care: Med-Surg       MDM: The below labs and imaging reports were reviewed and summarized above.  Medication management as above.     DVT prophylaxis:  apixaban (ELIQUIS) tablet 5 mg  Code Status: FULL Family Communication: Daughter by phone    Consultants:   Pulmonology  Procedures:   2/13 - CTA chest -- old PE, also pneumonia, a lot of atelectasis  Antimicrobials:   Azithromycin and Rocephin 2/12   >>2/16  Culture data:   2/12 blood culture: No growth          Subjective:  The patient remains interactive and pleasantly confused, but no new cough, sputum, chest pain, dyspnea.  His leg swelling is resolved.       Objective: Vitals:   04/07/20 0348 04/07/20 0802 04/07/20 0822 04/07/20 1137  BP: (!) 150/67 (!) 168/81  (!) 144/76  Pulse: (!) 58 (!) 50  (!) 52  Resp: 16 19  18   Temp: 97.6 F (36.4 C) 97.9 F (36.6 C)   97.7 F (36.5 C)  TempSrc: Oral     SpO2: 94% 95% 93% 92%  Weight: 82 kg     Height:        Intake/Output Summary (Last 24 hours) at 04/07/2020 1459 Last data filed at 04/07/2020 1433 Gross per 24 hour  Intake 240 ml  Output 2800 ml  Net -2560 ml   Filed Weights   04/05/20 0313 04/06/20 0202 04/07/20 0348  Weight: 81.3 kg 81.6 kg 82 kg    Examination: General appearance: Chronically ill-appearing elderly adult male, lying in bed, sleeping, arouses, somewhat confused.  No change from previous.     HEENT: Anicteric, conjunctival pink, chronic right eye deformity, no nasal deformity, discharge, or epistaxis. Skin:  Cardiac: RRR, no murmurs, lower extremity edema is all resolved Respiratory: Decreased air movement, I do not appreciate rales or wheezing, at rest, respirations are easy and unlabored Abdomen: No tenderness to palpation or guarding, no ascites or distention MSK: Clubbing of the nails.   Neuro: Awake and alert, rises easily from sleep, answers questions, somewhat confused about situation, but oriented to self and hospital, generalized weakness, symmetric, speech fluent Psych: Oriented to self, attentive to our conversation, judgment and insight appear impaired         Data Reviewed: I have personally reviewed following labs and imaging studies:  CBC: Recent Labs  Lab 04/01/20 0556 04/02/20 0633 04/03/20 0358 04/04/20 0952  WBC 9.5 8.7 8.1 6.2  HGB 13.8 13.7 13.8 13.8  HCT 48.0 49.0 48.7 47.2  MCV 88.1 88.8 88.2 86.0  PLT 170 196 159 173   Basic Metabolic Panel: Recent Labs  Lab 04/01/20 0556 04/02/20 0633 04/03/20 0358 04/04/20 0952 04/05/20 0523 04/06/20 0554 04/07/20 0508  NA 139   < > 142 140 140 139 137  K 4.5   < > 4.3 4.1 3.9 3.9 4.0  CL 95*   < > 96* 91* 89* 87* 86*  CO2 37*   < > 36* 40* 41* 41* 40*  GLUCOSE 114*   < > 65* 139* 139* 93 96  BUN 41*   < > 21 27* 36* 32* 30*  CREATININE 0.73   < > 0.59* 0.62 0.61 0.69 0.52*  CALCIUM 9.3    < > 9.6 9.6 10.0 10.1 10.0  MG 2.2  --   --   --  1.9 1.9 1.8  PHOS  --   --   --   --  3.9 3.4 3.4   < > = values in this interval not displayed.   GFR: Estimated Creatinine Clearance: 76.8 mL/min (A) (by C-G formula based on SCr of 0.52 mg/dL (L)). Liver Function Tests: Recent Labs  Lab 04/04/20 0952  AST 16  ALT 13  ALKPHOS 57  BILITOT 0.8  PROT 6.2*  ALBUMIN 3.1*   No results for input(s): LIPASE, AMYLASE in the last 168 hours. No results for input(s): AMMONIA in the last 168 hours. Coagulation Profile: No results for input(s): INR, PROTIME in the last 168 hours. Cardiac Enzymes: No results for input(s): CKTOTAL, CKMB,  CKMBINDEX, TROPONINI in the last 168 hours. BNP (last 3 results) No results for input(s): PROBNP in the last 8760 hours. HbA1C: No results for input(s): HGBA1C in the last 72 hours. CBG: No results for input(s): GLUCAP in the last 168 hours. Lipid Profile: No results for input(s): CHOL, HDL, LDLCALC, TRIG, CHOLHDL, LDLDIRECT in the last 72 hours. Thyroid Function Tests: No results for input(s): TSH, T4TOTAL, FREET4, T3FREE, THYROIDAB in the last 72 hours. Anemia Panel: No results for input(s): VITAMINB12, FOLATE, FERRITIN, TIBC, IRON, RETICCTPCT in the last 72 hours. Urine analysis: No results found for: COLORURINE, APPEARANCEUR, LABSPEC, PHURINE, GLUCOSEU, HGBUR, BILIRUBINUR, KETONESUR, PROTEINUR, UROBILINOGEN, NITRITE, LEUKOCYTESUR Sepsis Labs: @LABRCNTIP (procalcitonin:4,lacticacidven:4)  ) Recent Results (from the past 240 hour(s))  Resp Panel by RT-PCR (Flu A&B, Covid) Nasopharyngeal Swab     Status: None   Collection Time: 03/31/20  1:28 AM   Specimen: Nasopharyngeal Swab; Nasopharyngeal(NP) swabs in vial transport medium  Result Value Ref Range Status   SARS Coronavirus 2 by RT PCR NEGATIVE NEGATIVE Final    Comment: (NOTE) SARS-CoV-2 target nucleic acids are NOT DETECTED.  The SARS-CoV-2 RNA is generally detectable in upper  respiratory specimens during the acute phase of infection. The lowest concentration of SARS-CoV-2 viral copies this assay can detect is 138 copies/mL. A negative result does not preclude SARS-Cov-2 infection and should not be used as the sole basis for treatment or other patient management decisions. A negative result may occur with  improper specimen collection/handling, submission of specimen other than nasopharyngeal swab, presence of viral mutation(s) within the areas targeted by this assay, and inadequate number of viral copies(<138 copies/mL). A negative result must be combined with clinical observations, patient history, and epidemiological information. The expected result is Negative.  Fact Sheet for Patients:  04/02/20  Fact Sheet for Healthcare Providers:  BloggerCourse.com  This test is no t yet approved or cleared by the [employer]ious[employer].[place] FDA and  has been authorized for detection and/or diagnosis of SARS-CoV-2 by FDA under an Emergency Use Authorization (EUA). This EUA will remain  in effect (meaning this test can be used) for the duration of the COVID-19 declaration under Section 564(b)(1) of the Act, 21 U.S.C.section 360bbb-3(b)(1), unless the authorization is terminated  or revoked sooner.       Influenza A by PCR NEGATIVE NEGATIVE Final   Influenza B by PCR NEGATIVE NEGATIVE Final    Comment: (NOTE) The Xpert Xpress SARS-CoV-2/FLU/RSV plus assay is intended as an aid in the diagnosis of influenza from Nasopharyngeal swab specimens and should not be used as a sole basis for treatment. Nasal washings and aspirates are unacceptable for Xpert Xpress SARS-CoV-2/FLU/RSV testing.  Fact Sheet for Patients: [place]  Fact Sheet for Healthcare Providers: BloggerCourse.com  This test is not yet approved or cleared by the [employer]ious[employer] FDA and has been  authorized for detection and/or diagnosis of SARS-CoV-2 by FDA under an Emergency Use Authorization (EUA). This EUA will remain in effect (meaning this test can be used) for the duration of the COVID-19 declaration under Section 564(b)(1) of the Act, 21 U.S.C. section 360bbb-3(b)(1), unless the authorization is terminated or revoked.  Performed at [facility], [address] [place], [place]   Culture, blood (routine x 2)     Status: None   Collection Time: 03/31/20  1:54 AM   Specimen: BLOOD  Result Value Ref Range Status   Specimen Description BLOOD RIGHT ANTECUBITAL  Final   Special Requests   Final    BOTTLES  DRAWN AEROBIC AND ANAEROBIC Blood Culture adequate volume   Culture   Final    NO GROWTH 5 DAYS Performed at [facility], [address] [place], [place] [address]    Report Status 04/05/2020 FINAL  Final  Culture, blood (routine x 2)     Status: None   Collection Time: 03/31/20  1:55 AM   Specimen: BLOOD  Result Value Ref Range Status   Specimen Description BLOOD RIGHT ANTECUBITAL  Final   Special Requests   Final    BOTTLES DRAWN AEROBIC AND ANAEROBIC Blood Culture adequate volume   Culture   Final    NO GROWTH 5 DAYS Performed at [facility], [address] [place], [place] [address]    Report Status 04/05/2020 FINAL  Final  MRSA PCR Screening     Status: None   Collection Time: 04/01/20  4:00 AM   Specimen: Nasopharyngeal  Result Value Ref Range Status   MRSA by PCR NEGATIVE NEGATIVE Final    Comment:        The GeneXpert MRSA Assay (FDA approved for NASAL specimens only), is one component of a comprehensive MRSA colonization surveillance program. It is not intended to diagnose MRSA infection nor to guide or monitor treatment for MRSA infections. Performed at [facility], [address] [place], [place] [address]          Radiology Studies: No results found.      Scheduled Meds: .  acetylcysteine  2 mL Nebulization BID  . allopurinol  300 mg Oral Daily  . apixaban  5 mg Oral BID  . aspirin EC  81 mg Oral Daily  . atorvastatin  80 mg Oral Daily  . budesonide (PULMICORT) nebulizer solution  0.25 mg Nebulization BID  . erythromycin  1 application Right Eye TID  . escitalopram  20 mg Oral Daily  . feeding supplement  237 mL Oral BID BM  . folic acid  1 mg Oral Daily  . guaiFENesin  600 mg Oral BID  . hydrochlorothiazide  25 mg Oral Daily  . ipratropium-albuterol  3 mL Nebulization BID  . losartan  100 mg Oral Daily  . magnesium oxide  400 mg Oral BID  . mouth rinse  15 mL Mouth Rinse BID  . metoprolol succinate  75 mg Oral Daily  . multivitamin with minerals  1 tablet Oral Daily  . polyvinyl alcohol  1 drop Right Eye Q6H  . prednisoLONE acetate  1 drop Right Eye BID  . predniSONE  40 mg Oral Q breakfast  . thiamine  250 mg Oral Daily   Continuous Infusions: . sodium chloride 250 mL (04/01/20 0520)     LOS: 7 days    Time spent: 25 minutes    Alberteen Sam, MD [facility] 04/07/2020, 2:59 PM     Please page though AMION or Epic secure chat:  For [employer], Higher education careers adviser

## 2020-04-08 DIAGNOSIS — J9621 Acute and chronic respiratory failure with hypoxia: Secondary | ICD-10-CM | POA: Diagnosis not present

## 2020-04-08 LAB — BASIC METABOLIC PANEL
Anion gap: 8 (ref 5–15)
BUN: 35 mg/dL — ABNORMAL HIGH (ref 8–23)
CO2: 44 mmol/L — ABNORMAL HIGH (ref 22–32)
Calcium: 10 mg/dL (ref 8.9–10.3)
Chloride: 86 mmol/L — ABNORMAL LOW (ref 98–111)
Creatinine, Ser: 0.65 mg/dL (ref 0.61–1.24)
GFR, Estimated: >60 mL/min (ref >60)
Glucose, Bld: 88 mg/dL (ref 70–99)
Potassium: 3.8 mmol/L (ref 3.5–5.1)
Sodium: 138 mmol/L (ref 135–145)

## 2020-04-08 LAB — CBC
HCT: 48.3 % (ref 39.0–52.0)
Hemoglobin: 14.3 g/dL (ref 13.0–17.0)
MCH: 25.1 pg — ABNORMAL LOW (ref 26.0–34.0)
MCHC: 29.6 g/dL — ABNORMAL LOW (ref 30.0–36.0)
MCV: 84.9 fL (ref 80.0–100.0)
Platelets: 157 10*3/uL (ref 150–400)
RBC: 5.69 MIL/uL (ref 4.22–5.81)
RDW: 13.7 % (ref 11.5–15.5)
WBC: 11.7 10*3/uL — ABNORMAL HIGH (ref 4.0–10.5)
nRBC: 0 % (ref 0.0–0.2)

## 2020-04-08 LAB — BRAIN NATRIURETIC PEPTIDE: B Natriuretic Peptide: 172.8 pg/mL — ABNORMAL HIGH (ref 0.0–100.0)

## 2020-04-08 LAB — PHOSPHORUS: Phosphorus: 1.8 mg/dL — ABNORMAL LOW (ref 2.5–4.6)

## 2020-04-08 LAB — MAGNESIUM: Magnesium: 1.7 mg/dL (ref 1.7–2.4)

## 2020-04-08 LAB — TROPONIN I (HIGH SENSITIVITY): Troponin I (High Sensitivity): 51 ng/L — ABNORMAL HIGH (ref <18)

## 2020-04-08 MED ORDER — K PHOS MONO-SOD PHOS DI & MONO 155-852-130 MG PO TABS
250.0000 mg | ORAL_TABLET | Freq: Three times a day (TID) | ORAL | Status: AC
  Administered 2020-04-08 – 2020-04-09 (×5): 250 mg via ORAL
  Filled 2020-04-08 (×6): qty 1

## 2020-04-08 NOTE — Progress Notes (Signed)
Physical Therapy Treatment Patient Details Name: Melvin Campbell MRN: [id number] DOB: [birth year] Today's Date: 04/08/2020    History of Present Illness Pt admitted for acute/chronic resp failure with hypoxia now additionally diagnosed with NSTEMI. Pt with complaints of SOB symptoms. Per chart, on 2.5 L of O2 chronic (night).    PT Comments    Pt seen for PT treatment & pt agreeable. Pt received on 4L/min via nasal cannula but SpO2 86-87% so pt increased to 5L/min. Pt continues to maintain O2 saturations 87-89% despite cuing for pursed lip breathing & very rarely achieving 90%. Nurse made aware & called to room, by end of session pt increased to 6L/min but no change in SpO2. Pt is able to ambulate short distances in room with RW & CGA without decrease in oxygen saturation. Pt does report occasional SOB when asked, but otherwise does not appear in distress. Pt would continue to benefit from acute PT services to address activity tolerance, progress gait with LRAD, balance training & strengthening.   HR 53-75 bpm with activity    Follow Up Recommendations  Home health PT     Equipment Recommendations  Rolling walker with 5" wheels    Recommendations for Other Services       Precautions / Restrictions Precautions Precautions: Fall Restrictions Weight Bearing Restrictions: No    Mobility  Bed Mobility Overal bed mobility: Needs Assistance Bed Mobility: Supine to Sit     Supine to sit: Supervision;HOB elevated     General bed mobility comments: use of bed rails    Transfers Overall transfer level: Needs assistance Equipment used: Rolling walker (2 wheeled) Transfers: Sit to/from [employer] Sit to Stand: Min guard Stand pivot transfers: Min guard       General transfer comment: cuing for hands to push up from/reach back for armrests for increased safety during transfers but poor return demo  Ambulation/Gait Ambulation/Gait assistance: Min assist Gait  Distance (Feet): 10 Feet (+ 17 ft) Assistive device: Rolling walker (2 wheeled) Gait Pattern/deviations: Decreased step length - right;Decreased step length - left;Decreased dorsiflexion - left;Decreased stride length;Trunk flexed Gait velocity: decreased   General Gait Details: cuing for safe use & management of RW in small spaces; cuing to avoid obstacles   Stairs             Wheelchair Mobility    Modified Rankin (Stroke Patients Only)       Balance Overall balance assessment: Needs assistance Sitting-balance support: No upper extremity supported;Feet supported Sitting balance-Leahy Scale: Good     Standing balance support: Bilateral upper extremity supported Standing balance-Leahy Scale: Good Standing balance comment: UE support on RW during standing exercises/transfers                            Cognition Arousal/Alertness: Awake/alert Behavior During Therapy: WFL for tasks assessed/performed Overall Cognitive Status: Within Functional Limits for tasks assessed                                 General Comments: slightly confused to situation; inappropriate comments throughout session with PT redirecting pt      Exercises General Exercises - Lower Extremity Long Arc Quad: AROM;Strengthening;Right;Left;10 reps;Seated    General Comments        Pertinent Vitals/Pain Pain Assessment: No/denies pain    Home Living  Prior Function            PT Goals (current goals can now be found in the care plan section) Acute Rehab PT Goals Patient Stated Goal: to get out of bed PT Goal Formulation: With patient Time For Goal Achievement: 04/17/20 Potential to Achieve Goals: Good Additional Goals Additional Goal #1: Pt will be able to perform bed mobility/transfers with mod I and safe technique in order to improve functional independence Progress towards PT goals: Progressing toward goals    Frequency     Min 2X/week      PT Plan Current plan remains appropriate    Co-evaluation              AM-PAC PT "6 Clicks" Mobility   Outcome Measure  Help needed turning from your back to your side while in a flat bed without using bedrails?: None Help needed moving from lying on your back to sitting on the side of a flat bed without using bedrails?: A Little Help needed moving to and from a bed to a chair (including a wheelchair)?: A Little Help needed standing up from a chair using your arms (e.g., wheelchair or bedside chair)?: A Little Help needed to walk in hospital room?: A Little Help needed climbing 3-5 steps with a railing? : A Lot 6 Click Score: 18    End of Session Equipment Utilized During Treatment: Gait belt;Oxygen Activity Tolerance: Patient tolerated treatment well;Treatment limited secondary to medical complications (Comment) Patient left: in chair;with chair alarm set;with call bell/phone within reach (telesitter in room) Nurse Communication: Mobility status (O2) PT Visit Diagnosis: Unsteadiness on feet (R26.81);Muscle weakness (generalized) (M62.81);Difficulty in walking, not elsewhere classified (R26.2)     Time: 6063-0160 PT Time Calculation (min) (ACUTE ONLY): 25 min  Charges:  $Therapeutic Activity: 23-37 mins                     Aleda Grana, PT, DPT 04/08/20, 11:32 AM    Sandi Mariscal 04/08/2020, 11:30 AM

## 2020-04-08 NOTE — Progress Notes (Addendum)
PROGRESS NOTE    Waldo Lainehilip Zwiefelhofer  ZOX:[id number] DOB: 07/29/1940 DOA: 03/31/2020 PCP: Patient, No Pcp Per   Brief Narrative: This 80 years old male with PMH significant for HTN, PE with chronic respiratory failure on 2.5 L of supplemental oxygen at baseline, cognitive impairment, early dementia, alcohol use disorder and depression presents from assisted living facility with worsening shortness of breath.  Patient has some chronic lung disease,  has been on oxygen since October 2021 because of PE.  In the ER Covid test negative,  CTA chest showed no PE but did show bibasilar atelectasis and pneumonia,  advanced emphysema.  Patient was started on antibiotics and has been improving.  Assessment & Plan:   Principal Problem:   Acute on chronic respiratory failure with hypoxia (HCC) Active Problems:   Elevated troponin   Community acquired pneumonia   Bilateral lower extremity edema  Acute on chronic hypoxic and hypercapnic respiratory failure : Patient has long history of unrecognized respiratory disease.     Patient's recent hospitalization for corneal ulcer was complicated by hypoxia which resolved with diuresis. Since then, he has been on 2-3L O2 at ALF.   He was initially admitted for hypoxia,  CTA showed aspiration pneumonia.   He has completed 5 days antibiotics without improvement in O2. He was later started on steroids, now completed 5 days, O2 improved considerably in the last 2 days with steroids and again Lasix. His O2 requirement weaned to 5L , still desaturating with ambulation here. Transitioned Solu-Medrol to prednisone Continue Pulmicort Continue scheduled bronchodilators for now, taper to PRN at discharge Continue Metanebs, incentive spirometry and aggressive Chest PT He was given Lasix once.  BNP 172 -If SpO2 stable at <= 5lpm tomorrow, will plan for d/c back to ALF.  Elevated troponins: This could be secondary to demand ischemia. Troponins trending up, cardiology  consulted. Patient denies any chest pain.  Patient was heparinized for 48 hours. Cardiology consulted, defer left heart cath in the absence of chest pain and normal echocardiogram.   History pulmonary embolism Continue apixaban  Hypertension Continue HCTZ, losartan, metoprolol    Bradycardia Asymptomatic, while sleeping.   Heart rates mostly in the 50s and 60s, stable on home dose of metoprolol  Gout Denies Gout symptoms. -Continue allopurinol  Depression -Continue escitalopram  Dementia Patient has some memory impairment, this appears to be worsened by hypoxia. It seems improved.  Moderate protein calorie malnutrition As evidenced by severe chronic illness, poor p.o. intake Phos, mag stable.  -Continue Ensure   DVT prophylaxis: Eliquis Code Status: Full code. Family Communication:  No family at bed side. Disposition Plan:   Status is: Inpatient  Remains inpatient appropriate because:Inpatient level of care appropriate due to severity of illness   Dispo:  Patient From: Assisted Living Facility  Planned Disposition: Skilled Nursing Facility  Expected discharge date: 1-2 days  Medically stable for discharge: No     Consultants:    Pulmonology  Procedures: CTA chest.    Antimicrobials:   Anti-infectives (From admission, onward)   Start     Dose/Rate Route Frequency Ordered Stop   03/31/20 0500  cefTRIAXone (ROCEPHIN) 2 g in sodium chloride 0.9 % 100 mL IVPB        2 g 200 mL/hr over 30 Minutes Intravenous Every 24 hours 03/31/20 0445 04/04/20 0546   03/31/20 0500  azithromycin (ZITHROMAX) 500 mg in sodium chloride 0.9 % 250 mL IVPB        500 mg 250 mL/hr over 60 Minutes Intravenous  Every 24 hours 03/31/20 0445 04/04/20 0701      Subjective: Patient was seen and examined at bedside.  Overnight events noted.  Patient seems alert and oriented X 2.   Denies any chest pain.  He reports falling down last night while going to the bathroom,  he has  a left arm laceration covered with dressing. He is still requiring oxygen at 6 L saturation 92%.  Objective: Vitals:   04/08/20 0101 04/08/20 0318 04/08/20 0737 04/08/20 0743  BP: 121/69 (!) 153/80  135/79  Pulse: 60 61  (!) 53  Resp: 18 20  20   Temp: 98 F (36.7 C) 97.6 F (36.4 C)  98.3 F (36.8 C)  TempSrc: Oral Oral  Oral  SpO2: 93% 91% 90% 93%  Weight:      Height:        Intake/Output Summary (Last 24 hours) at 04/08/2020 1251 Last data filed at 04/08/2020 1010 Gross per 24 hour  Intake 240 ml  Output 425 ml  Net -185 ml   Filed Weights   04/05/20 0313 04/06/20 0202 04/07/20 0348  Weight: 81.3 kg 81.6 kg 82 kg    Examination:  General exam: Appears calm and comfortable, not in any acute distress. Respiratory system: Clear to auscultation. Respiratory effort normal. Cardiovascular system: S1 & S2 heard, RRR. No JVD, murmurs, rubs, gallops or clicks. No pedal edema. Gastrointestinal system: Abdomen is nondistended, soft and nontender. No organomegaly or masses felt. Normal bowel sounds heard. Central nervous system: Alert and oriented. No focal neurological deficits. Extremities: Left arm laceration from recent fall.  No edema, no cyanosis, no clubbing. Skin: No rashes, lesions or ulcers Psychiatry: Judgement and insight appear normal. Mood & affect appropriate.     Data Reviewed: I have personally reviewed following labs and imaging studies  CBC: Recent Labs  Lab 04/02/20 0633 04/03/20 0358 04/04/20 0952 04/08/20 0515  WBC 8.7 8.1 6.2 11.7*  HGB 13.7 13.8 13.8 14.3  HCT 49.0 48.7 47.2 48.3  MCV 88.8 88.2 86.0 84.9  PLT 196 159 173 157   Basic Metabolic Panel: Recent Labs  Lab 04/04/20 0952 04/05/20 0523 04/06/20 0554 04/07/20 0508 04/08/20 0515  NA 140 140 139 137 138  K 4.1 3.9 3.9 4.0 3.8  CL 91* 89* 87* 86* 86*  CO2 40* 41* 41* 40* 44*  GLUCOSE 139* 139* 93 96 88  BUN 27* 36* 32* 30* 35*  CREATININE 0.62 0.61 0.69 0.52* 0.65  CALCIUM 9.6  10.0 10.1 10.0 10.0  MG  --  1.9 1.9 1.8 1.7  PHOS  --  3.9 3.4 3.4 1.8*   GFR: Estimated Creatinine Clearance: 76.8 mL/min (by C-G formula based on SCr of 0.65 mg/dL). Liver Function Tests: Recent Labs  Lab 04/04/20 0952  AST 16  ALT 13  ALKPHOS 57  BILITOT 0.8  PROT 6.2*  ALBUMIN 3.1*   No results for input(s): LIPASE, AMYLASE in the last 168 hours. No results for input(s): AMMONIA in the last 168 hours. Coagulation Profile: No results for input(s): INR, PROTIME in the last 168 hours. Cardiac Enzymes: No results for input(s): CKTOTAL, CKMB, CKMBINDEX, TROPONINI in the last 168 hours. BNP (last 3 results) No results for input(s): PROBNP in the last 8760 hours. HbA1C: No results for input(s): HGBA1C in the last 72 hours. CBG: No results for input(s): GLUCAP in the last 168 hours. Lipid Profile: No results for input(s): CHOL, HDL, LDLCALC, TRIG, CHOLHDL, LDLDIRECT in the last 72 hours. Thyroid Function Tests: No results  for input(s): TSH, T4TOTAL, FREET4, T3FREE, THYROIDAB in the last 72 hours. Anemia Panel: No results for input(s): VITAMINB12, FOLATE, FERRITIN, TIBC, IRON, RETICCTPCT in the last 72 hours. Sepsis Labs: No results for input(s): PROCALCITON, LATICACIDVEN in the last 168 hours.  Recent Results (from the past 240 hour(s))  Resp Panel by RT-PCR (Flu A&B, Covid) Nasopharyngeal Swab     Status: None   Collection Time: 03/31/20  1:28 AM   Specimen: Nasopharyngeal Swab; Nasopharyngeal(NP) swabs in vial transport medium  Result Value Ref Range Status   SARS Coronavirus 2 by RT PCR NEGATIVE NEGATIVE Final    Comment: (NOTE) SARS-CoV-2 target nucleic acids are NOT DETECTED.  The SARS-CoV-2 RNA is generally detectable in upper respiratory specimens during the acute phase of infection. The lowest concentration of SARS-CoV-2 viral copies this assay can detect is 138 copies/mL. A negative result does not preclude SARS-Cov-2 infection and should not be used as the  sole basis for treatment or other patient management decisions. A negative result may occur with  improper specimen collection/handling, submission of specimen other than nasopharyngeal swab, presence of viral mutation(s) within the areas targeted by this assay, and inadequate number of viral copies(<138 copies/mL). A negative result must be combined with clinical observations, patient history, and epidemiological information. The expected result is Negative.  Fact Sheet for Patients:  BloggerCourse.com  Fact Sheet for Healthcare Providers:  SeriousBroker.it  This test is no t yet approved or cleared by the [place] FDA and  has been authorized for detection and/or diagnosis of SARS-CoV-2 by FDA under an Emergency Use Authorization (EUA). This EUA will remain  in effect (meaning this test can be used) for the duration of the COVID-19 declaration under Section 564(b)(1) of the Act, 21 U.S.C.section 360bbb-3(b)(1), unless the authorization is terminated  or revoked sooner.       Influenza A by PCR NEGATIVE NEGATIVE Final   Influenza B by PCR NEGATIVE NEGATIVE Final    Comment: (NOTE) The Xpert Xpress SARS-CoV-2/FLU/RSV plus assay is intended as an aid in the diagnosis of influenza from Nasopharyngeal swab specimens and should not be used as a sole basis for treatment. Nasal washings and aspirates are unacceptable for Xpert Xpress SARS-CoV-2/FLU/RSV testing.  Fact Sheet for Patients: BloggerCourse.com  Fact Sheet for Healthcare Providers: SeriousBroker.it  This test is not yet approved or cleared by the [place] FDA and has been authorized for detection and/or diagnosis of SARS-CoV-2 by FDA under an Emergency Use Authorization (EUA). This EUA will remain in effect (meaning this test can be used) for the duration of the COVID-19 declaration under Section 564(b)(1) of the  Act, 21 U.S.C. section 360bbb-3(b)(1), unless the authorization is terminated or revoked.  Performed at [facility], [address] [place], [place] [address]   Culture, blood (routine x 2)     Status: None   Collection Time: 03/31/20  1:54 AM   Specimen: BLOOD  Result Value Ref Range Status   Specimen Description BLOOD RIGHT ANTECUBITAL  Final   Special Requests   Final    BOTTLES DRAWN AEROBIC AND ANAEROBIC Blood Culture adequate volume   Culture   Final    NO GROWTH 5 DAYS Performed at [facility], [address] [place], [place] [address]    Report Status 04/05/2020 FINAL  Final  Culture, blood (routine x 2)     Status: None   Collection Time: 03/31/20  1:55 AM   Specimen: BLOOD  Result Value Ref Range Status  Specimen Description BLOOD RIGHT ANTECUBITAL  Final   Special Requests   Final    BOTTLES DRAWN AEROBIC AND ANAEROBIC Blood Culture adequate volume   Culture   Final    NO GROWTH 5 DAYS Performed at [facility], [address] [place], [place] [address]    Report Status 04/05/2020 FINAL  Final  MRSA PCR Screening     Status: None   Collection Time: 04/01/20  4:00 AM   Specimen: Nasopharyngeal  Result Value Ref Range Status   MRSA by PCR NEGATIVE NEGATIVE Final    Comment:        The GeneXpert MRSA Assay (FDA approved for NASAL specimens only), is one component of a comprehensive MRSA colonization surveillance program. It is not intended to diagnose MRSA infection nor to guide or monitor treatment for MRSA infections. Performed at [facility], [address] [place], [place] [address]    Radiology Studies: No results found.  Scheduled Meds: . acetylcysteine  2 mL Nebulization BID  . allopurinol  300 mg Oral Daily  . apixaban  5 mg Oral BID  . aspirin EC  81 mg Oral Daily  . atorvastatin  80 mg Oral Daily  . budesonide (PULMICORT) nebulizer solution  0.25 mg Nebulization BID  . erythromycin  1  application Right Eye TID  . escitalopram  20 mg Oral Daily  . feeding supplement  237 mL Oral BID BM  . folic acid  1 mg Oral Daily  . guaiFENesin  600 mg Oral BID  . hydrochlorothiazide  25 mg Oral Daily  . influenza vaccine adjuvanted  0.5 mL Intramuscular Tomorrow-1000  . ipratropium-albuterol  3 mL Nebulization BID  . losartan  100 mg Oral Daily  . magnesium oxide  400 mg Oral BID  . mouth rinse  15 mL Mouth Rinse BID  . metoprolol succinate  75 mg Oral Daily  . multivitamin with minerals  1 tablet Oral Daily  . phosphorus  250 mg Oral TID  . polyvinyl alcohol  1 drop Right Eye Q6H  . prednisoLONE acetate  1 drop Right Eye BID  . predniSONE  40 mg Oral Q breakfast  . thiamine  250 mg Oral Daily   Continuous Infusions: . sodium chloride 250 mL (04/01/20 0520)     LOS: 8 days    Time spent: 35 mins.    Cipriano Bunker, MD [facility]   If 7PM-7AM, please contact night-coverage

## 2020-04-08 NOTE — Care Management Important Message (Signed)
Important Message  Patient Details  Name: Melvin Campbell MRN: [id number] Date of Birth: [birth year]   Medicare Important Message Given:  Yes     Johnell Comings 04/08/2020, 11:32 AM

## 2020-04-08 NOTE — TOC Progression Note (Signed)
Transition of Care [facility]) - Progression Note    Patient Details  Name: Melvin Campbell MRN: [id number] Date of Birth: [birth year]  Transition of Care [facility]) CM/SW Contact  Chapman Fitch, RN Phone Number: 04/08/2020, 2:25 PM  Clinical Narrative:     Message left for Keda/Dustin at [facility] [place] to determine the max liter of flow of O2 they can accept.   Patient currently requiring 4-6 liters        Expected Discharge Plan and Services                                                 Social Determinants of Health (SDOH) Interventions    Readmission Risk Interventions No flowsheet data found.

## 2020-04-08 NOTE — Progress Notes (Signed)
Mobility Specialist - Progress Note   04/08/20 1144  Mobility  Activity Ambulated in hall  Level of Assistance Minimal assist, patient does 75% or more  Assistive Device Front wheel walker  Distance Ambulated (ft) 210 ft  Mobility Response Tolerated well  Mobility performed by Mobility specialist  $Mobility charge 1 Mobility    Pre-mobility: 66 HR, 71% SpO2 During mobility: 73 HR, 93% SpO2   Pt was using restroom upon arrival with NT in room. Pt agreed to session. Pt stood to RW from toilet with supervision and performed hygiene tasks independently. Noted pt's O2 desat to 71% as [place] is resting on pt's upper lip, no s/s of distress. An extensive rest break was taken and O2 was increased to 6L. After 1-2 minutes, pt's O2 > 90%. Pt continued ambulation into hallway with no LOB noted. Pt voiced mild weakness in LE during activity, but continuously denied SOB. O2 maintaining mid 90s. Overall, pt tolerated session well. Further mobility limited d/t pt's request to return for a nap. Pt was left in bed with all needs in reach and alarm set. Pt's O2 returned to 5L prior to exit.    Filiberto Pinks Mobility Specialist 04/08/20, 11:56 AM

## 2020-04-09 DIAGNOSIS — J9621 Acute and chronic respiratory failure with hypoxia: Secondary | ICD-10-CM | POA: Diagnosis not present

## 2020-04-09 LAB — BASIC METABOLIC PANEL
Anion gap: 9 (ref 5–15)
BUN: 29 mg/dL — ABNORMAL HIGH (ref 8–23)
CO2: 40 mmol/L — ABNORMAL HIGH (ref 22–32)
Calcium: 9.5 mg/dL (ref 8.9–10.3)
Chloride: 89 mmol/L — ABNORMAL LOW (ref 98–111)
Creatinine, Ser: 0.49 mg/dL — ABNORMAL LOW (ref 0.61–1.24)
GFR, Estimated: >60 mL/min (ref >60)
Glucose, Bld: 98 mg/dL (ref 70–99)
Potassium: 3.9 mmol/L (ref 3.5–5.1)
Sodium: 138 mmol/L (ref 135–145)

## 2020-04-09 LAB — PHOSPHORUS: Phosphorus: 3.6 mg/dL (ref 2.5–4.6)

## 2020-04-09 LAB — MAGNESIUM: Magnesium: 1.9 mg/dL (ref 1.7–2.4)

## 2020-04-09 NOTE — TOC Progression Note (Signed)
Transition of Care [facility]) - Progression Note    Patient Details  Name: Melvin Campbell MRN: [id number] Date of Birth: December 21, 1940  Transition of Care [facility]) CM/SW Contact  Chapman Fitch, RN Phone Number: 04/09/2020, 1:11 PM  Clinical Narrative:     Sherron Monday with [name] at the [facility].  No concerns about patient returning with resumption of home health through [facility].  Per [name] at the [facility] only goes up to 5L.  He is in agreement for me to have concentrators switched out to one that has a higher liter flow  Per Elease Hashimoto with [employer] they will need new qualifying sats and O2 order at discharge in order to switch out concentrator.  Daughter at bedside and updated        Expected Discharge Plan and Services                                                 Social Determinants of Health (SDOH) Interventions    Readmission Risk Interventions No flowsheet data found.

## 2020-04-09 NOTE — Progress Notes (Signed)
Pulmonary Medicine          Date: 04/09/2020,   MRN# [id number] Melvin Campbell Sep 04, 1940     AdmissionWeight: 84.8 kg                 CurrentWeight: 82 kg   Referring physician: Dr Maryfrances Bunnell   CHIEF COMPLAINT:   Chronic hypercapnic and hypoxemic respiratory failure   HISTORY OF PRESENT ILLNESS   Melvin Campbell is a 80 y.o. male with medical history significant for hypertension, dyslipidemia and PE with chronic respiratory failure on home O2 at 2.5 L/min by nasal cannula, and dementia who presented to the emergency room with acute onset of worsening dyspnea at his skilled nursing facility with associated hypoxemia with a pulse oximetry of 87% on his O2 at 2.5 L and wheezing.  Pulse oximetry improved to 93% on 4 L of O2.  He denied chest pain nausea vomiting or diaphoresis or palpitations.  He admited to chronic lower extremity edema.  No fever or chills.  No bleeding diathesis.  No dysuria, oliguria or hematuria or flank pain.   Labs revealed a BUN of 39 creatinine 0.74, BNP of 23.2 and high-sensitivity troponin of 1263 with lactic acid of 1 and CBC levels unremarkable.  Influenza antigens and COVID-19 PCR came back negative.  Blood cultures were drawn.  INR is 1.4 and PT 16.8 with PTT 45. Chest CTA revealed no PE.  Showed cardiomegaly and dilated main pulmonary artery consistent with pulmonary artery hypertension by basal independent right middle lobe consolidation with volume loss likely due to atelectasis and differential diagnosis would include pneumonia.  Aortic atherosclerosis tortuosity and moderate emphysema. Patient has been on BIPAP and has had recurrent hypercanpnia  04/04/20- patient is stable, he is speaking in full sentences. HFNC weaned to 12 and can be further weaned. Plan to continue current care.  His steroids are being weaned.   04/09/20- patient significantly improved now to 5L/min [place].    PAST MEDICAL HISTORY   History reviewed. No pertinent past medical  history.   SURGICAL HISTORY   No available surgical data available at this time  FAMILY HISTORY   No family history on file.   SOCIAL HISTORY   Social History   Tobacco Use  . Smoking status: Never Smoker  . Smokeless tobacco: Never Used     MEDICATIONS    Home Medication:    Current Medication:  Current Facility-Administered Medications:  .  0.9 %  sodium chloride infusion, , Intravenous, PRN, Mansy, Jan A, MD, Last Rate: 4 mL/hr at 04/01/20 0520, 250 mL at 04/01/20 0520 .  acetaminophen (TYLENOL) tablet 650 mg, 650 mg, Oral, Q4H PRN, Mansy, Jan A, MD, 650 mg at 03/31/20 0742 .  acetylcysteine (MUCOMYST) 20 % nebulizer / oral solution 2 mL, 2 mL, Nebulization, BID, Danford, Earl Lites, MD, 2 mL at 04/09/20 0738 .  albuterol (PROVENTIL) (2.5 MG/3ML) 0.083% nebulizer solution 2.5 mg, 2.5 mg, Nebulization, Q2H PRN, Danford, Earl Lites, MD .  allopurinol (ZYLOPRIM) tablet 300 mg, 300 mg, Oral, Daily, Mansy, Jan A, MD, 300 mg at 04/09/20 1027 .  ALPRAZolam Prudy Feeler) tablet 0.25 mg, 0.25 mg, Oral, BID PRN, Mansy, Jan A, MD, 0.25 mg at 04/06/20 2108 .  apixaban (ELIQUIS) tablet 5 mg, 5 mg, Oral, BID, Ronnald Ramp, RPH, 5 mg at 04/09/20 1024 .  aspirin EC tablet 81 mg, 81 mg, Oral, Daily, Leanora Ivanoff, PA-C, 81 mg at 04/09/20 1026 .  atorvastatin (LIPITOR) tablet 80 mg, 80  mg, Oral, Daily, Mansy, Jan A, MD, 80 mg at 04/09/20 1027 .  budesonide (PULMICORT) nebulizer solution 0.25 mg, 0.25 mg, Nebulization, BID, Esaw GrandchildGriffith, Kelly A, DO, 0.25 mg at 04/09/20 [id number] .  erythromycin ophthalmic ointment 1 application, 1 application, Right Eye, TID, Mansy, Vernetta HoneyJan A, MD, 1 application at 04/09/20 1046 .  escitalopram (LEXAPRO) tablet 20 mg, 20 mg, Oral, Daily, Mansy, Jan A, MD, 20 mg at 04/09/20 1027 .  feeding supplement (ENSURE ENLIVE / ENSURE PLUS) liquid 237 mL, 237 mL, Oral, BID BM, Danford, Earl Liteshristopher P, MD, 237 mL at 04/09/20 1047 .  folic acid (FOLVITE) tablet 1 mg, 1 mg, Oral,  Daily, Mansy, Jan A, MD, 1 mg at 04/09/20 1026 .  furosemide (LASIX) tablet 20 mg, 20 mg, Oral, Daily PRN, Mansy, Jan A, MD, 20 mg at 03/31/20 0742 .  guaiFENesin (MUCINEX) 12 hr tablet 600 mg, 600 mg, Oral, BID, Mansy, Jan A, MD, 600 mg at 04/09/20 1024 .  hydrochlorothiazide (HYDRODIURIL) tablet 25 mg, 25 mg, Oral, Daily, Mansy, Jan A, MD, 25 mg at 04/09/20 1025 .  influenza vaccine adjuvanted (FLUAD) injection 0.5 mL, 0.5 mL, Intramuscular, Tomorrow-1000, Danford, Christopher P, MD .  ipratropium-albuterol (DUONEB) 0.5-2.5 (3) MG/3ML nebulizer solution 3 mL, 3 mL, Nebulization, BID, Danford, Earl Liteshristopher P, MD, 3 mL at 04/09/20 0738 .  losartan (COZAAR) tablet 100 mg, 100 mg, Oral, Daily, Mansy, Jan A, MD, 100 mg at 04/09/20 1025 .  magnesium oxide (MAG-OX) tablet 400 mg, 400 mg, Oral, BID, Mansy, Jan A, MD, 400 mg at 04/09/20 1026 .  MEDLINE mouth rinse, 15 mL, Mouth Rinse, BID, Mansy, Jan A, MD, 15 mL at 04/08/20 2146 .  metoprolol succinate (TOPROL-XL) 24 hr tablet 75 mg, 75 mg, Oral, Daily, Mansy, Jan A, MD, 75 mg at 04/09/20 1025 .  multivitamin with minerals tablet 1 tablet, 1 tablet, Oral, Daily, Mansy, Jan A, MD, 1 tablet at 04/09/20 1027 .  nitroGLYCERIN (NITROSTAT) SL tablet 0.4 mg, 0.4 mg, Sublingual, Q5 Min x 3 PRN, Mansy, Jan A, MD .  ondansetron [facility](ZOFRAN) injection 4 mg, 4 mg, Intravenous, Q6H PRN, Mansy, Jan A, MD .  phosphorus (K PHOS NEUTRAL) tablet 250 mg, 250 mg, Oral, TID, Cipriano BunkerKumar, Pardeep, MD, 250 mg at 04/09/20 1027 .  polyvinyl alcohol (LIQUIFILM TEARS) 1.4 % ophthalmic solution 1 drop, 1 drop, Right Eye, Q6H, Mansy, Jan A, MD, 1 drop at 04/09/20 0430 .  prednisoLONE acetate (PRED FORTE) 1 % ophthalmic suspension 1 drop, 1 drop, Right Eye, BID, Mansy, Jan A, MD, 1 drop at 04/09/20 1041 .  thiamine tablet 250 mg, 250 mg, Oral, Daily, Esaw GrandchildGriffith, Kelly A, DO, 250 mg at 04/09/20 1025 .  zolpidem (AMBIEN) tablet 5 mg, 5 mg, Oral, QHS PRN, Mansy, Jan A, MD, 5 mg at 04/06/20  2108    ALLERGIES   Amlodipine     REVIEW OF SYSTEMS    Review of Systems:  Gen:  Denies  fever, sweats, chills weigh loss  HEENT: Denies blurred vision, double vision, ear pain, eye pain, hearing loss, nose bleeds, sore throat Cardiac:  No dizziness, chest pain or heaviness, chest tightness,edema Resp:   Denies cough or sputum porduction, shortness of breath,wheezing, hemoptysis,  Gi: Denies swallowing difficulty, stomach pain, nausea or vomiting, diarrhea, constipation, bowel incontinence Gu:  Denies bladder incontinence, burning urine Ext:   Denies Joint pain, stiffness or swelling Skin: Denies  skin rash, easy bruising or bleeding or hives Endoc:  Denies polyuria, polydipsia , polyphagia or weight change  Psych:   Denies depression, insomnia or hallucinations   Other:  All other systems negative   VS: BP 129/72 (BP Location: Left Arm)   Pulse 65   Temp 97.9 F (36.6 C)   Resp 20   Ht 5\' 7"  (1.702 m)   Wt 82 kg   SpO2 94%   BMI 28.32 kg/m      PHYSICAL EXAM    GENERAL:NAD, no fevers, chills, no weakness no fatigue HEAD: Normocephalic, atraumatic.  EYES: Pupils equal, round, reactive to light. Extraocular muscles intact. No scleral icterus.  MOUTH: Moist mucosal membrane. Dentition intact. No abscess noted.  EAR, NOSE, THROAT: Clear without exudates. No external lesions.  NECK: Supple. No thyromegaly. No nodules. No JVD.  PULMONARY: crackles at bases bilaterally  CARDIOVASCULAR: S1 and S2. Regular rate and rhythm. No murmurs, rubs, or gallops. No edema. Pedal pulses 2+ bilaterally.  GASTROINTESTINAL: Soft, nontender, nondistended. No masses. Positive bowel sounds. No hepatosplenomegaly.  MUSCULOSKELETAL: No swelling, clubbing, or edema. Range of motion full in all extremities.  NEUROLOGIC: Cranial nerves II through XII are intact. No gross focal neurological deficits. Sensation intact. Reflexes intact.  SKIN: No ulceration, lesions, rashes, or cyanosis. Skin  warm and dry. Turgor intact.  PSYCHIATRIC: Mood, affect within normal limits. The patient is awake, alert and oriented x 3. Insight, judgment intact.       IMAGING    DG Chest 2 View  Result Date: 03/31/2020 CLINICAL DATA:  Shortness of breath. EXAM: CHEST - 2 VIEW COMPARISON:  None available. FINDINGS: Low lung volumes limit assessment. Heart size upper normal. Aortic atherosclerosis and tortuosity. There are streaky bibasilar opacities. Mild diffuse peribronchial thickening. No significant pleural effusion. No pneumothorax. Degenerative change throughout the spine. Right shoulder arthroplasty. No acute osseous abnormalities are seen. IMPRESSION: 1. Low lung volumes with streaky bibasilar opacities, favoring atelectasis. Mild diffuse peribronchial thickening. 2. Diffuse aortic atherosclerosis. Aortic tortuosity. Aortic Atherosclerosis (ICD10-I70.0). Electronically Signed   By: 04/02/2020 M.D.   On: 03/31/2020 01:42   CT Angio Chest PE W/Cm &/Or Wo Cm  Result Date: 03/31/2020 CLINICAL DATA:  PE suspected, high prob PE in 01/2020; hypoxic EXAM: CT ANGIOGRAPHY CHEST WITH CONTRAST TECHNIQUE: Multidetector CT imaging of the chest was performed using the standard protocol during bolus administration of intravenous contrast. Multiplanar CT image reconstructions and MIPs were obtained to evaluate the vascular anatomy. CONTRAST:  02/2020 OMNIPAQUE IOHEXOL 350 MG/ML SOLN COMPARISON:  Chest radiograph earlier today.  No prior CT available. FINDINGS: Cardiovascular: No evidence of acute or chronic pulmonary embolus. There are no intraluminal pulmonary arterial filling defects. No intraluminal webs. Dilated main pulmonary artery at 4 cm. Multi chamber cardiomegaly. Coronary artery calcifications. Aortic atherosclerosis and tortuosity. Cannot assess for dissection given phase of contrast tailored to pulmonary artery evaluation. No pericardial effusion. Mediastinum/Nodes: Prominent right lower paratracheal node  measures 10 mm. 10 mm left hilar node. No visualized thyroid nodule. Patulous esophagus. Lungs/Pleura: Bibasilar and dependent right middle lobe consolidation. Bibasilar volume loss. Moderate emphysema. No septal thickening or pulmonary edema no significant pleural effusion. Upper Abdomen: Left perinephric edema is partially included, nonspecific. Lobulated splenic contours. Musculoskeletal: Diffuse thoracic spondylosis. Subacute or remote lower lateral left rib fractures with callus formation. Right shoulder arthroplasty. Review of the MIP images confirms the above findings. IMPRESSION: 1. No pulmonary embolus. Cardiomegaly. Dilated main pulmonary artery consistent with pulmonary arterial hypertension. 2. Bibasilar and dependent right middle lobe consolidation with volume loss. Atelectasis is favored, however sterility is indeterminate by imaging, recommend correlation for  pneumonia symptoms. 3. Nonspecific left perinephric edema in the upper abdomen is partially included, may be chronic or seen with urinary tract infection. 4. Aortic atherosclerosis and tortuosity. 5. Moderate emphysema Aortic Atherosclerosis (ICD10-I70.0) and Emphysema (ICD10-J43.9). Electronically Signed   By: Narda Rutherford M.D.   On: 03/31/2020 03:43   DG Chest Port 1 View  Result Date: 04/03/2020 CLINICAL DATA:  Respiratory failure with hypoxia EXAM: PORTABLE CHEST 1 VIEW COMPARISON:  March 31, 2020 chest radiograph and chest CT FINDINGS: There is underlying parenchymal fibrotic type change. There are areas of scattered atelectatic change. There is no frank edema or consolidation. Heart is upper normal in size with pulmonary vascularity normal. No adenopathy. There is aortic atherosclerosis. 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 There is right carotid artery calcification. IMPRESSION: Underlying fibrosis and scattered areas of atelectasis. No edema or airspace opacity. Stable cardiac silhouette. Aortic Atherosclerosis  (ICD10-I70.0). There is right carotid artery calcification. Electronically Signed   By: Bretta Bang III M.D.   On: 04/03/2020 12:19   ECHOCARDIOGRAM COMPLETE  Result Date: 04/01/2020    ECHOCARDIOGRAM REPORT   Patient Name:   Melvin Campbell Date of Exam: 04/01/2020 Medical Rec #:  [id number]        Height:       67.0 in Accession #:    [id number]       Weight:       187.0 lb Date of Birth:  1940-11-25         BSA:          1.965 m Patient Age:    79 years         BP:           125/61 mmHg Patient Gender: M                HR:           58 bpm. Exam Location:  [facility] Procedure: 2D Echo, Cardiac Doppler and Color Doppler Indications:     NSTEMI I21.4  History:         Patient has no prior history of Echocardiogram examinations. No                  medical history on file.  Sonographer:     Cristela Blue RDCS (AE) Referring Phys:  [id number] Vernetta Honey MANSY Diagnosing Phys: Lorine Bears MD  Sonographer Comments: Suboptimal apical window. IMPRESSIONS  1. Left ventricular ejection fraction, by estimation, is 60 to 65%. The left ventricle has normal function. The left ventricle has no regional wall motion abnormalities. There is moderate left ventricular hypertrophy. Left ventricular diastolic parameters are consistent with Grade II diastolic dysfunction (pseudonormalization).  2. Right ventricular systolic function is normal. The right ventricular size is normal. Tricuspid regurgitation signal is inadequate for assessing PA pressure.  3. Left atrial size was mildly dilated.  4. The mitral valve is normal in structure. No evidence of mitral valve regurgitation. No evidence of mitral stenosis. Moderate mitral annular calcification.  5. The aortic valve is normal in structure. Aortic valve regurgitation is not visualized. Mild aortic valve stenosis.  6. Aortic dilatation noted. There is mild dilatation of the ascending aorta, measuring 41 mm.  7. The inferior vena cava is dilated in size with <50% respiratory variability,  suggesting right atrial pressure of 15 mmHg. FINDINGS  Left Ventricle: Left ventricular ejection fraction, by estimation, is 60 to 65%. The left ventricle has normal function. The left ventricle has no regional  wall motion abnormalities. The left ventricular internal cavity size was normal in size. There is  moderate left ventricular hypertrophy. Left ventricular diastolic parameters are consistent with Grade II diastolic dysfunction (pseudonormalization). Right Ventricle: The right ventricular size is normal. No increase in right ventricular wall thickness. Right ventricular systolic function is normal. Tricuspid regurgitation signal is inadequate for assessing PA pressure. Left Atrium: Left atrial size was mildly dilated. Right Atrium: Right atrial size was normal in size. Pericardium: There is no evidence of pericardial effusion. Mitral Valve: The mitral valve is normal in structure. Moderate mitral annular calcification. No evidence of mitral valve regurgitation. No evidence of mitral valve stenosis. Tricuspid Valve: The tricuspid valve is normal in structure. Tricuspid valve regurgitation is not demonstrated. No evidence of tricuspid stenosis. Aortic Valve: The aortic valve is normal in structure. Aortic valve regurgitation is not visualized. Mild aortic stenosis is present. Aortic valve mean gradient measures 4.5 mmHg. Aortic valve peak gradient measures 7.7 mmHg. Aortic valve area, by VTI measures 2.13 cm. Pulmonic Valve: The pulmonic valve was normal in structure. Pulmonic valve regurgitation is not visualized. No evidence of pulmonic stenosis. Aorta: The aortic root is normal in size and structure and aortic dilatation noted. There is mild dilatation of the ascending aorta, measuring 41 mm. Venous: The inferior vena cava is dilated in size with less than 50% respiratory variability, suggesting right atrial pressure of 15 mmHg. IAS/Shunts: No atrial level shunt detected by color flow Doppler.  LEFT  VENTRICLE PLAX 2D LVIDd:         4.14 cm  Diastology LVIDs:         2.69 cm  LV e' medial:    4.24 cm/s LV PW:         1.14 cm  LV E/e' medial:  22.6 LV IVS:        1.16 cm  LV e' lateral:   4.57 cm/s LVOT diam:     2.20 cm  LV E/e' lateral: 21.0 LV SV:         73 LV SV Index:   37 LVOT Area:     3.80 cm  RIGHT VENTRICLE RV Basal diam:  3.37 cm RV S prime:     9.46 cm/s TAPSE (M-mode): 3.8 cm LEFT ATRIUM             Index       RIGHT ATRIUM           Index LA diam:        3.90 cm 1.98 cm/m  RA Area:     10.60 cm LA Vol (A2C):   78.4 ml 39.89 ml/m RA Volume:   20.70 ml  10.53 ml/m LA Vol (A4C):   70.5 ml 35.87 ml/m LA Biplane Vol: 79.9 ml 40.66 ml/m  AORTIC VALVE                    PULMONIC VALVE AV Area (Vmax):    2.13 cm     PV Vmax:        0.69 m/s AV Area (Vmean):   2.18 cm     PV Peak grad:   1.9 mmHg AV Area (VTI):     2.13 cm     RVOT Peak grad: 2 mmHg AV Vmax:           138.50 cm/s AV Vmean:          101.500 cm/s AV VTI:  0.342 m AV Peak Grad:      7.7 mmHg AV Mean Grad:      4.5 mmHg LVOT Vmax:         77.70 cm/s LVOT Vmean:        58.300 cm/s LVOT VTI:          0.192 m LVOT/AV VTI ratio: 0.56  AORTA Ao Root diam: 4.00 cm MITRAL VALVE                TRICUSPID VALVE MV Area (PHT): 3.95 cm     TR Peak grad:   8.4 mmHg MV Decel Time: 192 msec     TR Vmax:        145.00 cm/s MV E velocity: 96.00 cm/s MV A velocity: 113.00 cm/s  SHUNTS MV E/A ratio:  0.85         Systemic VTI:  0.19 m                             Systemic Diam: 2.20 cm Lorine Bears MD Electronically signed by Lorine Bears MD Signature Date/Time: 04/01/2020/10:36:14 AM    Final            ASSESSMENT/PLAN   Acute on chronic hypercapnic hypoxemic respiratory failure   - patient has borderline normal chronically hypercapnic respiratory compensated arterial blood gas    - he is not morbidly obese and does not have obesity hypoventilation syndrome or thoracic restriction associated chronic lung disease requiring  NIV   - patient does not technically need BIPAP at this time but should be evaluated again once in chronic stable state to evaluate ventilation   - would recommend to continue current carepath including empiric therapy for CAP via antibiotics and Acute COPD exacerbation with solumedrol, nebulizer therapy and antimicrobials  -will obtain spirometry with graph overnight -patient currently on 13L/min HFNC>>5L -discussed care plan with Attending physician and RN as group.     Bibasilar atelectasis    - as seen above patient has signficant posterior basal atelectasis bilaterally     - will start recrutiment maneuvers with METAneb therapy with A- QID   -PT/OT when able    Chronic diastolic stage 2 heart failure  - patient has PRN lasix ordered , there is pulmonary interstitial edema , recommend diuresis, renal function is within reference range and patient is net positive on fluid balance     Thank you for allowing me to participate in the care of this patient.   Patient/Family are satisfied with care plan and all questions have been answered.  This document was prepared using Dragon voice recognition software and may include unintentional dictation errors.     Vida Rigger, M.D.  Division of Pulmonary & Critical Care Medicine  [facility]

## 2020-04-09 NOTE — Progress Notes (Signed)
Mobility Specialist - Progress Note   04/09/20 1600  Mobility  Activity Ambulated in hall  Level of Assistance Contact guard assist, steadying assist  Assistive Device Front wheel walker  Distance Ambulated (ft) 100 ft  Mobility Response Tolerated well  Mobility performed by Mobility specialist  $Mobility charge 1 Mobility    Pre-mobility: 72 HR, 91% SpO2 During mobility: 78 HR, 87% SpO2 Post-mobility: 70 HR, 91% SpO2   Pt was lying in bed upon arrival utilizing 5L [place] O2. Pt agreed to session. Pt denied pain, nausea, and fatigue. Pt was able to get EOB with minA, denying dizziness upon sitting. Pt's o2 desat to 85% while seated EOB. Mobility increased O2 to 6L + PLB to return sats > 90%. Pt stood to RW and voiced need to void. Pt ambulated to bathroom for urinary output. Pt completed hygiene tasks with independence. Pt ambulated 100' in room/hallway with no LOB noted. Noted pt's O2 desat to 87% with activity. PLB utilized. Upon return to room, pt pushed walker away before sitting EOB. Pt was able to reposition self to [facility]. Overall, pt tolerated session well. Pt was left in bed with all needs in reach.    Melvin Campbell Mobility Specialist 04/09/20, 4:49 PM

## 2020-04-09 NOTE — Progress Notes (Signed)
PROGRESS NOTE    Melvin Campbell  PPJ:[id number] DOB: 15-Sep-1940 DOA: 03/31/2020 PCP: Patient, No Pcp Per   Brief Narrative: This 80 years old male with PMH significant for HTN, PE with chronic hypoxic respiratory failure on 2.5 L of supplemental oxygen at baseline, cognitive impairment, early dementia, alcohol use disorder and depression presents from assisted living facility with worsening shortness of breath.  Patient has some chronic lung disease,  has been on oxygen since October 2021 because of PE.  In the ER Covid test negative,  CTA chest showed no PE but did show bibasilar atelectasis and pneumonia,  advanced emphysema.  Patient was started on antibiotics and has been improving.  Assessment & Plan:   Principal Problem:   Acute on chronic respiratory failure with hypoxia (HCC) Active Problems:   Elevated troponin   Community acquired pneumonia   Bilateral lower extremity edema  Acute on chronic hypoxic and hypercapnic respiratory failure : Patient has long history of unrecognized respiratory disease.     Patient's recent hospitalization for corneal ulcer was prolonged by hypoxia which resolved with diuresis. Since then, he has been on 2-3L O2 at ALF.   He was initially admitted for hypoxia,  CTA showed aspiration pneumonia.   He has completed 5 days antibiotics without improvement in O2. He was later started on steroids, now completed 5 days, O2 improved considerably in the last 2 days with steroids and again Lasix. His O2 requirement weaned to 5L , still desaturating with ambulation here. Transitioned Solu-Medrol to prednisone Continue Pulmicort Continue scheduled bronchodilators for now, taper to PRN at discharge Continue Metanebs, incentive spirometry and aggressive Chest PT He was given Lasix once.  BNP 172 -If SpO2 stable at <= 5lpm tomorrow, will plan for d/c back to ALF.  Elevated troponins: This could be secondary to demand ischemia. Troponins trending up,  cardiology consulted. Patient denies any chest pain.  Patient was heparinized for 48 hours. Cardiology consulted, defer left heart cath in the absence of chest pain and normal echocardiogram.   History pulmonary embolism: Continue apixaban  Hypertension: Continue HCTZ, losartan, metoprolol    Bradycardia Asymptomatic, while sleeping.   Heart rates mostly in the 50s and 60s, stable on home dose of metoprolol  Gout Denies Gout symptoms. -Continue allopurinol  Depression -Continue escitalopram  Dementia Patient has some memory impairment, this appears to be worsened by hypoxia. It seems improved.  Moderate protein calorie malnutrition As evidenced by severe chronic illness, poor p.o. intake Phos, mag stable.  -Continue Ensure   DVT prophylaxis: Eliquis Code Status: Full code. Family Communication:  No family at bed side. Disposition Plan:   Status is: Inpatient  Remains inpatient appropriate because:Inpatient level of care appropriate due to severity of illness   Dispo:  Patient From: Assisted Living Facility  Planned Disposition: Skilled Nursing Facility  Expected discharge date: 1-2 days  Medically stable for discharge: No     Consultants:    Pulmonology  Procedures: CTA chest.    Antimicrobials:   Anti-infectives (From admission, onward)   Start     Dose/Rate Route Frequency Ordered Stop   03/31/20 0500  cefTRIAXone (ROCEPHIN) 2 g in sodium chloride 0.9 % 100 mL IVPB        2 g 200 mL/hr over 30 Minutes Intravenous Every 24 hours 03/31/20 0445 04/04/20 0546   03/31/20 0500  azithromycin (ZITHROMAX) 500 mg in sodium chloride 0.9 % 250 mL IVPB        500 mg 250 mL/hr over 60 Minutes  Intravenous Every 24 hours 03/31/20 0445 04/04/20 0701      Subjective: Patient was seen and examined at bedside.  Overnight events noted.  Patient seems alert and oriented X 2.   Denies any chest pain. He is still requiring oxygen at 6 L saturation 92% which  goes down further with ambulation.  Objective: Vitals:   04/09/20 0450 04/09/20 0726 04/09/20 0738 04/09/20 1134  BP: 117/61 (!) 141/75  129/72  Pulse: (!) 50 (!) 59  65  Resp: 18 20    Temp: 97.6 F (36.4 C) (!) 97.4 F (36.3 C)  97.9 F (36.6 C)  TempSrc: Oral Oral    SpO2: 95% 90% 90% 94%  Weight:      Height:        Intake/Output Summary (Last 24 hours) at 04/09/2020 1237 Last data filed at 04/09/2020 1043 Gross per 24 hour  Intake 720 ml  Output 1225 ml  Net -505 ml   Filed Weights   04/05/20 0313 04/06/20 0202 04/07/20 0348  Weight: 81.3 kg 81.6 kg 82 kg    Examination:  General exam: Appears calm and comfortable, not in any acute distress. Respiratory system: Clear to auscultation. Respiratory effort normal. Cardiovascular system: S1 & S2 heard, RRR. No JVD, murmurs, rubs, gallops or clicks. No pedal edema. Gastrointestinal system: Abdomen is nondistended, soft and nontender. No organomegaly or masses felt. Normal bowel sounds heard. Central nervous system: Alert and oriented. No focal neurological deficits. Extremities: Left arm laceration from recent fall.  No edema, no cyanosis, no clubbing. Skin: No rashes, lesions or ulcers Psychiatry: Judgement and insight appear normal. Mood & affect appropriate.     Data Reviewed: I have personally reviewed following labs and imaging studies  CBC: Recent Labs  Lab 04/03/20 0358 04/04/20 0952 04/08/20 0515  WBC 8.1 6.2 11.7*  HGB 13.8 13.8 14.3  HCT 48.7 47.2 48.3  MCV 88.2 86.0 84.9  PLT 159 173 157   Basic Metabolic Panel: Recent Labs  Lab 04/05/20 0523 04/06/20 0554 04/07/20 0508 04/08/20 0515 04/09/20 0441  NA 140 139 137 138 138  K 3.9 3.9 4.0 3.8 3.9  CL 89* 87* 86* 86* 89*  CO2 41* 41* 40* 44* 40*  GLUCOSE 139* 93 96 88 98  BUN 36* 32* 30* 35* 29*  CREATININE 0.61 0.69 0.52* 0.65 0.49*  CALCIUM 10.0 10.1 10.0 10.0 9.5  MG 1.9 1.9 1.8 1.7 1.9  PHOS 3.9 3.4 3.4 1.8* 3.6   GFR: Estimated  Creatinine Clearance: 76.8 mL/min (A) (by C-G formula based on SCr of 0.49 mg/dL (L)). Liver Function Tests: Recent Labs  Lab 04/04/20 0952  AST 16  ALT 13  ALKPHOS 57  BILITOT 0.8  PROT 6.2*  ALBUMIN 3.1*   No results for input(s): LIPASE, AMYLASE in the last 168 hours. No results for input(s): AMMONIA in the last 168 hours. Coagulation Profile: No results for input(s): INR, PROTIME in the last 168 hours. Cardiac Enzymes: No results for input(s): CKTOTAL, CKMB, CKMBINDEX, TROPONINI in the last 168 hours. BNP (last 3 results) No results for input(s): PROBNP in the last 8760 hours. HbA1C: No results for input(s): HGBA1C in the last 72 hours. CBG: No results for input(s): GLUCAP in the last 168 hours. Lipid Profile: No results for input(s): CHOL, HDL, LDLCALC, TRIG, CHOLHDL, LDLDIRECT in the last 72 hours. Thyroid Function Tests: No results for input(s): TSH, T4TOTAL, FREET4, T3FREE, THYROIDAB in the last 72 hours. Anemia Panel: No results for input(s): VITAMINB12, FOLATE, FERRITIN, TIBC,  IRON, RETICCTPCT in the last 72 hours. Sepsis Labs: No results for input(s): PROCALCITON, LATICACIDVEN in the last 168 hours.  Recent Results (from the past 240 hour(s))  Resp Panel by RT-PCR (Flu A&B, Covid) Nasopharyngeal Swab     Status: None   Collection Time: 03/31/20  1:28 AM   Specimen: Nasopharyngeal Swab; Nasopharyngeal(NP) swabs in vial transport medium  Result Value Ref Range Status   SARS Coronavirus 2 by RT PCR NEGATIVE NEGATIVE Final    Comment: (NOTE) SARS-CoV-2 target nucleic acids are NOT DETECTED.  The SARS-CoV-2 RNA is generally detectable in upper respiratory specimens during the acute phase of infection. The lowest concentration of SARS-CoV-2 viral copies this assay can detect is 138 copies/mL. A negative result does not preclude SARS-Cov-2 infection and should not be used as the sole basis for treatment or other patient management decisions. A negative result may  occur with  improper specimen collection/handling, submission of specimen other than nasopharyngeal swab, presence of viral mutation(s) within the areas targeted by this assay, and inadequate number of viral copies(<138 copies/mL). A negative result must be combined with clinical observations, patient history, and epidemiological information. The expected result is Negative.  Fact Sheet for Patients:  BloggerCourse.com  Fact Sheet for Healthcare Providers:  SeriousBroker.it  This test is no t yet approved or cleared by the [place] FDA and  has been authorized for detection and/or diagnosis of SARS-CoV-2 by FDA under an Emergency Use Authorization (EUA). This EUA will remain  in effect (meaning this test can be used) for the duration of the COVID-19 declaration under Section 564(b)(1) of the Act, 21 U.S.C.section 360bbb-3(b)(1), unless the authorization is terminated  or revoked sooner.       Influenza A by PCR NEGATIVE NEGATIVE Final   Influenza B by PCR NEGATIVE NEGATIVE Final    Comment: (NOTE) The Xpert Xpress SARS-CoV-2/FLU/RSV plus assay is intended as an aid in the diagnosis of influenza from Nasopharyngeal swab specimens and should not be used as a sole basis for treatment. Nasal washings and aspirates are unacceptable for Xpert Xpress SARS-CoV-2/FLU/RSV testing.  Fact Sheet for Patients: BloggerCourse.com  Fact Sheet for Healthcare Providers: SeriousBroker.it  This test is not yet approved or cleared by the [place] FDA and has been authorized for detection and/or diagnosis of SARS-CoV-2 by FDA under an Emergency Use Authorization (EUA). This EUA will remain in effect (meaning this test can be used) for the duration of the COVID-19 declaration under Section 564(b)(1) of the Act, 21 U.S.C. section 360bbb-3(b)(1), unless the authorization is terminated  or revoked.  Performed at [facility], [address] [place], [place] [address]   Culture, blood (routine x 2)     Status: None   Collection Time: 03/31/20  1:54 AM   Specimen: BLOOD  Result Value Ref Range Status   Specimen Description BLOOD RIGHT ANTECUBITAL  Final   Special Requests   Final    BOTTLES DRAWN AEROBIC AND ANAEROBIC Blood Culture adequate volume   Culture   Final    NO GROWTH 5 DAYS Performed at [facility], [address] [place], [place] [address]    Report Status 04/05/2020 FINAL  Final  Culture, blood (routine x 2)     Status: None   Collection Time: 03/31/20  1:55 AM   Specimen: BLOOD  Result Value Ref Range Status   Specimen Description BLOOD RIGHT ANTECUBITAL  Final   Special Requests   Final    BOTTLES DRAWN AEROBIC AND ANAEROBIC  Blood Culture adequate volume   Culture   Final    NO GROWTH 5 DAYS Performed at [facility], [address] [place], [place] [address]    Report Status 04/05/2020 FINAL  Final  MRSA PCR Screening     Status: None   Collection Time: 04/01/20  4:00 AM   Specimen: Nasopharyngeal  Result Value Ref Range Status   MRSA by PCR NEGATIVE NEGATIVE Final    Comment:        The GeneXpert MRSA Assay (FDA approved for NASAL specimens only), is one component of a comprehensive MRSA colonization surveillance program. It is not intended to diagnose MRSA infection nor to guide or monitor treatment for MRSA infections. Performed at [facility], [address] [place], [place] [address]    Radiology Studies: No results found.  Scheduled Meds: . acetylcysteine  2 mL Nebulization BID  . allopurinol  300 mg Oral Daily  . apixaban  5 mg Oral BID  . aspirin EC  81 mg Oral Daily  . atorvastatin  80 mg Oral Daily  . budesonide (PULMICORT) nebulizer solution  0.25 mg Nebulization BID  . erythromycin  1 application Right Eye TID  . escitalopram  20 mg Oral Daily  . feeding supplement   237 mL Oral BID BM  . folic acid  1 mg Oral Daily  . guaiFENesin  600 mg Oral BID  . hydrochlorothiazide  25 mg Oral Daily  . influenza vaccine adjuvanted  0.5 mL Intramuscular Tomorrow-1000  . ipratropium-albuterol  3 mL Nebulization BID  . losartan  100 mg Oral Daily  . magnesium oxide  400 mg Oral BID  . mouth rinse  15 mL Mouth Rinse BID  . metoprolol succinate  75 mg Oral Daily  . multivitamin with minerals  1 tablet Oral Daily  . phosphorus  250 mg Oral TID  . polyvinyl alcohol  1 drop Right Eye Q6H  . prednisoLONE acetate  1 drop Right Eye BID  . thiamine  250 mg Oral Daily   Continuous Infusions: . sodium chloride 250 mL (04/01/20 0520)     LOS: 9 days    Time spent: 25 mins.    Cipriano BunkerPARDEEP KUMAR, MD [facility]   If 7PM-7AM, please contact night-coverage

## 2020-04-10 ENCOUNTER — Inpatient Hospital Stay: Payer: Medicare Other

## 2020-04-10 LAB — MAGNESIUM: Magnesium: 2 mg/dL (ref 1.7–2.4)

## 2020-04-10 LAB — PHOSPHORUS: Phosphorus: 3.2 mg/dL (ref 2.5–4.6)

## 2020-04-10 NOTE — Progress Notes (Signed)
Pulmonary Medicine          Date: 04/10/2020,   MRN# [id number] Melvin Campbell 1940-08-10     AdmissionWeight: 84.8 kg                 CurrentWeight: 82 kg   Referring physician: Dr Maryfrances Bunnell   CHIEF COMPLAINT:   Chronic hypercapnic and hypoxemic respiratory failure   HISTORY OF PRESENT ILLNESS   Melvin Campbell is a 80 y.o. male with medical history significant for hypertension, dyslipidemia and PE with chronic respiratory failure on home O2 at 2.5 L/min by nasal cannula, and dementia who presented to the emergency room with acute onset of worsening dyspnea at his skilled nursing facility with associated hypoxemia with a pulse oximetry of 87% on his O2 at 2.5 L and wheezing.  Pulse oximetry improved to 93% on 4 L of O2.  He denied chest pain nausea vomiting or diaphoresis or palpitations.  He admited to chronic lower extremity edema.  No fever or chills.  No bleeding diathesis.  No dysuria, oliguria or hematuria or flank pain.   Labs revealed a BUN of 39 creatinine 0.74, BNP of 23.2 and high-sensitivity troponin of 1263 with lactic acid of 1 and CBC levels unremarkable.  Influenza antigens and COVID-19 PCR came back negative.  Blood cultures were drawn.  INR is 1.4 and PT 16.8 with PTT 45. Chest CTA revealed no PE.  Showed cardiomegaly and dilated main pulmonary artery consistent with pulmonary artery hypertension by basal independent right middle lobe consolidation with volume loss likely due to atelectasis and differential diagnosis would include pneumonia.  Aortic atherosclerosis tortuosity and moderate emphysema. Patient has been on BIPAP and has had recurrent hypercanpnia  04/04/20- patient is stable, he is speaking in full sentences. HFNC weaned to 12 and can be further weaned. Plan to continue current care.  His steroids are being weaned.   04/09/20- patient significantly improved now to 5L/min [place].   04/10/20- patient is improved, he is down to 3L/min [place].  He is speaking  in full sentences and is not with shortness of breath. He is participating with PT/OT/CPT.   Interval CXR today.    PAST MEDICAL HISTORY   History reviewed. No pertinent past medical history.   SURGICAL HISTORY   No available surgical data available at this time  FAMILY HISTORY   No family history on file.   SOCIAL HISTORY   Social History   Tobacco Use  . Smoking status: Never Smoker  . Smokeless tobacco: Never Used     MEDICATIONS    Home Medication:    Current Medication:  Current Facility-Administered Medications:  .  0.9 %  sodium chloride infusion, , Intravenous, PRN, Mansy, Jan A, MD, Last Rate: 4 mL/hr at 04/01/20 0520, 250 mL at 04/01/20 0520 .  acetaminophen (TYLENOL) tablet 650 mg, 650 mg, Oral, Q4H PRN, Mansy, Jan A, MD, 650 mg at 03/31/20 0742 .  acetylcysteine (MUCOMYST) 20 % nebulizer / oral solution 2 mL, 2 mL, Nebulization, BID, Danford, Earl Lites, MD, 2 mL at 04/10/20 0747 .  albuterol (PROVENTIL) (2.5 MG/3ML) 0.083% nebulizer solution 2.5 mg, 2.5 mg, Nebulization, Q2H PRN, Danford, Earl Lites, MD .  allopurinol (ZYLOPRIM) tablet 300 mg, 300 mg, Oral, Daily, Mansy, Jan A, MD, 300 mg at 04/10/20 0954 .  ALPRAZolam Prudy Feeler) tablet 0.25 mg, 0.25 mg, Oral, BID PRN, Mansy, Jan A, MD, 0.25 mg at 04/09/20 1833 .  apixaban (ELIQUIS) tablet 5 mg, 5 mg, Oral, BID,  Ronnald Ramp, RPH, 5 mg at 04/10/20 3532 .  aspirin EC tablet 81 mg, 81 mg, Oral, Daily, Leanora Ivanoff, PA-C, 81 mg at 04/10/20 9924 .  atorvastatin (LIPITOR) tablet 80 mg, 80 mg, Oral, Daily, Mansy, Jan A, MD, 80 mg at 04/10/20 0953 .  budesonide (PULMICORT) nebulizer solution 0.25 mg, 0.25 mg, Nebulization, BID, Esaw Grandchild A, DO, 0.25 mg at 04/10/20 0748 .  erythromycin ophthalmic ointment 1 application, 1 application, Right Eye, TID, Mansy, Vernetta Honey, MD, 1 application at 04/10/20 1006 .  escitalopram (LEXAPRO) tablet 20 mg, 20 mg, Oral, Daily, Mansy, Jan A, MD, 20 mg at 04/10/20 2683 .   feeding supplement (ENSURE ENLIVE / ENSURE PLUS) liquid 237 mL, 237 mL, Oral, BID BM, Danford, Earl Lites, MD, 237 mL at 04/10/20 1107 .  folic acid (FOLVITE) tablet 1 mg, 1 mg, Oral, Daily, Mansy, Jan A, MD, 1 mg at 04/10/20 1002 .  furosemide (LASIX) tablet 20 mg, 20 mg, Oral, Daily PRN, Mansy, Jan A, MD, 20 mg at 03/31/20 0742 .  guaiFENesin (MUCINEX) 12 hr tablet 600 mg, 600 mg, Oral, BID, Mansy, Jan A, MD, 600 mg at 04/10/20 1003 .  hydrochlorothiazide (HYDRODIURIL) tablet 25 mg, 25 mg, Oral, Daily, Mansy, Jan A, MD, 25 mg at 04/10/20 1004 .  influenza vaccine adjuvanted (FLUAD) injection 0.5 mL, 0.5 mL, Intramuscular, Tomorrow-1000, Danford, Christopher P, MD .  ipratropium-albuterol (DUONEB) 0.5-2.5 (3) MG/3ML nebulizer solution 3 mL, 3 mL, Nebulization, BID, Danford, Earl Lites, MD, 3 mL at 04/10/20 0748 .  losartan (COZAAR) tablet 100 mg, 100 mg, Oral, Daily, Mansy, Jan A, MD, 100 mg at 04/10/20 0953 .  magnesium oxide (MAG-OX) tablet 400 mg, 400 mg, Oral, BID, Mansy, Jan A, MD, 400 mg at 04/10/20 0955 .  MEDLINE mouth rinse, 15 mL, Mouth Rinse, BID, Mansy, Jan A, MD, 15 mL at 04/10/20 1004 .  metoprolol succinate (TOPROL-XL) 24 hr tablet 75 mg, 75 mg, Oral, Daily, Mansy, Jan A, MD, 75 mg at 04/10/20 0956 .  multivitamin with minerals tablet 1 tablet, 1 tablet, Oral, Daily, Mansy, Jan A, MD, 1 tablet at 04/10/20 0957 .  nitroGLYCERIN (NITROSTAT) SL tablet 0.4 mg, 0.4 mg, Sublingual, Q5 Min x 3 PRN, Mansy, Jan A, MD .  ondansetron [facility]) injection 4 mg, 4 mg, Intravenous, Q6H PRN, Mansy, Jan A, MD .  polyvinyl alcohol (LIQUIFILM TEARS) 1.4 % ophthalmic solution 1 drop, 1 drop, Right Eye, Q6H, Mansy, Jan A, MD, 1 drop at 04/10/20 1007 .  prednisoLONE acetate (PRED FORTE) 1 % ophthalmic suspension 1 drop, 1 drop, Right Eye, BID, Mansy, Jan A, MD, 1 drop at 04/10/20 1005 .  thiamine tablet 250 mg, 250 mg, Oral, Daily, Esaw Grandchild A, DO, 250 mg at 04/10/20 4196 .  zolpidem (AMBIEN)  tablet 5 mg, 5 mg, Oral, QHS PRN, Mansy, Jan A, MD, 5 mg at 04/06/20 2108    ALLERGIES   Amlodipine     REVIEW OF SYSTEMS    Review of Systems:  Gen:  Denies  fever, sweats, chills weigh loss  HEENT: Denies blurred vision, double vision, ear pain, eye pain, hearing loss, nose bleeds, sore throat Cardiac:  No dizziness, chest pain or heaviness, chest tightness,edema Resp:   Denies cough or sputum porduction, shortness of breath,wheezing, hemoptysis,  Gi: Denies swallowing difficulty, stomach pain, nausea or vomiting, diarrhea, constipation, bowel incontinence Gu:  Denies bladder incontinence, burning urine Ext:   Denies Joint pain, stiffness or swelling Skin: Denies  skin rash, easy  bruising or bleeding or hives Endoc:  Denies polyuria, polydipsia , polyphagia or weight change Psych:   Denies depression, insomnia or hallucinations   Other:  All other systems negative   VS: BP 135/72 (BP Location: Right Arm)   Pulse (!) 47   Temp 98.1 F (36.7 C) (Oral)   Resp 20   Ht 5\' 7"  (1.702 m)   Wt 82 kg   SpO2 93%   BMI 28.32 kg/m      PHYSICAL EXAM    GENERAL:NAD, no fevers, chills, no weakness no fatigue HEAD: Normocephalic, atraumatic.  EYES: Pupils equal, round, reactive to light. Extraocular muscles intact. No scleral icterus.  MOUTH: Moist mucosal membrane. Dentition intact. No abscess noted.  EAR, NOSE, THROAT: Clear without exudates. No external lesions.  NECK: Supple. No thyromegaly. No nodules. No JVD.  PULMONARY: crackles at bases bilaterally  CARDIOVASCULAR: S1 and S2. Regular rate and rhythm. No murmurs, rubs, or gallops. No edema. Pedal pulses 2+ bilaterally.  GASTROINTESTINAL: Soft, nontender, nondistended. No masses. Positive bowel sounds. No hepatosplenomegaly.  MUSCULOSKELETAL: No swelling, clubbing, or edema. Range of motion full in all extremities.  NEUROLOGIC: Cranial nerves II through XII are intact. No gross focal neurological deficits. Sensation  intact. Reflexes intact.  SKIN: No ulceration, lesions, rashes, or cyanosis. Skin warm and dry. Turgor intact.  PSYCHIATRIC: Mood, affect within normal limits. The patient is awake, alert and oriented x 3. Insight, judgment intact.       IMAGING    DG Chest 2 View  Result Date: 03/31/2020 CLINICAL DATA:  Shortness of breath. EXAM: CHEST - 2 VIEW COMPARISON:  None available. FINDINGS: Low lung volumes limit assessment. Heart size upper normal. Aortic atherosclerosis and tortuosity. There are streaky bibasilar opacities. Mild diffuse peribronchial thickening. No significant pleural effusion. No pneumothorax. Degenerative change throughout the spine. Right shoulder arthroplasty. No acute osseous abnormalities are seen. IMPRESSION: 1. Low lung volumes with streaky bibasilar opacities, favoring atelectasis. Mild diffuse peribronchial thickening. 2. Diffuse aortic atherosclerosis. Aortic tortuosity. Aortic Atherosclerosis (ICD10-I70.0). Electronically Signed   By: 04/02/2020 M.D.   On: 03/31/2020 01:42   CT Angio Chest PE W/Cm &/Or Wo Cm  Result Date: 03/31/2020 CLINICAL DATA:  PE suspected, high prob PE in 01/2020; hypoxic EXAM: CT ANGIOGRAPHY CHEST WITH CONTRAST TECHNIQUE: Multidetector CT imaging of the chest was performed using the standard protocol during bolus administration of intravenous contrast. Multiplanar CT image reconstructions and MIPs were obtained to evaluate the vascular anatomy. CONTRAST:  02/2020 OMNIPAQUE IOHEXOL 350 MG/ML SOLN COMPARISON:  Chest radiograph earlier today.  No prior CT available. FINDINGS: Cardiovascular: No evidence of acute or chronic pulmonary embolus. There are no intraluminal pulmonary arterial filling defects. No intraluminal webs. Dilated main pulmonary artery at 4 cm. Multi chamber cardiomegaly. Coronary artery calcifications. Aortic atherosclerosis and tortuosity. Cannot assess for dissection given phase of contrast tailored to pulmonary artery evaluation.  No pericardial effusion. Mediastinum/Nodes: Prominent right lower paratracheal node measures 10 mm. 10 mm left hilar node. No visualized thyroid nodule. Patulous esophagus. Lungs/Pleura: Bibasilar and dependent right middle lobe consolidation. Bibasilar volume loss. Moderate emphysema. No septal thickening or pulmonary edema no significant pleural effusion. Upper Abdomen: Left perinephric edema is partially included, nonspecific. Lobulated splenic contours. Musculoskeletal: Diffuse thoracic spondylosis. Subacute or remote lower lateral left rib fractures with callus formation. Right shoulder arthroplasty. Review of the MIP images confirms the above findings. IMPRESSION: 1. No pulmonary embolus. Cardiomegaly. Dilated main pulmonary artery consistent with pulmonary arterial hypertension. 2. Bibasilar and dependent right middle  lobe consolidation with volume loss. Atelectasis is favored, however sterility is indeterminate by imaging, recommend correlation for pneumonia symptoms. 3. Nonspecific left perinephric edema in the upper abdomen is partially included, may be chronic or seen with urinary tract infection. 4. Aortic atherosclerosis and tortuosity. 5. Moderate emphysema Aortic Atherosclerosis (ICD10-I70.0) and Emphysema (ICD10-J43.9). Electronically Signed   By: Narda RutherfordMelanie  Sanford M.D.   On: 03/31/2020 03:43   DG Chest Port 1 View  Result Date: 04/03/2020 CLINICAL DATA:  Respiratory failure with hypoxia EXAM: PORTABLE CHEST 1 VIEW COMPARISON:  March 31, 2020 chest radiograph and chest CT FINDINGS: There is underlying parenchymal fibrotic type change. There are areas of scattered atelectatic change. There is no frank edema or consolidation. Heart is upper normal in size with pulmonary vascularity normal. No adenopathy. There is aortic atherosclerosis. 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 There is right carotid artery calcification. IMPRESSION: Underlying fibrosis and scattered areas of  atelectasis. No edema or airspace opacity. Stable cardiac silhouette. Aortic Atherosclerosis (ICD10-I70.0). There is right carotid artery calcification. Electronically Signed   By: Bretta BangWilliam  Woodruff III M.D.   On: 04/03/2020 12:19   ECHOCARDIOGRAM COMPLETE  Result Date: 04/01/2020    ECHOCARDIOGRAM REPORT   Patient Name:   Waldo LaineHILIP Aramburo Date of Exam: 04/01/2020 Medical Rec #:  [id number]        Height:       67.0 in Accession #:    [id number]       Weight:       187.0 lb Date of Birth:  04/22/1940         BSA:          1.965 m Patient Age:    79 years         BP:           125/61 mmHg Patient Gender: M                HR:           58 bpm. Exam Location:  [facility] Procedure: 2D Echo, Cardiac Doppler and Color Doppler Indications:     NSTEMI I21.4  History:         Patient has no prior history of Echocardiogram examinations. No                  medical history on file.  Sonographer:     Cristela BlueJerry Hege RDCS (AE) Referring Phys:  [id number] Vernetta HoneyJAN A MANSY Diagnosing Phys: Lorine BearsMuhammad Arida MD  Sonographer Comments: Suboptimal apical window. IMPRESSIONS  1. Left ventricular ejection fraction, by estimation, is 60 to 65%. The left ventricle has normal function. The left ventricle has no regional wall motion abnormalities. There is moderate left ventricular hypertrophy. Left ventricular diastolic parameters are consistent with Grade II diastolic dysfunction (pseudonormalization).  2. Right ventricular systolic function is normal. The right ventricular size is normal. Tricuspid regurgitation signal is inadequate for assessing PA pressure.  3. Left atrial size was mildly dilated.  4. The mitral valve is normal in structure. No evidence of mitral valve regurgitation. No evidence of mitral stenosis. Moderate mitral annular calcification.  5. The aortic valve is normal in structure. Aortic valve regurgitation is not visualized. Mild aortic valve stenosis.  6. Aortic dilatation noted. There is mild dilatation of the ascending aorta,  measuring 41 mm.  7. The inferior vena cava is dilated in size with <50% respiratory variability, suggesting right atrial pressure of 15 mmHg. FINDINGS  Left Ventricle: Left ventricular ejection fraction, by  estimation, is 60 to 65%. The left ventricle has normal function. The left ventricle has no regional wall motion abnormalities. The left ventricular internal cavity size was normal in size. There is  moderate left ventricular hypertrophy. Left ventricular diastolic parameters are consistent with Grade II diastolic dysfunction (pseudonormalization). Right Ventricle: The right ventricular size is normal. No increase in right ventricular wall thickness. Right ventricular systolic function is normal. Tricuspid regurgitation signal is inadequate for assessing PA pressure. Left Atrium: Left atrial size was mildly dilated. Right Atrium: Right atrial size was normal in size. Pericardium: There is no evidence of pericardial effusion. Mitral Valve: The mitral valve is normal in structure. Moderate mitral annular calcification. No evidence of mitral valve regurgitation. No evidence of mitral valve stenosis. Tricuspid Valve: The tricuspid valve is normal in structure. Tricuspid valve regurgitation is not demonstrated. No evidence of tricuspid stenosis. Aortic Valve: The aortic valve is normal in structure. Aortic valve regurgitation is not visualized. Mild aortic stenosis is present. Aortic valve mean gradient measures 4.5 mmHg. Aortic valve peak gradient measures 7.7 mmHg. Aortic valve area, by VTI measures 2.13 cm. Pulmonic Valve: The pulmonic valve was normal in structure. Pulmonic valve regurgitation is not visualized. No evidence of pulmonic stenosis. Aorta: The aortic root is normal in size and structure and aortic dilatation noted. There is mild dilatation of the ascending aorta, measuring 41 mm. Venous: The inferior vena cava is dilated in size with less than 50% respiratory variability, suggesting right atrial  pressure of 15 mmHg. IAS/Shunts: No atrial level shunt detected by color flow Doppler.  LEFT VENTRICLE PLAX 2D LVIDd:         4.14 cm  Diastology LVIDs:         2.69 cm  LV e' medial:    4.24 cm/s LV PW:         1.14 cm  LV E/e' medial:  22.6 LV IVS:        1.16 cm  LV e' lateral:   4.57 cm/s LVOT diam:     2.20 cm  LV E/e' lateral: 21.0 LV SV:         73 LV SV Index:   37 LVOT Area:     3.80 cm  RIGHT VENTRICLE RV Basal diam:  3.37 cm RV S prime:     9.46 cm/s TAPSE (M-mode): 3.8 cm LEFT ATRIUM             Index       RIGHT ATRIUM           Index LA diam:        3.90 cm 1.98 cm/m  RA Area:     10.60 cm LA Vol (A2C):   78.4 ml 39.89 ml/m RA Volume:   20.70 ml  10.53 ml/m LA Vol (A4C):   70.5 ml 35.87 ml/m LA Biplane Vol: 79.9 ml 40.66 ml/m  AORTIC VALVE                    PULMONIC VALVE AV Area (Vmax):    2.13 cm     PV Vmax:        0.69 m/s AV Area (Vmean):   2.18 cm     PV Peak grad:   1.9 mmHg AV Area (VTI):     2.13 cm     RVOT Peak grad: 2 mmHg AV Vmax:           138.50 cm/s AV Vmean:  101.500 cm/s AV VTI:            0.342 m AV Peak Grad:      7.7 mmHg AV Mean Grad:      4.5 mmHg LVOT Vmax:         77.70 cm/s LVOT Vmean:        58.300 cm/s LVOT VTI:          0.192 m LVOT/AV VTI ratio: 0.56  AORTA Ao Root diam: 4.00 cm MITRAL VALVE                TRICUSPID VALVE MV Area (PHT): 3.95 cm     TR Peak grad:   8.4 mmHg MV Decel Time: 192 msec     TR Vmax:        145.00 cm/s MV E velocity: 96.00 cm/s MV A velocity: 113.00 cm/s  SHUNTS MV E/A ratio:  0.85         Systemic VTI:  0.19 m                             Systemic Diam: 2.20 cm Lorine Bears MD Electronically signed by Lorine Bears MD Signature Date/Time: 04/01/2020/10:36:14 AM    Final            ASSESSMENT/PLAN   Acute on chronic hypercapnic hypoxemic respiratory failure   - patient has borderline normal chronically hypercapnic respiratory compensated arterial blood gas    - he is not morbidly obese and does not have obesity  hypoventilation syndrome or thoracic restriction associated chronic lung disease requiring NIV   - patient does not technically need BIPAP at this time but should be evaluated again once in chronic stable state to evaluate ventilation   - would recommend to continue current carepath including empiric therapy for CAP via antibiotics and Acute COPD exacerbation with solumedrol, nebulizer therapy and antimicrobials  -will obtain spirometry with graph overnight -patient currently on 13L/min HFNC>>5L>>>3L/min  -discussed care plan with Attending physician and RN as group.     Bibasilar atelectasis    - as seen above patient has signficant posterior basal atelectasis bilaterally     - will start recrutiment maneuvers with METAneb therapy with A- QID   -PT/OT continue     Chronic diastolic stage 2 heart failure  - patient has PRN lasix ordered , there is pulmonary interstitial edema , recommend diuresis, renal function is within reference range and patient is net positive on fluid balance     Thank you for allowing me to participate in the care of this patient.   Patient/Family are satisfied with care plan and all questions have been answered.  This document was prepared using Dragon voice recognition software and may include unintentional dictation errors.     Vida Rigger, M.D.  Division of Pulmonary & Critical Care Medicine  [facility]

## 2020-04-10 NOTE — Progress Notes (Signed)
PT Cancellation Note  Patient Details Name: Melvin Campbell MRN: [id number] DOB: September 30, 1940   Cancelled Treatment:     PT attempt. Pt refused at this time." I'm too tired right now. Can you come back in the morning? " Acute PT will continue to follow and progress as able per POC.    Rushie Chestnut 04/10/2020, 1:13 PM

## 2020-04-10 NOTE — Progress Notes (Signed)
SATURATION QUALIFICATIONS: (This note is used to comply with regulatory documentation for home oxygen)  Patient Saturations on Room Air at Rest = ***%  Patient Saturations on Room Air while Ambulating = ***%  Patient Saturations on *** Liters of oxygen while Ambulating = ***%  Please briefly explain why patient needs home oxygen: 

## 2020-04-10 NOTE — TOC Progression Note (Signed)
Transition of Care [facility]) - Progression Note    Patient Details  Name: Melvin Campbell MRN: [id number] Date of Birth: [birth year]  Transition of Care [facility]) CM/SW Contact  Chapman Fitch, RN Phone Number: 04/10/2020, 12:07 PM  Clinical Narrative:     Notified by Elease Hashimoto at [employer] that they do not provide O2 for the patient I have left 2 voice mails for Keda/dustin at the [facility] to determine who provides the O2, and if the patient can return today  I have also reached out to [facility] and [employer] to determine if they provide O2        Expected Discharge Plan and Services                                                 Social Determinants of Health (SDOH) Interventions    Readmission Risk Interventions No flowsheet data found.

## 2020-04-10 NOTE — Progress Notes (Signed)
Mobility Specialist - Progress Note   04/10/20 1500  Mobility  Activity Contraindicated/medical hold  Mobility performed by Mobility specialist    Pt with medical issues prohibiting mobility this date. Pt lying in bed upon arrival as VS are monitored prior to activity. Pt with low HR of 43-46 bpm this date. Per discussion with nurse, request to hold and attempt when medically appropriate.    Melvin Campbell Mobility Specialist 04/10/20, 3:17 PM

## 2020-04-10 NOTE — Progress Notes (Signed)
Pt is bradycardic since am. No signs of acute distress. MD was made aware.

## 2020-04-10 NOTE — Progress Notes (Addendum)
PROGRESS NOTE    Melvin Campbell  IEP:[id number] DOB: 1940/04/19 DOA: 03/31/2020 PCP: Patient, No Pcp Per   Brief Narrative: This 80 years old male with PMH significant for HTN, PE with chronic hypoxic respiratory failure on 2.5 L of supplemental oxygen at baseline, cognitive impairment, early dementia, alcohol use disorder and depression presents from assisted living facility with worsening shortness of breath.  Patient has some chronic lung disease,  has been on oxygen since October 2021 because of PE.  In the ER Covid test negative,  CTA chest showed no PE but did show bibasilar atelectasis and pneumonia,  advanced emphysema.  Patient was started on antibiotics and has been improving.  Assessment & Plan:   Principal Problem:   Acute on chronic respiratory failure with hypoxia (HCC) Active Problems:   Elevated troponin   Community acquired pneumonia   Bilateral lower extremity edema  Acute on chronic hypoxic and hypercapnic respiratory failure : Patient has long history of unrecognized respiratory disease.     Patient's recent hospitalization for corneal ulcer was prolonged by hypoxia which resolved with diuresis. Since then, he has been on 2-3L O2 at ALF.   He was initially admitted for hypoxia,  CTA showed aspiration pneumonia.   He has completed 5 days antibiotics without improvement in O2. He was later started on steroids, now completed 5 days, O2 improved considerably in the last 2 days with steroids and again Lasix. His O2 requirement weaned to 5L , still desaturating with ambulation here. Transitioned Solu-Medrol to prednisone Continue Pulmicort Continue scheduled bronchodilators for now, taper to PRN at discharge Continue Metanebs, incentive spirometry and aggressive Chest PT He was given Lasix once.  BNP 172 -If SpO2 stable at <= 5lpm tomorrow, will plan for d/c back to ALF. - Patient needs O2 concentrator with higher O2 flow.  Elevated troponins: This could be  secondary to demand ischemia. Troponins trending up, cardiology consulted. Patient denies any chest pain.  Patient was heparinized for 48 hours. Cardiology consulted, defer left heart cath in the absence of chest pain and normal echocardiogram.  History pulmonary embolism: Continue apixaban  Hypertension: Continue HCTZ, losartan, metoprolol    Bradycardia Asymptomatic, while sleeping.   Heart rates mostly in the 50s and 60s, stable on home dose of metoprolol  Gout Denies Gout symptoms. -Continue allopurinol  Depression -Continue escitalopram  Dementia Patient has some memory impairment, this appears to be worsened by hypoxia. It seems improved.  Moderate protein calorie malnutrition As evidenced by severe chronic illness, poor p.o. intake Phos, mag stable.  -Continue Ensure   DVT prophylaxis: Eliquis Code Status: Full code. Family Communication:  No family at bed side. Disposition Plan:   Status is: Inpatient  Remains inpatient appropriate because:Inpatient level of care appropriate due to severity of illness   Dispo:  Patient From: Assisted Living Facility  Planned Disposition: Skilled Nursing Facility  Expected discharge date: 1-2 days  Medically stable for discharge: No    Patient needs oxygen concentrator with high flow oxygen.   Consultants:    Pulmonology  Procedures: CTA chest.    Antimicrobials:   Anti-infectives (From admission, onward)   Start     Dose/Rate Route Frequency Ordered Stop   03/31/20 0500  cefTRIAXone (ROCEPHIN) 2 g in sodium chloride 0.9 % 100 mL IVPB        2 g 200 mL/hr over 30 Minutes Intravenous Every 24 hours 03/31/20 0445 04/04/20 0546   03/31/20 0500  azithromycin (ZITHROMAX) 500 mg in sodium chloride 0.9 %  250 mL IVPB        500 mg 250 mL/hr over 60 Minutes Intravenous Every 24 hours 03/31/20 0445 04/04/20 0701      Subjective: Patient was seen and examined at bedside.  Overnight events noted.  Patient seems  alert and oriented X 3.   Denies any chest pain. He is weaned down to 2.5 L of supplemental oxygen at rest to keep saturation above 92%.   Objective: Vitals:   04/10/20 0742 04/10/20 0953 04/10/20 1130 04/10/20 1310  BP: (!) 154/77  135/72   Pulse: (!) 50  (!) 47 (!) 49  Resp: 19  20   Temp: 98 F (36.7 C)  98.1 F (36.7 C)   TempSrc: Oral  Oral   SpO2: 96% 91% 93% 90%  Weight:      Height:        Intake/Output Summary (Last 24 hours) at 04/10/2020 1423 Last data filed at 04/10/2020 1320 Gross per 24 hour  Intake 240 ml  Output 775 ml  Net -535 ml   Filed Weights   04/05/20 0313 04/06/20 0202 04/07/20 0348  Weight: 81.3 kg 81.6 kg 82 kg    Examination:  General exam: Appears calm and comfortable, not in any acute distress. Respiratory system: Clear to auscultation. Respiratory effort normal. Cardiovascular system: S1 & S2 heard, RRR. No JVD, murmurs, rubs, gallops or clicks. No pedal edema. Gastrointestinal system: Abdomen is nondistended, soft and nontender. No organomegaly or masses felt. Normal bowel sounds heard. Central nervous system: Alert and oriented. No focal neurological deficits. Extremities: Left arm laceration from recent fall.  No edema, no cyanosis, no clubbing. Skin: No rashes, lesions or ulcers Psychiatry: Judgement and insight appear normal. Mood & affect appropriate.     Data Reviewed: I have personally reviewed following labs and imaging studies  CBC: Recent Labs  Lab 04/04/20 0952 04/08/20 0515  WBC 6.2 11.7*  HGB 13.8 14.3  HCT 47.2 48.3  MCV 86.0 84.9  PLT 173 157   Basic Metabolic Panel: Recent Labs  Lab 04/05/20 0523 04/06/20 0554 04/07/20 0508 04/08/20 0515 04/09/20 0441 04/10/20 0400  NA 140 139 137 138 138  --   K 3.9 3.9 4.0 3.8 3.9  --   CL 89* 87* 86* 86* 89*  --   CO2 41* 41* 40* 44* 40*  --   GLUCOSE 139* 93 96 88 98  --   BUN 36* 32* 30* 35* 29*  --   CREATININE 0.61 0.69 0.52* 0.65 0.49*  --   CALCIUM 10.0 10.1  10.0 10.0 9.5  --   MG 1.9 1.9 1.8 1.7 1.9 2.0  PHOS 3.9 3.4 3.4 1.8* 3.6 3.2   GFR: Estimated Creatinine Clearance: 76.8 mL/min (A) (by C-G formula based on SCr of 0.49 mg/dL (L)). Liver Function Tests: Recent Labs  Lab 04/04/20 0952  AST 16  ALT 13  ALKPHOS 57  BILITOT 0.8  PROT 6.2*  ALBUMIN 3.1*   No results for input(s): LIPASE, AMYLASE in the last 168 hours. No results for input(s): AMMONIA in the last 168 hours. Coagulation Profile: No results for input(s): INR, PROTIME in the last 168 hours. Cardiac Enzymes: No results for input(s): CKTOTAL, CKMB, CKMBINDEX, TROPONINI in the last 168 hours. BNP (last 3 results) No results for input(s): PROBNP in the last 8760 hours. HbA1C: No results for input(s): HGBA1C in the last 72 hours. CBG: No results for input(s): GLUCAP in the last 168 hours. Lipid Profile: No results for input(s): CHOL,  HDL, LDLCALC, TRIG, CHOLHDL, LDLDIRECT in the last 72 hours. Thyroid Function Tests: No results for input(s): TSH, T4TOTAL, FREET4, T3FREE, THYROIDAB in the last 72 hours. Anemia Panel: No results for input(s): VITAMINB12, FOLATE, FERRITIN, TIBC, IRON, RETICCTPCT in the last 72 hours. Sepsis Labs: No results for input(s): PROCALCITON, LATICACIDVEN in the last 168 hours.  Recent Results (from the past 240 hour(s))  MRSA PCR Screening     Status: None   Collection Time: 04/01/20  4:00 AM   Specimen: Nasopharyngeal  Result Value Ref Range Status   MRSA by PCR NEGATIVE NEGATIVE Final    Comment:        The GeneXpert MRSA Assay (FDA approved for NASAL specimens only), is one component of a comprehensive MRSA colonization surveillance program. It is not intended to diagnose MRSA infection nor to guide or monitor treatment for MRSA infections. Performed at [facility], [address], [place], [place] [address]    Radiology Studies: [facility] Chest [place] 1 View  Result Date: 04/10/2020 CLINICAL DATA:  Acute on chronic  respiratory failure with hypoxia. EXAM: PORTABLE CHEST 1 VIEW COMPARISON:  Chest radiograph April 03, 2020. CT chest March 31, 2020. FINDINGS: Low lung volumes. Elevated left hemidiaphragm. Similar left greater than right basilar opacities, better characterized on prior CT chest. No new consolidation. No visible pneumothorax. No sizable pleural effusions. Left costophrenic sulcus blunting is favored to relate to left lower lobe opacity (and not a pleural effusion) when correlating with prior CT chest. Cardiomediastinal silhouette is enlarged and accentuated by low lung volumes and portable AP technique. No acute osseous abnormality. Right reverse shoulder arthroplasty. IMPRESSION: 1. Similar appearance to priors with low lung volumes and left greater than right basilar opacities which are favored to represent atelectasis, although infection is not excluded. 2. Cardiomegaly. Electronically Signed   By: Feliberto Harts MD   On: 04/10/2020 13:35    Scheduled Meds: . acetylcysteine  2 mL Nebulization BID  . allopurinol  300 mg Oral Daily  . apixaban  5 mg Oral BID  . aspirin EC  81 mg Oral Daily  . atorvastatin  80 mg Oral Daily  . budesonide (PULMICORT) nebulizer solution  0.25 mg Nebulization BID  . erythromycin  1 application Right Eye TID  . escitalopram  20 mg Oral Daily  . feeding supplement  237 mL Oral BID BM  . folic acid  1 mg Oral Daily  . guaiFENesin  600 mg Oral BID  . hydrochlorothiazide  25 mg Oral Daily  . influenza vaccine adjuvanted  0.5 mL Intramuscular Tomorrow-1000  . ipratropium-albuterol  3 mL Nebulization BID  . losartan  100 mg Oral Daily  . magnesium oxide  400 mg Oral BID  . mouth rinse  15 mL Mouth Rinse BID  . metoprolol succinate  75 mg Oral Daily  . multivitamin with minerals  1 tablet Oral Daily  . polyvinyl alcohol  1 drop Right Eye Q6H  . prednisoLONE acetate  1 drop Right Eye BID  . thiamine  250 mg Oral Daily   Continuous Infusions: . sodium  chloride 250 mL (04/01/20 0520)     LOS: 10 days    Time spent: 25 mins.    Cipriano Bunker, MD [facility]   If 7PM-7AM, please contact night-coverage

## 2020-04-11 LAB — BASIC METABOLIC PANEL
Anion gap: 8 (ref 5–15)
BUN: 27 mg/dL — ABNORMAL HIGH (ref 8–23)
CO2: 38 mmol/L — ABNORMAL HIGH (ref 22–32)
Calcium: 9.4 mg/dL (ref 8.9–10.3)
Chloride: 92 mmol/L — ABNORMAL LOW (ref 98–111)
Creatinine, Ser: 0.55 mg/dL — ABNORMAL LOW (ref 0.61–1.24)
GFR, Estimated: >60 mL/min (ref >60)
Glucose, Bld: 68 mg/dL — ABNORMAL LOW (ref 70–99)
Potassium: 4.1 mmol/L (ref 3.5–5.1)
Sodium: 138 mmol/L (ref 135–145)

## 2020-04-11 LAB — HEMOGLOBIN AND HEMATOCRIT, BLOOD
HCT: 48.2 % (ref 39.0–52.0)
Hemoglobin: 14 g/dL (ref 13.0–17.0)

## 2020-04-11 LAB — MAGNESIUM: Magnesium: 1.9 mg/dL (ref 1.7–2.4)

## 2020-04-11 LAB — VITAMIN B1: Vitamin B1 (Thiamine): 261.8 nmol/L — ABNORMAL HIGH (ref 66.5–200.0)

## 2020-04-11 LAB — PHOSPHORUS: Phosphorus: 3.9 mg/dL (ref 2.5–4.6)

## 2020-04-11 MED ORDER — METOPROLOL SUCCINATE ER 50 MG PO TB24
50.0000 mg | ORAL_TABLET | Freq: Every day | ORAL | Status: DC
  Administered 2020-04-12: 50 mg via ORAL
  Filled 2020-04-11 (×2): qty 1

## 2020-04-11 NOTE — Progress Notes (Signed)
Mobility Specialist - Progress Note   04/11/20 1600  Mobility  Activity Ambulated in hall  Level of Assistance Standby assist, set-up cues, supervision of patient - no hands on  Assistive Device Front wheel walker  Distance Ambulated (ft) 200 ft  Mobility Response Tolerated well  Mobility performed by Mobility specialist  $Mobility charge 1 Mobility    Pre-mobility: 59 HR, 93% SpO2 During mobility: 70 HR, 91% SpO2 Post-mobility: 52 HR, 97% SpO2   Pt was sitting in recliner upon arrival utilizing 3L [place] O2. Pt agreed to session. Pt's daughter at bedside. Pt denied pain, nausea, and fatigue this date. Pt stood to RW, mild dizziness noted and rest break taken as resolution. Noted wet chucks under pt. Daughter stepped out to retrieve clean, dry bottoms for pt. Pt ambulated 200' in room/hallway with no LOB noted. O2 > 90% with activity this date. Pt returned to EOB, denying exhaustion and fatigue. Daughter yet to return with dry clothing, so mobility requested a gown change til pants are provided. Pt initially hesitant stating "my pants aren't too wet", but eventually agreeable. Pt was able to doff pants halfway, but needed assistance to pull over heels d/t "jogger style" bottoms. Gown donned, pt returned to recliner where he was left with all needs in reach and alarm set. Overall, pt tolerated session well.    Filiberto Pinks Mobility Specialist 04/11/20, 4:18 PM

## 2020-04-11 NOTE — Progress Notes (Signed)
Physical Therapy Treatment Patient Details Name: Melvin Campbell MRN: [id number] DOB: [birth year] Today's Date: 04/11/2020    History of Present Illness Pt admitted for acute/chronic resp failure with hypoxia now additionally diagnosed with NSTEMI. Pt with complaints of SOB symptoms. Per chart, on 2.5 L of O2 chronic (night).    PT Comments    Patient received in bed, he is agreeable to PT session. Got out of bed and pants were wet, patient unaware. He reports breathing fine, [place] was not in nose correctly and O2 sats at 84%. Re-adjusted [place]. Patient is mod independent with bed mobility. Transfers with min guard/supervision. He ambulated 175 feet around nurses station without ad, min guard.  No lob but patient reaching for counters and rails in hallway. Patient left in recliner with chair alarm on, needs met. He will continue to benefit from skilled PT while here to improve safety with mobility.         Follow Up Recommendations  Home health PT     Equipment Recommendations  Rolling walker with 5" wheels    Recommendations for Other Services       Precautions / Restrictions Precautions Precautions: Fall Restrictions Weight Bearing Restrictions: No    Mobility  Bed Mobility Overal bed mobility: Modified Independent Bed Mobility: Supine to Sit     Supine to sit: Modified independent (Device/Increase time)     General bed mobility comments: use of bed rails    Transfers Overall transfer level: Needs assistance Equipment used: None Transfers: Sit to/from Stand Sit to Stand: Min assist            Ambulation/Gait Ambulation/Gait assistance: Min assist Gait Distance (Feet): 175 Feet Assistive device: 1 person hand held assist Gait Pattern/deviations: Step-through pattern;Decreased step length - right;Decreased step length - left;Decreased stride length Gait velocity: decreased   General Gait Details: Patient is slightly unsteady but no overt LOB, ambulated without AD,  reaching for counter and rails in hallway. No significant fatigue or sob, O2 after ambulating at 87% on 2 lpm. Patient reports no difficulties with breathing.   Stairs             Wheelchair Mobility    Modified Rankin (Stroke Patients Only)       Balance Overall balance assessment: Mild deficits observed, not formally tested Sitting-balance support: Feet supported Sitting balance-Leahy Scale: Good     Standing balance support: No upper extremity supported;During functional activity Standing balance-Leahy Scale: Fair                              Cognition Arousal/Alertness: Awake/alert Behavior During Therapy: WFL for tasks assessed/performed Overall Cognitive Status: Within Functional Limits for tasks assessed                                 General Comments: slightly confused to situation      Exercises      General Comments        Pertinent Vitals/Pain Pain Assessment: No/denies pain    Home Living                      Prior Function            PT Goals (current goals can now be found in the care plan section) Acute Rehab PT Goals Patient Stated Goal: to return home PT Goal Formulation: With patient Time  For Goal Achievement: 04/17/20 Potential to Achieve Goals: Good Progress towards PT goals: Progressing toward goals    Frequency    Min 2X/week      PT Plan Current plan remains appropriate    Co-evaluation              AM-PAC PT "6 Clicks" Mobility   Outcome Measure  Help needed turning from your back to your side while in a flat bed without using bedrails?: None Help needed moving from lying on your back to sitting on the side of a flat bed without using bedrails?: None Help needed moving to and from a bed to a chair (including a wheelchair)?: A Little Help needed standing up from a chair using your arms (e.g., wheelchair or bedside chair)?: A Little Help needed to walk in hospital room?: A  Little Help needed climbing 3-5 steps with a railing? : A Little 6 Click Score: 20    End of Session Equipment Utilized During Treatment: Gait belt;Oxygen Activity Tolerance: Patient tolerated treatment well Patient left: in chair;with call bell/phone within reach;with chair alarm set Nurse Communication: Mobility status PT Visit Diagnosis: Unsteadiness on feet (R26.81);Muscle weakness (generalized) (M62.81);Difficulty in walking, not elsewhere classified (R26.2)     Time: 1130-1145 PT Time Calculation (min) (ACUTE ONLY): 15 min  Charges:  $Gait Training: 8-22 mins                     [employer], PT, GCS 04/11/20,12:00 PM

## 2020-04-11 NOTE — Progress Notes (Signed)
PROGRESS NOTE    Anuj Summons  DZH:[id number] DOB: 12/25/1940 DOA: 03/31/2020 PCP: Patient, No Pcp Per   Brief Narrative: This 80 years old male with PMH significant for HTN, PE with chronic hypoxic respiratory failure on 2.5 L of supplemental oxygen at baseline, cognitive impairment, early dementia, alcohol use disorder and depression presents from assisted living facility with worsening shortness of breath.  Patient has some chronic lung disease,  has been on oxygen since October 2021 because of PE.  In the ER Covid test negative,  CTA chest showed no PE but did show bibasilar atelectasis and pneumonia,  advanced emphysema.  Patient was started on antibiotics and has been improving.  Assessment & Plan:   Principal Problem:   Acute on chronic respiratory failure with hypoxia (HCC) Active Problems:   Elevated troponin   Community acquired pneumonia   Bilateral lower extremity edema  Acute on chronic hypoxic and hypercapnic respiratory failure : Patient has long history of unrecognized respiratory disease.     Patient's recent hospitalization for corneal ulcer was prolonged by hypoxia which resolved with diuresis. Since then, he has been on 2-3L O2 at ALF.   He was initially admitted for hypoxia,  CTA showed aspiration pneumonia.   He has completed 5 days antibiotics without improvement in O2. He was later started on steroids, now completed 5 days, O2 improved considerably in the last few days with steroids and again Lasix. His O2 requirement weaned to 5L , still desaturating with ambulation here. Transitioned Solu-Medrol to prednisone Continue Pulmicort Continue scheduled bronchodilators for now, taper to PRN at discharge Continue Metanebs, incentive spirometry and aggressive Chest PT He was given Lasix once.  BNP 17 - Patient needs O2 concentrator with higher O2 flow. -His O2 saturation has weaned down to 2.5 L at rest.  Elevated troponins: This could be secondary to demand  ischemia. Troponins trending up, cardiology consulted. Patient denies any chest pain.  Patient was heparinized for 48 hours. Cardiology consulted, defer left heart cath in the absence of chest pain and normal echocardiogram.  History pulmonary embolism: Continue apixaban  Hypertension: Continue HCTZ, losartan, metoprolol    Bradycardia Asymptomatic, while sleeping.   Heart rates drops in the range 40- 50.  EKG shows sinus bradycardia. Metoprolol decreased from 75 to 50 mg daily.  Gout Denies Gout symptoms. -Continue allopurinol  Depression -Continue escitalopram  Dementia Patient has some memory impairment, this appears to be worsened by hypoxia. It seems improved.  Moderate protein calorie malnutrition As evidenced by severe chronic illness, poor p.o. intake Phos, mag stable.  -Continue Ensure   DVT prophylaxis: Eliquis Code Status: Full code. Family Communication:  No family at bed side. Disposition Plan:   Status is: Inpatient  Remains inpatient appropriate because:Inpatient level of care appropriate due to severity of illness   Dispo:  Patient From: Assisted Living Facility  Planned Disposition: ALF  Expected discharge date: 1-2 days  Medically stable for discharge: No    Patient needs oxygen concentrator with high flow oxygen.  Case management is working on arranging that.   Consultants:    Pulmonology  Procedures: CTA chest.    Antimicrobials:   Anti-infectives (From admission, onward)   Start     Dose/Rate Route Frequency Ordered Stop   03/31/20 0500  cefTRIAXone (ROCEPHIN) 2 g in sodium chloride 0.9 % 100 mL IVPB        2 g 200 mL/hr over 30 Minutes Intravenous Every 24 hours 03/31/20 0445 04/04/20 0546   03/31/20 0500  azithromycin (ZITHROMAX) 500 mg in sodium chloride 0.9 % 250 mL IVPB        500 mg 250 mL/hr over 60 Minutes Intravenous Every 24 hours 03/31/20 0445 04/04/20 0701      Subjective: Patient was seen and examined at  bedside.  Overnight events noted.    Denies any chest pain. He is weaned down to 2.5 L of supplemental oxygen at rest to keep saturation above 92%. Patient's heart rate remains in the range of 40 to 50s.  Metoprolol has been reduced.   Objective: Vitals:   04/11/20 0131 04/11/20 0453 04/11/20 0507 04/11/20 0746  BP: (!) 126/51 (!) 164/80 137/65 (!) 141/55  Pulse: (!) 49 (!) 53 (!) 49 (!) 48  Resp: 16 20 16 18   Temp: 98.4 F (36.9 C) 97.8 F (36.6 C) 97.9 F (36.6 C) 98.5 F (36.9 C)  TempSrc:   Oral   SpO2: 97% 93% 95% (!) 82%  Weight:      Height:        Intake/Output Summary (Last 24 hours) at 04/11/2020 1136 Last data filed at 04/11/2020 1033 Gross per 24 hour  Intake 480 ml  Output 675 ml  Net -195 ml   Filed Weights   04/05/20 0313 04/06/20 0202 04/07/20 0348  Weight: 81.3 kg 81.6 kg 82 kg    Examination:  General exam: Appears calm and comfortable, not in any acute distress. Respiratory system: Clear to auscultation. Respiratory effort normal. Cardiovascular system: S1 & S2 heard, RRR. No JVD, murmurs, rubs, gallops or clicks. No pedal edema. Gastrointestinal system: Abdomen is nondistended, soft and nontender. No organomegaly or masses felt. Normal bowel sounds heard. Central nervous system: Alert and oriented. No focal neurological deficits. Extremities: Left arm laceration from recent fall.  No edema, no cyanosis, no clubbing. Skin: No rashes, lesions or ulcers Psychiatry: Judgement and insight appear normal. Mood & affect appropriate.     Data Reviewed: I have personally reviewed following labs and imaging studies  CBC: Recent Labs  Lab 04/08/20 0515 04/11/20 0411  WBC 11.7*  --   HGB 14.3 14.0  HCT 48.3 48.2  MCV 84.9  --   PLT 157  --    Basic Metabolic Panel: Recent Labs  Lab 04/06/20 0554 04/07/20 0508 04/08/20 0515 04/09/20 0441 04/10/20 0400 04/11/20 0411  NA 139 137 138 138  --  138  K 3.9 4.0 3.8 3.9  --  4.1  CL 87* 86* 86* 89*   --  92*  CO2 41* 40* 44* 40*  --  38*  GLUCOSE 93 96 88 98  --  68*  BUN 32* 30* 35* 29*  --  27*  CREATININE 0.69 0.52* 0.65 0.49*  --  0.55*  CALCIUM 10.1 10.0 10.0 9.5  --  9.4  MG 1.9 1.8 1.7 1.9 2.0 1.9  PHOS 3.4 3.4 1.8* 3.6 3.2 3.9   GFR: Estimated Creatinine Clearance: 76.8 mL/min (A) (by C-G formula based on SCr of 0.55 mg/dL (L)). Liver Function Tests: No results for input(s): AST, ALT, ALKPHOS, BILITOT, PROT, ALBUMIN in the last 168 hours. No results for input(s): LIPASE, AMYLASE in the last 168 hours. No results for input(s): AMMONIA in the last 168 hours. Coagulation Profile: No results for input(s): INR, PROTIME in the last 168 hours. Cardiac Enzymes: No results for input(s): CKTOTAL, CKMB, CKMBINDEX, TROPONINI in the last 168 hours. BNP (last 3 results) No results for input(s): PROBNP in the last 8760 hours. HbA1C: No results for input(s): HGBA1C  in the last 72 hours. CBG: No results for input(s): GLUCAP in the last 168 hours. Lipid Profile: No results for input(s): CHOL, HDL, LDLCALC, TRIG, CHOLHDL, LDLDIRECT in the last 72 hours. Thyroid Function Tests: No results for input(s): TSH, T4TOTAL, FREET4, T3FREE, THYROIDAB in the last 72 hours. Anemia Panel: No results for input(s): VITAMINB12, FOLATE, FERRITIN, TIBC, IRON, RETICCTPCT in the last 72 hours. Sepsis Labs: No results for input(s): PROCALCITON, LATICACIDVEN in the last 168 hours.  No results found for this or any previous visit (from the past 240 hour(s)). Radiology Studies: DG Chest Port 1 View  Result Date: 04/10/2020 CLINICAL DATA:  Acute on chronic respiratory failure with hypoxia. EXAM: PORTABLE CHEST 1 VIEW COMPARISON:  Chest radiograph April 03, 2020. CT chest March 31, 2020. FINDINGS: Low lung volumes. Elevated left hemidiaphragm. Similar left greater than right basilar opacities, better characterized on prior CT chest. No new consolidation. No visible pneumothorax. No sizable pleural  effusions. Left costophrenic sulcus blunting is favored to relate to left lower lobe opacity (and not a pleural effusion) when correlating with prior CT chest. Cardiomediastinal silhouette is enlarged and accentuated by low lung volumes and portable AP technique. No acute osseous abnormality. Right reverse shoulder arthroplasty. IMPRESSION: 1. Similar appearance to priors with low lung volumes and left greater than right basilar opacities which are favored to represent atelectasis, although infection is not excluded. 2. Cardiomegaly. Electronically Signed   By: Feliberto Harts MD   On: 04/10/2020 13:35    Scheduled Meds: . allopurinol  300 mg Oral Daily  . apixaban  5 mg Oral BID  . aspirin EC  81 mg Oral Daily  . atorvastatin  80 mg Oral Daily  . budesonide (PULMICORT) nebulizer solution  0.25 mg Nebulization BID  . erythromycin  1 application Right Eye TID  . escitalopram  20 mg Oral Daily  . feeding supplement  237 mL Oral BID BM  . folic acid  1 mg Oral Daily  . guaiFENesin  600 mg Oral BID  . hydrochlorothiazide  25 mg Oral Daily  . influenza vaccine adjuvanted  0.5 mL Intramuscular Tomorrow-1000  . ipratropium-albuterol  3 mL Nebulization BID  . losartan  100 mg Oral Daily  . magnesium oxide  400 mg Oral BID  . mouth rinse  15 mL Mouth Rinse BID  . metoprolol succinate  50 mg Oral Daily  . multivitamin with minerals  1 tablet Oral Daily  . polyvinyl alcohol  1 drop Right Eye Q6H  . prednisoLONE acetate  1 drop Right Eye BID  . thiamine  250 mg Oral Daily   Continuous Infusions: . sodium chloride 250 mL (04/01/20 0520)     LOS: 11 days    Time spent: 25 mins.    Cipriano Bunker, MD [facility]   If 7PM-7AM, please contact night-coverage

## 2020-04-11 NOTE — Progress Notes (Signed)
Nutrition Follow Up Note   DOCUMENTATION CODES:   Not applicable  INTERVENTION:   Ensure Enlive po BID, each supplement provides 350 kcal and 20 grams of protein  MVI, thiamine and folic acid po daily   NUTRITION DIAGNOSIS:   Increased nutrient needs related to chronic illness (CHF, CAP) as evidenced by estimated needs.  GOAL:   Patient will meet greater than or equal to 90% of their needs  -met   MONITOR:   PO intake,Supplement acceptance,Labs,Weight trends,Skin,I & O's  ASSESSMENT:   80 year old male with past medical history of hypertension, hyperlipidemia, PE, CHF with chronic hypoxic respiratory failure on 2.5 L/min oxygen at home, ETOH abuse and possible early dementia who is admitted with CAP   Pt with improved appetite and oral intake in hospital; pt eating 100% of meals and drinking Ensure supplements. Per chart, pt is weight stable since admit; however, pt's last documented weight was from 2/20. Recommend continue supplements and vitamins. Refeed labs stable.    Medications reviewed and include: allopurinol, aspirin, folic acid, Mg oxide, MVI, protonix, thiamine   Labs reviewed: K 4.1 wnl, BUN 27(H), creat 0.55(L), P 3.9 wnl, Mg 1.9 wnl  Diet Order:    Diet Order            Diet heart healthy/carb modified Room service appropriate? Yes; Fluid consistency: Thin  Diet effective now                EDUCATION NEEDS:   Education needs have been addressed  Skin:  Skin Assessment: Reviewed RN Assessment (ecchymosis)  Last BM:  2/21- type 6  Height:   Ht Readings from Last 1 Encounters:  03/31/20 _0  (1.702 m)    Weight:   Wt Readings from Last 1 Encounters:  04/07/20 82 kg    Ideal Body Weight:  67.2 kg  BMI:  Body mass index is 28.32 kg/m.  Estimated Nutritional Needs:   Kcal:  1900-2200kcal/day  Protein:  95-110g/day  Fluid:  1.7-2.0L/day  Koleen Distance MS, RD, LDN Please refer to [facility] for RD and/or RD on-call/weekend/after hours  pager

## 2020-04-11 NOTE — TOC Progression Note (Addendum)
Transition of Care [facility]) - Progression Note    Patient Details  Name: Melvin Campbell MRN: [id number] Date of Birth: [birth year]  Transition of Care [facility]) CM/SW Contact  Margarito Liner, LCSW Phone Number: 04/11/2020, 12:23 PM  Clinical Narrative:   Spoke with Keda at the [facility]. Patient's concentrator says [facility]. CSW called and spoke with representative who stated they no longer supply his oxygen. He was discharged 01/01/20. Gave referral to [facility] representative. Per MD, plan on discharge tomorrow. RN will do sats test. Updated [facility] representative. Left voicemail at the [facility] to let them know of plan for discharge tomorrow.  3:24 pm: No call back from the [place] yet. Called again. Did not leave a second voicemail.  4:37 pm: Called and notified Dustin at the [place] of plan for discharge tomorrow. Ordered oxygen and RW through [employer]. Daughter said patient will likely need EMS transport so asked [employer] to deliver DME to [facility] [place].  Expected Discharge Plan and Services                                                 Social Determinants of Health (SDOH) Interventions    Readmission Risk Interventions No flowsheet data found.

## 2020-04-11 NOTE — Progress Notes (Signed)
SATURATION QUALIFICATIONS:  [facility]atur[facility] = 94%  Patient Saturations on Room Air while Ambulating = 79%  Patient Saturations on 2 Liters of oxygen while Ambulating = 94%

## 2020-04-11 NOTE — TOC Progression Note (Signed)
Transition of Care [facility]) - Progression Note    Patient Details  Name: Melvin Campbell MRN: [id number] Date of Birth: 02-Feb-1941  Transition [facility] Care [facility]) CM/SW Contact  Margarito Liner, LCSW Phone Number: 04/11/2020, 11:08 AM  Clinical Narrative: Left voicemail at [facility] to see if they supply patient's oxygen.    Expected Discharge Plan and Services                                                 Social Determinants of Health (SDOH) Interventions    Readmission Risk Interventions No flowsheet data found.

## 2020-04-11 NOTE — Care Management Important Message (Signed)
Important Message  Patient Details  Name: Melvin Campbell MRN: [id number] Date of Birth: 05-21-1940   Medicare Important Message Given:  Yes     Johnell Comings 04/11/2020, 1:20 PM

## 2020-04-11 NOTE — Progress Notes (Signed)
Pulmonary Medicine          Date: 04/11/2020,   MRN# [id number] Melvin Campbell [birth year]     AdmissionWeight: 84.8 kg                 CurrentWeight: 82 kg   Referring physician: Dr Maryfrances Bunnell   CHIEF COMPLAINT:   Chronic hypercapnic and hypoxemic respiratory failure   HISTORY OF PRESENT ILLNESS   Melvin Campbell is a 80 y.o. male with medical history significant for hypertension, dyslipidemia and PE with chronic respiratory failure on home O2 at 2.5 L/min by nasal cannula, and dementia who presented to the emergency room with acute onset of worsening dyspnea at his skilled nursing facility with associated hypoxemia with a pulse oximetry of 87% on his O2 at 2.5 L and wheezing.  Pulse oximetry improved to 93% on 4 L of O2.  He denied chest pain nausea vomiting or diaphoresis or palpitations.  He admited to chronic lower extremity edema.  No fever or chills.  No bleeding diathesis.  No dysuria, oliguria or hematuria or flank pain.   Labs revealed a BUN of 39 creatinine 0.74, BNP of 23.2 and high-sensitivity troponin of 1263 with lactic acid of 1 and CBC levels unremarkable.  Influenza antigens and COVID-19 PCR came back negative.  Blood cultures were drawn.  INR is 1.4 and PT 16.8 with PTT 45. Chest CTA revealed no PE.  Showed cardiomegaly and dilated main pulmonary artery consistent with pulmonary artery hypertension by basal independent right middle lobe consolidation with volume loss likely due to atelectasis and differential diagnosis would include pneumonia.  Aortic atherosclerosis tortuosity and moderate emphysema. Patient has been on BIPAP and has had recurrent hypercanpnia  04/04/20- patient is stable, he is speaking in full sentences. HFNC weaned to 12 and can be further weaned. Plan to continue current care.  His steroids are being weaned.   04/09/20- patient significantly improved now to 5L/min [place].   04/10/20- patient is improved, he is down to 3L/min [place].  He is speaking  in full sentences and is not with shortness of breath. He is participating with PT/OT/CPT.   Interval CXR today.   04/11/20- repeat CXR with improved atelectasis. Patient is now down to 3L/min.  He is resting in chair in seated position during my evaluation.  He had PT today did well with mild desaturtion. Plan to continue with chest PT and d/c home on 2.5L  Which paitent is approaching now   PAST MEDICAL HISTORY   History reviewed. No pertinent past medical history.   SURGICAL HISTORY   No available surgical data available at this time  FAMILY HISTORY   No family history on file.   SOCIAL HISTORY   Social History   Tobacco Use  . Smoking status: Never Smoker  . Smokeless tobacco: Never Used     MEDICATIONS    Home Medication:    Current Medication:  Current Facility-Administered Medications:  .  0.9 %  sodium chloride infusion, , Intravenous, PRN, Mansy, Jan A, MD, Last Rate: 4 mL/hr at 04/01/20 0520, 250 mL at 04/01/20 0520 .  acetaminophen (TYLENOL) tablet 650 mg, 650 mg, Oral, Q4H PRN, Mansy, Jan A, MD, 650 mg at 03/31/20 0742 .  albuterol (PROVENTIL) (2.5 MG/3ML) 0.083% nebulizer solution 2.5 mg, 2.5 mg, Nebulization, Q2H PRN, Danford, Earl Lites, MD .  allopurinol (ZYLOPRIM) tablet 300 mg, 300 mg, Oral, Daily, Mansy, Jan A, MD, 300 mg at 04/11/20 0844 .  ALPRAZolam Prudy Feeler) tablet  0.25 mg, 0.25 mg, Oral, BID PRN, Mansy, Jan A, MD, 0.25 mg at 04/09/20 1833 .  apixaban (ELIQUIS) tablet 5 mg, 5 mg, Oral, BID, Ronnald Ramp, RPH, 5 mg at 04/11/20 0844 .  aspirin EC tablet 81 mg, 81 mg, Oral, Daily, Leanora Ivanoff, PA-C, 81 mg at 04/11/20 0845 .  atorvastatin (LIPITOR) tablet 80 mg, 80 mg, Oral, Daily, Mansy, Jan A, MD, 80 mg at 04/11/20 0844 .  budesonide (PULMICORT) nebulizer solution 0.25 mg, 0.25 mg, Nebulization, BID, Esaw Grandchild A, DO, 0.25 mg at 04/11/20 0732 .  erythromycin ophthalmic ointment 1 application, 1 application, Right Eye, TID, Mansy, Vernetta Honey, MD, 1  application at 04/11/20 [phone] .  escitalopram (LEXAPRO) tablet 20 mg, 20 mg, Oral, Daily, Mansy, Jan A, MD, 20 mg at 04/11/20 0858 .  feeding supplement (ENSURE ENLIVE / ENSURE PLUS) liquid 237 mL, 237 mL, Oral, BID BM, Danford, Earl Lites, MD, 237 mL at 04/11/20 0853 .  folic acid (FOLVITE) tablet 1 mg, 1 mg, Oral, Daily, Mansy, Jan A, MD, 1 mg at 04/11/20 0844 .  furosemide (LASIX) tablet 20 mg, 20 mg, Oral, Daily PRN, Mansy, Jan A, MD, 20 mg at 03/31/20 0742 .  guaiFENesin (MUCINEX) 12 hr tablet 600 mg, 600 mg, Oral, BID, Mansy, Jan A, MD, 600 mg at 04/11/20 0848 .  hydrochlorothiazide (HYDRODIURIL) tablet 25 mg, 25 mg, Oral, Daily, Mansy, Jan A, MD, 25 mg at 04/11/20 0848 .  influenza vaccine adjuvanted (FLUAD) injection 0.5 mL, 0.5 mL, Intramuscular, Tomorrow-1000, Danford, Christopher P, MD .  ipratropium-albuterol (DUONEB) 0.5-2.5 (3) MG/3ML nebulizer solution 3 mL, 3 mL, Nebulization, BID, Danford, Earl Lites, MD, 3 mL at 04/11/20 0732 .  losartan (COZAAR) tablet 100 mg, 100 mg, Oral, Daily, Mansy, Jan A, MD, 100 mg at 04/11/20 0845 .  magnesium oxide (MAG-OX) tablet 400 mg, 400 mg, Oral, BID, Mansy, Jan A, MD, 400 mg at 04/11/20 0848 .  MEDLINE mouth rinse, 15 mL, Mouth Rinse, BID, Mansy, Jan A, MD, 15 mL at 04/11/20 0859 .  metoprolol succinate (TOPROL-XL) 24 hr tablet 50 mg, 50 mg, Oral, Daily, Cipriano Bunker, MD .  multivitamin with minerals tablet 1 tablet, 1 tablet, Oral, Daily, Mansy, Vernetta Honey, MD, 1 tablet at 04/11/20 0845 .  nitroGLYCERIN (NITROSTAT) SL tablet 0.4 mg, 0.4 mg, Sublingual, Q5 Min x 3 PRN, Mansy, Jan A, MD .  ondansetron [facility]) injection 4 mg, 4 mg, Intravenous, Q6H PRN, Mansy, Jan A, MD .  polyvinyl alcohol (LIQUIFILM TEARS) 1.4 % ophthalmic solution 1 drop, 1 drop, Right Eye, Q6H, Mansy, Jan A, MD, 1 drop at 04/11/20 [phone] .  prednisoLONE acetate (PRED FORTE) 1 % ophthalmic suspension 1 drop, 1 drop, Right Eye, BID, Mansy, Jan A, MD, 1 drop at 04/11/20 0851 .   thiamine tablet 250 mg, 250 mg, Oral, Daily, Esaw Grandchild A, DO, 250 mg at 04/11/20 6967 .  zolpidem (AMBIEN) tablet 5 mg, 5 mg, Oral, QHS PRN, Mansy, Jan A, MD, 5 mg at 04/06/20 2108    ALLERGIES   Amlodipine     REVIEW OF SYSTEMS    Review of Systems:  Gen:  Denies  fever, sweats, chills weigh loss  HEENT: Denies blurred vision, double vision, ear pain, eye pain, hearing loss, nose bleeds, sore throat Cardiac:  No dizziness, chest pain or heaviness, chest tightness,edema Resp:   Denies cough or sputum porduction, shortness of breath,wheezing, hemoptysis,  Gi: Denies swallowing difficulty, stomach pain, nausea or vomiting, diarrhea, constipation, bowel incontinence Gu:  Denies bladder incontinence, burning urine Ext:   Denies Joint pain, stiffness or swelling Skin: Denies  skin rash, easy bruising or bleeding or hives Endoc:  Denies polyuria, polydipsia , polyphagia or weight change Psych:   Denies depression, insomnia or hallucinations   Other:  All other systems negative   VS: BP (!) 141/55 (BP Location: Left Arm)   Pulse (!) 48   Temp 98.5 F (36.9 C)   Resp 18   Ht 5\' 7"  (1.702 m)   Wt 82 kg   SpO2 (!) 87%   BMI 28.32 kg/m      PHYSICAL EXAM    GENERAL:NAD, no fevers, chills, no weakness no fatigue HEAD: Normocephalic, atraumatic.  EYES: Pupils equal, round, reactive to light. Extraocular muscles intact. No scleral icterus.  MOUTH: Moist mucosal membrane. Dentition intact. No abscess noted.  EAR, NOSE, THROAT: Clear without exudates. No external lesions.  NECK: Supple. No thyromegaly. No nodules. No JVD.  PULMONARY: crackles at bases bilaterally  CARDIOVASCULAR: S1 and S2. Regular rate and rhythm. No murmurs, rubs, or gallops. No edema. Pedal pulses 2+ bilaterally.  GASTROINTESTINAL: Soft, nontender, nondistended. No masses. Positive bowel sounds. No hepatosplenomegaly.  MUSCULOSKELETAL: No swelling, clubbing, or edema. Range of motion full in all  extremities.  NEUROLOGIC: Cranial nerves II through XII are intact. No gross focal neurological deficits. Sensation intact. Reflexes intact.  SKIN: No ulceration, lesions, rashes, or cyanosis. Skin warm and dry. Turgor intact.  PSYCHIATRIC: Mood, affect within normal limits. The patient is awake, alert and oriented x 3. Insight, judgment intact.       IMAGING    DG Chest 2 View  Result Date: 03/31/2020 CLINICAL DATA:  Shortness of breath. EXAM: CHEST - 2 VIEW COMPARISON:  None available. FINDINGS: Low lung volumes limit assessment. Heart size upper normal. Aortic atherosclerosis and tortuosity. There are streaky bibasilar opacities. Mild diffuse peribronchial thickening. No significant pleural effusion. No pneumothorax. Degenerative change throughout the spine. Right shoulder arthroplasty. No acute osseous abnormalities are seen. IMPRESSION: 1. Low lung volumes with streaky bibasilar opacities, favoring atelectasis. Mild diffuse peribronchial thickening. 2. Diffuse aortic atherosclerosis. Aortic tortuosity. Aortic Atherosclerosis (ICD10-I70.0). Electronically Signed   By: Narda RutherfordMelanie  Sanford M.D.   On: 03/31/2020 01:42   CT Angio Chest PE W/Cm &/Or Wo Cm  Result Date: 03/31/2020 CLINICAL DATA:  PE suspected, high prob PE in 01/2020; hypoxic EXAM: CT ANGIOGRAPHY CHEST WITH CONTRAST TECHNIQUE: Multidetector CT imaging of the chest was performed using the standard protocol during bolus administration of intravenous contrast. Multiplanar CT image reconstructions and MIPs were obtained to evaluate the vascular anatomy. CONTRAST:  100mL OMNIPAQUE IOHEXOL 350 MG/ML SOLN COMPARISON:  Chest radiograph earlier today.  No prior CT available. FINDINGS: Cardiovascular: No evidence of acute or chronic pulmonary embolus. There are no intraluminal pulmonary arterial filling defects. No intraluminal webs. Dilated main pulmonary artery at 4 cm. Multi chamber cardiomegaly. Coronary artery calcifications. Aortic  atherosclerosis and tortuosity. Cannot assess for dissection given phase of contrast tailored to pulmonary artery evaluation. No pericardial effusion. Mediastinum/Nodes: Prominent right lower paratracheal node measures 10 mm. 10 mm left hilar node. No visualized thyroid nodule. Patulous esophagus. Lungs/Pleura: Bibasilar and dependent right middle lobe consolidation. Bibasilar volume loss. Moderate emphysema. No septal thickening or pulmonary edema no significant pleural effusion. Upper Abdomen: Left perinephric edema is partially included, nonspecific. Lobulated splenic contours. Musculoskeletal: Diffuse thoracic spondylosis. Subacute or remote lower lateral left rib fractures with callus formation. Right shoulder arthroplasty. Review of the MIP images confirms the above findings.  IMPRESSION: 1. No pulmonary embolus. Cardiomegaly. Dilated main pulmonary artery consistent with pulmonary arterial hypertension. 2. Bibasilar and dependent right middle lobe consolidation with volume loss. Atelectasis is favored, however sterility is indeterminate by imaging, recommend correlation for pneumonia symptoms. 3. Nonspecific left perinephric edema in the upper abdomen is partially included, may be chronic or seen with urinary tract infection. 4. Aortic atherosclerosis and tortuosity. 5. Moderate emphysema Aortic Atherosclerosis (ICD10-I70.0) and Emphysema (ICD10-J43.9). Electronically Signed   By: Narda Rutherford M.D.   On: 03/31/2020 03:43   DG Chest Port 1 View  Result Date: 04/10/2020 CLINICAL DATA:  Acute on chronic respiratory failure with hypoxia. EXAM: PORTABLE CHEST 1 VIEW COMPARISON:  Chest radiograph April 03, 2020. CT chest March 31, 2020. FINDINGS: Low lung volumes. Elevated left hemidiaphragm. Similar left greater than right basilar opacities, better characterized on prior CT chest. No new consolidation. No visible pneumothorax. No sizable pleural effusions. Left costophrenic sulcus blunting is favored  to relate to left lower lobe opacity (and not a pleural effusion) when correlating with prior CT chest. Cardiomediastinal silhouette is enlarged and accentuated by low lung volumes and portable AP technique. No acute osseous abnormality. Right reverse shoulder arthroplasty. IMPRESSION: 1. Similar appearance to priors with low lung volumes and left greater than right basilar opacities which are favored to represent atelectasis, although infection is not excluded. 2. Cardiomegaly. Electronically Signed   By: Feliberto Harts MD   On: 04/10/2020 13:35   DG Chest Port 1 View  Result Date: 04/03/2020 CLINICAL DATA:  Respiratory failure with hypoxia EXAM: PORTABLE CHEST 1 VIEW COMPARISON:  March 31, 2020 chest radiograph and chest CT FINDINGS: There is underlying parenchymal fibrotic type change. There are areas of scattered atelectatic change. There is no frank edema or consolidation. Heart is upper normal in size with pulmonary vascularity normal. No adenopathy. There is aortic atherosclerosis. 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 There is right carotid artery calcification. IMPRESSION: Underlying fibrosis and scattered areas of atelectasis. No edema or airspace opacity. Stable cardiac silhouette. Aortic Atherosclerosis (ICD10-I70.0). There is right carotid artery calcification. Electronically Signed   By: Bretta Bang III M.D.   On: 04/03/2020 12:19   ECHOCARDIOGRAM COMPLETE  Result Date: 04/01/2020    ECHOCARDIOGRAM REPORT   Patient Name:   ARMEN WARING Date of Exam: 04/01/2020 Medical Rec #:  [id number]        Height:       67.0 in Accession #:    [id number]       Weight:       187.0 lb Date of Birth:  Nov 19, 1940         BSA:          1.965 m Patient Age:    79 years         BP:           125/61 mmHg Patient Gender: M                HR:           58 bpm. Exam Location:  [facility] Procedure: 2D Echo, Cardiac Doppler and Color Doppler Indications:     NSTEMI I21.4  History:         Patient  has no prior history of Echocardiogram examinations. No                  medical history on file.  Sonographer:     Cristela Blue RDCS (AE) Referring Phys:  [phone] [facility]  A MANSY Diagnosing Phys: Lorine Bears MD  Sonographer Comments: Suboptimal apical window. IMPRESSIONS  1. Left ventricular ejection fraction, by estimation, is 60 to 65%. The left ventricle has normal function. The left ventricle has no regional wall motion abnormalities. There is moderate left ventricular hypertrophy. Left ventricular diastolic parameters are consistent with Grade II diastolic dysfunction (pseudonormalization).  2. Right ventricular systolic function is normal. The right ventricular size is normal. Tricuspid regurgitation signal is inadequate for assessing PA pressure.  3. Left atrial size was mildly dilated.  4. The mitral valve is normal in structure. No evidence of mitral valve regurgitation. No evidence of mitral stenosis. Moderate mitral annular calcification.  5. The aortic valve is normal in structure. Aortic valve regurgitation is not visualized. Mild aortic valve stenosis.  6. Aortic dilatation noted. There is mild dilatation of the ascending aorta, measuring 41 mm.  7. The inferior vena cava is dilated in size with <50% respiratory variability, suggesting right atrial pressure of 15 mmHg. FINDINGS  Left Ventricle: Left ventricular ejection fraction, by estimation, is 60 to 65%. The left ventricle has normal function. The left ventricle has no regional wall motion abnormalities. The left ventricular internal cavity size was normal in size. There is  moderate left ventricular hypertrophy. Left ventricular diastolic parameters are consistent with Grade II diastolic dysfunction (pseudonormalization). Right Ventricle: The right ventricular size is normal. No increase in right ventricular wall thickness. Right ventricular systolic function is normal. Tricuspid regurgitation signal is inadequate for assessing PA pressure. Left  Atrium: Left atrial size was mildly dilated. Right Atrium: Right atrial size was normal in size. Pericardium: There is no evidence of pericardial effusion. Mitral Valve: The mitral valve is normal in structure. Moderate mitral annular calcification. No evidence of mitral valve regurgitation. No evidence of mitral valve stenosis. Tricuspid Valve: The tricuspid valve is normal in structure. Tricuspid valve regurgitation is not demonstrated. No evidence of tricuspid stenosis. Aortic Valve: The aortic valve is normal in structure. Aortic valve regurgitation is not visualized. Mild aortic stenosis is present. Aortic valve mean gradient measures 4.5 mmHg. Aortic valve peak gradient measures 7.7 mmHg. Aortic valve area, by VTI measures 2.13 cm. Pulmonic Valve: The pulmonic valve was normal in structure. Pulmonic valve regurgitation is not visualized. No evidence of pulmonic stenosis. Aorta: The aortic root is normal in size and structure and aortic dilatation noted. There is mild dilatation of the ascending aorta, measuring 41 mm. Venous: The inferior vena cava is dilated in size with less than 50% respiratory variability, suggesting right atrial pressure of 15 mmHg. IAS/Shunts: No atrial level shunt detected by color flow Doppler.  LEFT VENTRICLE PLAX 2D LVIDd:         4.14 cm  Diastology LVIDs:         2.69 cm  LV e' medial:    4.24 cm/s LV PW:         1.14 cm  LV E/e' medial:  22.6 LV IVS:        1.16 cm  LV e' lateral:   4.57 cm/s LVOT diam:     2.20 cm  LV E/e' lateral: 21.0 LV SV:         73 LV SV Index:   37 LVOT Area:     3.80 cm  RIGHT VENTRICLE RV Basal diam:  3.37 cm RV S prime:     9.46 cm/s TAPSE (M-mode): 3.8 cm LEFT ATRIUM             Index  RIGHT ATRIUM           Index LA diam:        3.90 cm 1.98 cm/m  RA Area:     10.60 cm LA Vol (A2C):   78.4 ml 39.89 ml/m RA Volume:   20.70 ml  10.53 ml/m LA Vol (A4C):   70.5 ml 35.87 ml/m LA Biplane Vol: 79.9 ml 40.66 ml/m  AORTIC VALVE                     PULMONIC VALVE AV Area (Vmax):    2.13 cm     PV Vmax:        0.69 m/s AV Area (Vmean):   2.18 cm     PV Peak grad:   1.9 mmHg AV Area (VTI):     2.13 cm     RVOT Peak grad: 2 mmHg AV Vmax:           138.50 cm/s AV Vmean:          101.500 cm/s AV VTI:            0.342 m AV Peak Grad:      7.7 mmHg AV Mean Grad:      4.5 mmHg LVOT Vmax:         77.70 cm/s LVOT Vmean:        58.300 cm/s LVOT VTI:          0.192 m LVOT/AV VTI ratio: 0.56  AORTA Ao Root diam: 4.00 cm MITRAL VALVE                TRICUSPID VALVE MV Area (PHT): 3.95 cm     TR Peak grad:   8.4 mmHg MV Decel Time: 192 msec     TR Vmax:        145.00 cm/s MV E velocity: 96.00 cm/s MV A velocity: 113.00 cm/s  SHUNTS MV E/A ratio:  0.85         Systemic VTI:  0.19 m                             Systemic Diam: 2.20 cm Lorine Bears MD Electronically signed by Lorine Bears MD Signature Date/Time: 04/01/2020/10:36:14 AM    Final            ASSESSMENT/PLAN   Acute on chronic hypercapnic hypoxemic respiratory failure   - patient has borderline normal chronically hypercapnic respiratory compensated arterial blood gas    - he is not morbidly obese and does not have obesity hypoventilation syndrome or thoracic restriction associated chronic lung disease requiring NIV   - patient does not technically need BIPAP at this time but should be evaluated again once in chronic stable state to evaluate ventilation   - would recommend to continue current carepath including empiric therapy for CAP via antibiotics and Acute COPD exacerbation with solumedrol, nebulizer therapy and antimicrobials  -will obtain spirometry with graph overnight -patient currently on 13L/min HFNC>>5L>>>3L/min  -discussed care plan with Attending physician and RN as group.     Bibasilar atelectasis    - as seen above patient has signficant posterior basal atelectasis bilaterally     - will start recrutiment maneuvers with METAneb therapy with A- QID   -PT/OT continue      Chronic diastolic stage 2 heart failure  - patient has PRN lasix ordered , there is pulmonary interstitial edema , recommend diuresis, renal function is within  reference range and patient is net positive on fluid balance     Thank you for allowing me to participate in the care of this patient.   Patient/Family are satisfied with care plan and all questions have been answered.  This document was prepared using Dragon voice recognition software and may include unintentional dictation errors.     Vida Rigger, M.D.  Division of Pulmonary & Critical Care Medicine  [facility]

## 2020-04-12 LAB — RESP PANEL BY RT-PCR (FLU A&B, COVID) ARPGX2
Influenza A by PCR: NEGATIVE
Influenza B by PCR: NEGATIVE
SARS Coronavirus 2 by RT PCR: NEGATIVE

## 2020-04-12 LAB — PHOSPHORUS: Phosphorus: 3.7 mg/dL (ref 2.5–4.6)

## 2020-04-12 LAB — MAGNESIUM: Magnesium: 1.8 mg/dL (ref 1.7–2.4)

## 2020-04-12 MED ORDER — BUDESONIDE 0.25 MG/2ML IN SUSP: 0.2500 mg | Freq: Two times a day (BID) | RESPIRATORY_TRACT | 12 refills | Status: AC

## 2020-04-12 MED ORDER — ATORVASTATIN CALCIUM 80 MG PO TABS: 80.0000 mg | ORAL_TABLET | Freq: Every day | ORAL | 1 refills | Status: AC

## 2020-04-12 MED ORDER — BUDESONIDE 0.25 MG/2ML IN SUSP: 0.2500 mg | Freq: Two times a day (BID) | RESPIRATORY_TRACT | 12 refills | Status: DC

## 2020-04-12 MED ORDER — ATORVASTATIN CALCIUM 80 MG PO TABS: 80.0000 mg | ORAL_TABLET | Freq: Every day | ORAL | 1 refills | Status: DC

## 2020-04-12 MED ORDER — NITROGLYCERIN 0.4 MG SL SUBL: 0.4000 mg | SUBLINGUAL_TABLET | SUBLINGUAL | 1 refills | Status: DC | PRN

## 2020-04-12 MED ORDER — NITROGLYCERIN 0.4 MG SL SUBL: 0.4000 mg | SUBLINGUAL_TABLET | SUBLINGUAL | 1 refills | Status: AC | PRN

## 2020-04-12 MED ORDER — ASPIRIN 81 MG PO TBEC: 81.0000 mg | DELAYED_RELEASE_TABLET | Freq: Every day | ORAL | 11 refills | Status: AC

## 2020-04-12 MED ORDER — SENNOSIDES-DOCUSATE SODIUM 8.6-50 MG PO TABS
1.0000 | ORAL_TABLET | Freq: Two times a day (BID) | ORAL | Status: DC
  Administered 2020-04-12: 1 via ORAL
  Filled 2020-04-12: qty 1

## 2020-04-12 MED ORDER — ASPIRIN 81 MG PO TBEC: 81.0000 mg | DELAYED_RELEASE_TABLET | Freq: Every day | ORAL | 11 refills | Status: DC

## 2020-04-12 NOTE — Progress Notes (Signed)
Melvin Campbell to be D/C'd [name] per MD order. Discussed prescriptions and follow up appointments with the [name] RN over the phone with [name]. Prescriptions placed in discharge packet, medication list explained in detail.   Allergies as of 04/12/2020      Reactions   Amlodipine       Medication List    TAKE these medications   acetaminophen 325 MG tablet Commonly known as: TYLENOL Take 650 mg by mouth every 4 (four) hours as needed.   albuterol 108 (90 Base) MCG/ACT inhaler Commonly known as: VENTOLIN HFA Inhale 2 puffs into the lungs every 4 (four) hours as needed.   allopurinol 300 MG tablet Commonly known as: ZYLOPRIM Take 300 mg by mouth daily.   Artificial Tears 1.4 % ophthalmic solution Generic drug: polyvinyl alcohol Place 1 drop into the right eye every 6 (six) hours.   aspirin 81 MG EC tablet Take 1 tablet (81 mg total) by mouth daily. Swallow whole. Start taking on: April 13, 2020   atorvastatin 80 MG tablet Commonly known as: LIPITOR Take 1 tablet (80 mg total) by mouth daily. Start taking on: April 13, 2020   B-1 100 MG Tabs Take 1 tablet by mouth daily.   budesonide 0.25 MG/2ML nebulizer solution Commonly known as: PULMICORT Take 2 mLs (0.25 mg total) by nebulization 2 (two) times daily.   Eliquis 5 MG Tabs tablet Generic drug: apixaban Take 5 mg by mouth 2 (two) times daily.   erythromycin ophthalmic ointment Place 1 application into the right eye 3 (three) times daily.   escitalopram 20 MG tablet Commonly known as: LEXAPRO Take 20 mg by mouth daily.   folic acid 1 MG tablet Commonly known as: FOLVITE Take 1 mg by mouth daily.   furosemide 20 MG tablet Commonly known as: LASIX Take 20 mg by mouth daily as needed.   hydrochlorothiazide 25 MG tablet Commonly known as: HYDRODIURIL Take 25 mg by mouth daily.   losartan 100 MG tablet Commonly known as: COZAAR Take 100 mg by mouth daily.   magnesium oxide 400 (241.3 Mg) MG  tablet Commonly known as: MAG-OX Take 1 tablet by mouth 2 (two) times daily.   metoprolol succinate 50 MG 24 hr tablet Commonly known as: TOPROL-XL Take 50 mg by mouth daily.   metoprolol succinate 25 MG 24 hr tablet Commonly known as: TOPROL-XL Take 25 mg by mouth daily.   Multi-Day Plus Minerals Tabs Take 1 tablet by mouth daily.   nitroGLYCERIN 0.4 MG SL tablet Commonly known as: NITROSTAT Place 1 tablet (0.4 mg total) under the tongue every 5 (five) minutes x 3 doses as needed for chest pain.   prednisoLONE acetate 1 % ophthalmic suspension Commonly known as: PRED FORTE Administer 1 drop to the right eye two (2) times a day.            Durable Medical Equipment  (From admission, onward)         Start     Ordered   04/11/20 1634  For home use only DME Walker rolling  Once       Question Answer Comment  Walker: With 5 Inch Wheels   Patient needs a walker to treat with the following condition Generalized weakness      04/11/20 1633   04/11/20 1633  For home use only DME oxygen  Once       Question Answer Comment  Length of Need Lifetime   Mode or (Route) Nasal cannula  Liters per Minute 2   Frequency Continuous (stationary and portable oxygen unit needed)   Oxygen conserving device Yes   Oxygen delivery system Gas      04/11/20 1632           Discharge Care Instructions  (From admission, onward)         Start     Ordered   04/12/20 0000  Discharge wound care:       Comments: Follow up PCP   04/12/20 1205          Vitals:   04/12/20 0954 04/12/20 1436  BP: 122/70 125/68  Pulse: 73 65  Resp: 18 18  Temp: 98 F (36.7 C) 98.8 F (37.1 C)  SpO2: 91% 92%    Skin clean, dry and intact without evidence of skin break down, no evidence of skin tears noted. IV catheter discontinued intact. Site without signs and symptoms of complications. Dressing and pressure applied. Pt denies pain at this time. No complaints noted.  An After Visit Summary  was printed and given to the EMS. Patient escorted via stretcher and D/C to [name] via ambulance.   Madie Reno, RN

## 2020-04-12 NOTE — Progress Notes (Signed)
COVID swab collected and sent to lab.   Adelai Achey D Jaselyn Nahm, RN  

## 2020-04-12 NOTE — NC FL2 (Signed)
[place] MEDICAID [id number] LEVEL OF CARE SCREENING TOOL     IDENTIFICATION  Patient Name: Melvin Campbell Birthdate: Nov 03, 1940 Sex: male Admission Date (Current Location): 03/31/2020  [facility] and [place] Number:  Chiropodist and Address:         Provider Number: [id number]  Attending Physician Name and Address:  Cipriano Bunker, MD  Relative Name and Phone Number:       Current Level of Care: Hospital Recommended Level of Care: Assisted Living Facility Prior Approval Number:    Date Approved/Denied:   PASRR Number:    Discharge Plan: Other (Comment) (ALF)    Current Diagnoses: Patient Active Problem List   Diagnosis Date Noted  . Acute on chronic respiratory failure with hypoxia (HCC) 03/31/2020  . Elevated troponin 03/31/2020  . Community acquired pneumonia 03/31/2020  . Bilateral lower extremity edema 03/31/2020    Orientation RESPIRATION BLADDER Height & Weight     Self,Place  O2 (2L) Continent Weight: 82 kg Height:  5\' 7"  (170.2 cm)  BEHAVIORAL SYMPTOMS/MOOD NEUROLOGICAL BOWEL NUTRITION STATUS      Continent Diet (Heart Healthy carb modified)  AMBULATORY STATUS COMMUNICATION OF NEEDS Skin   Limited Assist   Skin abrasions                       Personal Care Assistance Level of Assistance              Functional Limitations Info             SPECIAL CARE FACTORS FREQUENCY  PT (By licensed PT),OT (By licensed OT)     PT Frequency: [facility] OT Frequency: [facility]            Contractures Contractures Info: Not present    Additional Factors Info  Code Status,Allergies Code Status Info: full Allergies Info: amlodipine           TAKE these medications       acetaminophen 325 MG tablet Commonly known as: TYLENOL Take 650 mg by mouth every 4 (four) hours as needed.   albuterol 108 (90 Base) MCG/ACT inhaler Commonly known as: VENTOLIN HFA Inhale 2 puffs into the lungs every 4 (four) hours as  needed.   allopurinol 300 MG tablet Commonly known as: ZYLOPRIM Take 300 mg by mouth daily.   Artificial Tears 1.4 % ophthalmic solution Generic drug: polyvinyl alcohol Place 1 drop into the right eye every 6 (six) hours.   aspirin 81 MG EC tablet Take 1 tablet (81 mg total) by mouth daily. Swallow whole. Start taking on: April 13, 2020   atorvastatin 80 MG tablet Commonly known as: LIPITOR Take 1 tablet (80 mg total) by mouth daily. Start taking on: April 13, 2020   B-1 100 MG Tabs Take 1 tablet by mouth daily.   budesonide 0.25 MG/2ML nebulizer solution Commonly known as: PULMICORT Take 2 mLs (0.25 mg total) by nebulization 2 (two) times daily.   Eliquis 5 MG Tabs tablet Generic drug: apixaban Take 5 mg by mouth 2 (two) times daily.   erythromycin ophthalmic ointment Place 1 application into the right eye 3 (three) times daily.   escitalopram 20 MG tablet Commonly known as: LEXAPRO Take 20 mg by mouth daily.   folic acid 1 MG tablet Commonly known as: FOLVITE Take 1 mg by mouth daily.   furosemide 20 MG tablet Commonly known as: LASIX Take 20 mg by mouth daily as needed.   hydrochlorothiazide 25 MG  tablet Commonly known as: HYDRODIURIL Take 25 mg by mouth daily.   losartan 100 MG tablet Commonly known as: COZAAR Take 100 mg by mouth daily.   magnesium oxide 400 (241.3 Mg) MG tablet Commonly known as: MAG-OX Take 1 tablet by mouth 2 (two) times daily.   metoprolol succinate 50 MG 24 hr tablet Commonly known as: TOPROL-XL Take 50 mg by mouth daily.   metoprolol succinate 25 MG 24 hr tablet Commonly known as: TOPROL-XL Take 25 mg by mouth daily.   Multi-Day Plus Minerals Tabs Take 1 tablet by mouth daily.   nitroGLYCERIN 0.4 MG SL tablet Commonly known as: NITROSTAT Place 1 tablet (0.4 mg total) under the tongue every 5 (five) minutes x 3 doses as needed for chest pain.   prednisoLONE acetate 1 % ophthalmic  suspension Commonly known as: PRED FORTE Administer 1 drop to the right eye two (2) times a day.    Relevant Imaging Results:  Relevant Lab Results:   Additional Information    Chapman Fitch, RN

## 2020-04-12 NOTE — Discharge Instructions (Signed)
Advised to follow-up with primary care physician in 1 week. Advised to follow-up with pulmonology as scheduled.

## 2020-04-12 NOTE — Progress Notes (Signed)
Mobility Specialist - Progress Note   04/12/20 1500  Mobility  Activity Refused mobility  Mobility performed by Mobility specialist    Pt politely declined mobility this date d/t wanting to rest. Pt anticipating d/c soon.    Filiberto Pinks Mobility Specialist 04/12/20, 3:29 PM

## 2020-04-12 NOTE — Progress Notes (Signed)
EM[facility] contacted. Patient is second on list. Report called to [facility] [place]. Spoke with [employer].    Madie Reno, RN

## 2020-04-12 NOTE — Discharge Summary (Addendum)
Physician Discharge Summary  Melvin Campbell ONG:[id number] DOB: 1940-05-08 DOA: 03/31/2020  PCP: Patient, No Pcp Per  Admit date: 03/31/2020.  Discharge date: 04/12/2020  Admitted From: Assisted Living Facility  Disposition:   Assisted Living facility  Recommendations for Outpatient Follow-up:  1. Follow up with PCP in 1-2 weeks. 2. Please obtain BMP/CBC in one week. 3. Follow up Pulmonology as scheduled.  Home Health: Yes. PT/OT/RN. Equipment/Devices: Oxygen 2-4 L/ m  Discharge Condition: Stable CODE STATUS:Full code Diet recommendation: Heart Healthy   Brief [facility] Course: This 80 years old male with PMH significant for HTN, Pulmonary Embolism with chronic hypoxic respiratory failure on 2.5 L of supplemental oxygen at baseline, cognitive impairment, early dementia, alcohol use disorder and depression presents from assisted living facility with worsening shortness of breath.  Patient has some chronic lung disease, He has been on oxygen since October 2021 because of PE.  In the ER Covid test negative,  CTA chest showed no PE but did show bibasilar atelectasis and pneumonia,  advanced emphysema.  Patient was admitted for acute on chronic hypoxic and hypercapnic respiratory failure could be secondary to unrecognized respiratory disease.  Pulmonology was consulted. Patient was started on antibiotics and steroids.  Patient has completed course of antibiotics and steroid,  feeling much better.  He is also found to have elevated troponins but denies any chest pain.  He was heparinized for 48 hours,  cardiology was consulted,  recommended defer left heart cath in the absence of chest pain and normal echocardiogram.  Patient was continued on Eliquis for PE.  Patient metoprolol dose was reduced because of bradycardia.  Has participated in physical therapy.  Home health services been arranged.  Patient was initially struggling with O2 saturation on ambulation.  Which has been improved and  patient is discharged back to assisted living.  He was managed for below problems.   Discharge Diagnoses:  Principal Problem:   Acute on chronic respiratory failure with hypoxia (HCC) Active Problems:   Elevated troponin   Community acquired pneumonia   Bilateral lower extremity edema  Acute on chronic hypoxic and hypercapnic respiratory failure : Patient has long history of unrecognized respiratory disease.   Patient's recent hospitalization for corneal ulcer was prolonged by hypoxia which resolved with diuresis. Since then, he has been on 2-3L O2 at ALF.  He was initially admitted for hypoxia,  CTA showed aspiration pneumonia.   He has completed 5 days antibioticswithout improvement in O2. He was later started on steroids, now completed 5 days, O2 improved considerably in the last few days with steroids and again Lasix. His O2 requirement weaned to 5L , still desaturating with ambulation here. Transitioned Solu-Medrol to prednisone ContinuePulmicort Continuescheduled bronchodilators for now, taper to PRN at discharge ContinueMetanebs, incentive spirometry and aggressive Chest PT He was given Lasix once.  BNP 17 - Patient needs O2 concentrator with higher O2 flow. -His O2 saturation has improved on 2.5 L at rest.  Elevated troponins: This could be secondary to demand ischemia. Troponins trending up, cardiology consulted. Patient denies any chest pain.  Patient was heparinized for 48 hours. Cardiology consulted, defer left heart cath in the absence of chest pain and normal echocardiogram.  History pulmonary embolism: Continue apixaban  Hypertension: Continue HCTZ, losartan, metoprolol  Bradycardia Asymptomatic, while sleeping.  Heart rates drops in the range 40- 50.  EKG shows sinus bradycardia. Metoprolol decreased from 75 to 50 mg daily.  Gout Denies Gout symptoms. -Continueallopurinol  Depression -Continue escitalopram  Dementia Patient has  some memory impairment, this appears to be worsened by hypoxia. It seems improved.  Moderate protein calorie malnutrition As evidenced by severe chronic illness, poor p.o. intake Phos, mag stable. -ContinueEnsure  Discharge Instructions  Discharge Instructions    Call MD for:  difficulty breathing, headache or visual disturbances   Complete by: As directed    Call MD for:  persistant dizziness or light-headedness   Complete by: As directed    Call MD for:  persistant nausea and vomiting   Complete by: As directed    Call MD for:  temperature >100.4   Complete by: As directed    Diet - low sodium heart healthy   Complete by: As directed    Diet Carb Modified   Complete by: As directed    Discharge instructions   Complete by: As directed    Advised to follow up PCP in one week. Advised to follow up cardiology as scheduled.   Discharge wound care:   Complete by: As directed    Follow up PCP   Increase activity slowly   Complete by: As directed      Allergies as of 04/12/2020      Reactions   Amlodipine       Medication List    TAKE these medications   acetaminophen 325 MG tablet Commonly known as: TYLENOL Take 650 mg by mouth every 4 (four) hours as needed.   albuterol 108 (90 Base) MCG/ACT inhaler Commonly known as: VENTOLIN HFA Inhale 2 puffs into the lungs every 4 (four) hours as needed.   allopurinol 300 MG tablet Commonly known as: ZYLOPRIM Take 300 mg by mouth daily.   Artificial Tears 1.4 % ophthalmic solution Generic drug: polyvinyl alcohol Place 1 drop into the right eye every 6 (six) hours.   aspirin 81 MG EC tablet Take 1 tablet (81 mg total) by mouth daily. Swallow whole. Start taking on: April 13, 2020   atorvastatin 80 MG tablet Commonly known as: LIPITOR Take 1 tablet (80 mg total) by mouth daily. Start taking on: April 13, 2020   B-1 100 MG Tabs Take 1 tablet by mouth daily.   budesonide 0.25 MG/2ML nebulizer  solution Commonly known as: PULMICORT Take 2 mLs (0.25 mg total) by nebulization 2 (two) times daily.   Eliquis 5 MG Tabs tablet Generic drug: apixaban Take 5 mg by mouth 2 (two) times daily.   erythromycin ophthalmic ointment Place 1 application into the right eye 3 (three) times daily.   escitalopram 20 MG tablet Commonly known as: LEXAPRO Take 20 mg by mouth daily.   folic acid 1 MG tablet Commonly known as: FOLVITE Take 1 mg by mouth daily.   furosemide 20 MG tablet Commonly known as: LASIX Take 20 mg by mouth daily as needed.   hydrochlorothiazide 25 MG tablet Commonly known as: HYDRODIURIL Take 25 mg by mouth daily.   losartan 100 MG tablet Commonly known as: COZAAR Take 100 mg by mouth daily.   magnesium oxide 400 (241.3 Mg) MG tablet Commonly known as: MAG-OX Take 1 tablet by mouth 2 (two) times daily.   metoprolol succinate 50 MG 24 hr tablet Commonly known as: TOPROL-XL Take 50 mg by mouth daily.   metoprolol succinate 25 MG 24 hr tablet Commonly known as: TOPROL-XL Take 25 mg by mouth daily.   Multi-Day Plus Minerals Tabs Take 1 tablet by mouth daily.   nitroGLYCERIN 0.4 MG SL tablet Commonly known as: NITROSTAT Place 1 tablet (0.4 mg total) under the  tongue every 5 (five) minutes x 3 doses as needed for chest pain.   prednisoLONE acetate 1 % ophthalmic suspension Commonly known as: PRED FORTE Administer 1 drop to the right eye two (2) times a day.            Durable Medical Equipment  (From admission, onward)         Start     Ordered   04/11/20 1634  For home use only DME Walker rolling  Once       Question Answer Comment  Walker: With 5 Inch Wheels   Patient needs a walker to treat with the following condition Generalized weakness      04/11/20 1633   04/11/20 1633  For home use only DME oxygen  Once       Question Answer Comment  Length of Need Lifetime   Mode or (Route) Nasal cannula   Liters per Minute 2   Frequency  Continuous (stationary and portable oxygen unit needed)   Oxygen conserving device Yes   Oxygen delivery system Gas      04/11/20 1632           Discharge Care Instructions  (From admission, onward)         Start     Ordered   04/12/20 0000  Discharge wound care:       Comments: Follow up PCP   04/12/20 1205          Follow-up Information    Vida RiggerAleskerov, Fuad, MD Follow up in 1 week(s).   Specialty: Pulmonary Disease Contact information: [address] [place] [address] [phone]              Allergies  Allergen Reactions  . Amlodipine     Consultations:  Pulmonology   Procedures/Studies: DG Chest 2 View  Result Date: 03/31/2020 CLINICAL DATA:  Shortness of breath. EXAM: CHEST - 2 VIEW COMPARISON:  None available. FINDINGS: Low lung volumes limit assessment. Heart size upper normal. Aortic atherosclerosis and tortuosity. There are streaky bibasilar opacities. Mild diffuse peribronchial thickening. No significant pleural effusion. No pneumothorax. Degenerative change throughout the spine. Right shoulder arthroplasty. No acute osseous abnormalities are seen. IMPRESSION: 1. Low lung volumes with streaky bibasilar opacities, favoring atelectasis. Mild diffuse peribronchial thickening. 2. Diffuse aortic atherosclerosis. Aortic tortuosity. Aortic Atherosclerosis (ICD10-I70.0). Electronically Signed   By: Narda RutherfordMelanie  Sanford M.D.   On: 03/31/2020 01:42   CT Angio Chest PE W/Cm &/Or Wo Cm  Result Date: 03/31/2020 CLINICAL DATA:  PE suspected, high prob PE in 01/2020; hypoxic EXAM: CT ANGIOGRAPHY CHEST WITH CONTRAST TECHNIQUE: Multidetector CT imaging of the chest was performed using the standard protocol during bolus administration of intravenous contrast. Multiplanar CT image reconstructions and MIPs were obtained to evaluate the vascular anatomy. CONTRAST:  100mL OMNIPAQUE IOHEXOL 350 MG/ML SOLN COMPARISON:  Chest radiograph earlier today.  No prior CT  available. FINDINGS: Cardiovascular: No evidence of acute or chronic pulmonary embolus. There are no intraluminal pulmonary arterial filling defects. No intraluminal webs. Dilated main pulmonary artery at 4 cm. Multi chamber cardiomegaly. Coronary artery calcifications. Aortic atherosclerosis and tortuosity. Cannot assess for dissection given phase of contrast tailored to pulmonary artery evaluation. No pericardial effusion. Mediastinum/Nodes: Prominent right lower paratracheal node measures 10 mm. 10 mm left hilar node. No visualized thyroid nodule. Patulous esophagus. Lungs/Pleura: Bibasilar and dependent right middle lobe consolidation. Bibasilar volume loss. Moderate emphysema. No septal thickening or pulmonary edema no significant pleural effusion. Upper Abdomen: Left perinephric edema is  partially included, nonspecific. Lobulated splenic contours. Musculoskeletal: Diffuse thoracic spondylosis. Subacute or remote lower lateral left rib fractures with callus formation. Right shoulder arthroplasty. Review of the MIP images confirms the above findings. IMPRESSION: 1. No pulmonary embolus. Cardiomegaly. Dilated main pulmonary artery consistent with pulmonary arterial hypertension. 2. Bibasilar and dependent right middle lobe consolidation with volume loss. Atelectasis is favored, however sterility is indeterminate by imaging, recommend correlation for pneumonia symptoms. 3. Nonspecific left perinephric edema in the upper abdomen is partially included, may be chronic or seen with urinary tract infection. 4. Aortic atherosclerosis and tortuosity. 5. Moderate emphysema Aortic Atherosclerosis (ICD10-I70.0) and Emphysema (ICD10-J43.9). Electronically Signed   By: Narda Rutherford M.D.   On: 03/31/2020 03:43   DG Chest Port 1 View  Result Date: 04/10/2020 CLINICAL DATA:  Acute on chronic respiratory failure with hypoxia. EXAM: PORTABLE CHEST 1 VIEW COMPARISON:  Chest radiograph April 03, 2020. CT chest March 31, 2020. FINDINGS: Low lung volumes. Elevated left hemidiaphragm. Similar left greater than right basilar opacities, better characterized on prior CT chest. No new consolidation. No visible pneumothorax. No sizable pleural effusions. Left costophrenic sulcus blunting is favored to relate to left lower lobe opacity (and not a pleural effusion) when correlating with prior CT chest. Cardiomediastinal silhouette is enlarged and accentuated by low lung volumes and portable AP technique. No acute osseous abnormality. Right reverse shoulder arthroplasty. IMPRESSION: 1. Similar appearance to priors with low lung volumes and left greater than right basilar opacities which are favored to represent atelectasis, although infection is not excluded. 2. Cardiomegaly. Electronically Signed   By: Feliberto Harts MD   On: 04/10/2020 13:35   DG Chest Port 1 View  Result Date: 04/03/2020 CLINICAL DATA:  Respiratory failure with hypoxia EXAM: PORTABLE CHEST 1 VIEW COMPARISON:  March 31, 2020 chest radiograph and chest CT FINDINGS: There is underlying parenchymal fibrotic type change. There are areas of scattered atelectatic change. There is no frank edema or consolidation. Heart is upper normal in size with pulmonary vascularity normal. No adenopathy. There is aortic atherosclerosis. 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 There is right carotid artery calcification. IMPRESSION: Underlying fibrosis and scattered areas of atelectasis. No edema or airspace opacity. Stable cardiac silhouette. Aortic Atherosclerosis (ICD10-I70.0). There is right carotid artery calcification. Electronically Signed   By: Bretta Bang III M.D.   On: 04/03/2020 12:19   ECHOCARDIOGRAM COMPLETE  Result Date: 04/01/2020    ECHOCARDIOGRAM REPORT   Patient Name:   Melvin Campbell Date of Exam: 04/01/2020 Medical Rec #:  [id number]        Height:       67.0 in Accession #:    [id number]       Weight:       187.0 lb Date of Birth:   [birth year]         BSA:          1.965 m Patient Age:    79 years         BP:           125/61 mmHg Patient Gender: M                HR:           58 bpm. Exam Location:  [facility] Procedure: 2D Echo, Cardiac Doppler and Color Doppler Indications:     NSTEMI I21.4  History:         Patient has no prior history of Echocardiogram examinations. No  medical history on file.  Sonographer:     Cristela Blue RDCS (AE) Referring Phys:  [id number] Vernetta Honey MANSY Diagnosing Phys: Lorine Bears MD  Sonographer Comments: Suboptimal apical window. IMPRESSIONS  1. Left ventricular ejection fraction, by estimation, is 60 to 65%. The left ventricle has normal function. The left ventricle has no regional wall motion abnormalities. There is moderate left ventricular hypertrophy. Left ventricular diastolic parameters are consistent with Grade II diastolic dysfunction (pseudonormalization).  2. Right ventricular systolic function is normal. The right ventricular size is normal. Tricuspid regurgitation signal is inadequate for assessing PA pressure.  3. Left atrial size was mildly dilated.  4. The mitral valve is normal in structure. No evidence of mitral valve regurgitation. No evidence of mitral stenosis. Moderate mitral annular calcification.  5. The aortic valve is normal in structure. Aortic valve regurgitation is not visualized. Mild aortic valve stenosis.  6. Aortic dilatation noted. There is mild dilatation of the ascending aorta, measuring 41 mm.  7. The inferior vena cava is dilated in size with <50% respiratory variability, suggesting right atrial pressure of 15 mmHg. FINDINGS  Left Ventricle: Left ventricular ejection fraction, by estimation, is 60 to 65%. The left ventricle has normal function. The left ventricle has no regional wall motion abnormalities. The left ventricular internal cavity size was normal in size. There is  moderate left ventricular hypertrophy. Left ventricular diastolic parameters are consistent with  Grade II diastolic dysfunction (pseudonormalization). Right Ventricle: The right ventricular size is normal. No increase in right ventricular wall thickness. Right ventricular systolic function is normal. Tricuspid regurgitation signal is inadequate for assessing PA pressure. Left Atrium: Left atrial size was mildly dilated. Right Atrium: Right atrial size was normal in size. Pericardium: There is no evidence of pericardial effusion. Mitral Valve: The mitral valve is normal in structure. Moderate mitral annular calcification. No evidence of mitral valve regurgitation. No evidence of mitral valve stenosis. Tricuspid Valve: The tricuspid valve is normal in structure. Tricuspid valve regurgitation is not demonstrated. No evidence of tricuspid stenosis. Aortic Valve: The aortic valve is normal in structure. Aortic valve regurgitation is not visualized. Mild aortic stenosis is present. Aortic valve mean gradient measures 4.5 mmHg. Aortic valve peak gradient measures 7.7 mmHg. Aortic valve area, by VTI measures 2.13 cm. Pulmonic Valve: The pulmonic valve was normal in structure. Pulmonic valve regurgitation is not visualized. No evidence of pulmonic stenosis. Aorta: The aortic root is normal in size and structure and aortic dilatation noted. There is mild dilatation of the ascending aorta, measuring 41 mm. Venous: The inferior vena cava is dilated in size with less than 50% respiratory variability, suggesting right atrial pressure of 15 mmHg. IAS/Shunts: No atrial level shunt detected by color flow Doppler.  LEFT VENTRICLE PLAX 2D LVIDd:         4.14 cm  Diastology LVIDs:         2.69 cm  LV e' medial:    4.24 cm/s LV PW:         1.14 cm  LV E/e' medial:  22.6 LV IVS:        1.16 cm  LV e' lateral:   4.57 cm/s LVOT diam:     2.20 cm  LV E/e' lateral: 21.0 LV SV:         73 LV SV Index:   37 LVOT Area:     3.80 cm  RIGHT VENTRICLE RV Basal diam:  3.37 cm RV S prime:     9.46 cm/s TAPSE (M-mode):  3.8 cm LEFT ATRIUM              Index       RIGHT ATRIUM           Index LA diam:        3.90 cm 1.98 cm/m  RA Area:     10.60 cm LA Vol (A2C):   78.4 ml 39.89 ml/m RA Volume:   20.70 ml  10.53 ml/m LA Vol (A4C):   70.5 ml 35.87 ml/m LA Biplane Vol: 79.9 ml 40.66 ml/m  AORTIC VALVE                    PULMONIC VALVE AV Area (Vmax):    2.13 cm     PV Vmax:        0.69 m/s AV Area (Vmean):   2.18 cm     PV Peak grad:   1.9 mmHg AV Area (VTI):     2.13 cm     RVOT Peak grad: 2 mmHg AV Vmax:           138.50 cm/s AV Vmean:          101.500 cm/s AV VTI:            0.342 m AV Peak Grad:      7.7 mmHg AV Mean Grad:      4.5 mmHg LVOT Vmax:         77.70 cm/s LVOT Vmean:        58.300 cm/s LVOT VTI:          0.192 m LVOT/AV VTI ratio: 0.56  AORTA Ao Root diam: 4.00 cm MITRAL VALVE                TRICUSPID VALVE MV Area (PHT): 3.95 cm     TR Peak grad:   8.4 mmHg MV Decel Time: 192 msec     TR Vmax:        145.00 cm/s MV E velocity: 96.00 cm/s MV A velocity: 113.00 cm/s  SHUNTS MV E/A ratio:  0.85         Systemic VTI:  0.19 m                             Systemic Diam: 2.20 cm Lorine Bears MD Electronically signed by Lorine Bears MD Signature Date/Time: 04/01/2020/10:36:14 AM    Final    Echocardiogram   Subjective: Patient was seen and examined at bedside.  Overnight events noted.  Patient reports feeling much better.  Patient is on 2 L of supplemental oxygen at baseline.  Patient has ambulated well.  Patient is being discharged home with home,  services been arranged.  Discharge Exam: Vitals:   04/12/20 0743 04/12/20 0954  BP:  122/70  Pulse: 62 73  Resp: 16 18  Temp:  98 F (36.7 C)  SpO2: (!) 89% 91%   Vitals:   04/12/20 0140 04/12/20 0354 04/12/20 0743 04/12/20 0954  BP:  122/68  122/70  Pulse:  64 62 73  Resp:  Temp:  98.9 F (37.2 C)  98 F (36.7 C)  TempSrc:  Oral  Oral  SpO2: 95%  (!) 89% 91%  Weight:      Height:        General: Pt is alert, awake, not in acute distress Cardiovascular:  RRR, S1/S2 +, no rubs, no gallops Respiratory: CTA bilaterally, no wheezing, no  rhonchi Abdominal: Soft, NT, ND, bowel sounds + Extremities: no edema, no cyanosis    The results of significant diagnostics from this hospitalization (including imaging, microbiology, ancillary and laboratory) are listed below for reference.     Microbiology: Recent Results (from the past 240 hour(s))  Resp Panel by RT-PCR (Flu A&B, Covid) Nasopharyngeal Swab     Status: None   Collection Time: 04/12/20 12:52 PM   Specimen: Nasopharyngeal Swab; Nasopharyngeal(NP) swabs in vial transport medium  Result Value Ref Range Status   SARS Coronavirus 2 by RT PCR NEGATIVE NEGATIVE Final    Comment: (NOTE) SARS-CoV-2 target nucleic acids are NOT DETECTED.  The SARS-CoV-2 RNA is generally detectable in upper respiratory specimens during the acute phase of infection. The lowest concentration of SARS-CoV-2 viral copies this assay can detect is 138 copies/mL. A negative result does not preclude SARS-Cov-2 infection and should not be used as the sole basis for treatment or other patient management decisions. A negative result may occur with  improper specimen collection/handling, submission of specimen other than nasopharyngeal swab, presence of viral mutation(s) within the areas targeted by this assay, and inadequate number of viral copies(<138 copies/mL). A negative result must be combined with clinical observations, patient history, and epidemiological information. The expected result is Negative.  Fact Sheet for Patients:  BloggerCourse.com  Fact Sheet for Healthcare Providers:  SeriousBroker.it  This test is no t yet approved or cleared by the [place] FDA and  has been authorized for detection and/or diagnosis of SARS-CoV-2 by FDA under an Emergency Use Authorization (EUA). This EUA will remain  in effect (meaning this test can be used) for the  duration of the COVID-19 declaration under Section 564(b)(1) of the Act, 21 U.S.C.section 360bbb-3(b)(1), unless the authorization is terminated  or revoked sooner.       Influenza A by PCR NEGATIVE NEGATIVE Final   Influenza B by PCR NEGATIVE NEGATIVE Final    Comment: (NOTE) The Xpert Xpress SARS-CoV-2/FLU/RSV plus assay is intended as an aid in the diagnosis of influenza from Nasopharyngeal swab specimens and should not be used as a sole basis for treatment. Nasal washings and aspirates are unacceptable for Xpert Xpress SARS-CoV-2/FLU/RSV testing.  Fact Sheet for Patients: BloggerCourse.com  Fact Sheet for Healthcare Providers: SeriousBroker.it  This test is not yet approved or cleared by the [place] FDA and has been authorized for detection and/or diagnosis of SARS-CoV-2 by FDA under an Emergency Use Authorization (EUA). This EUA will remain in effect (meaning this test can be used) for the duration of the COVID-19 declaration under Section 564(b)(1) of the Act, 21 U.S.C. section 360bbb-3(b)(1), unless the authorization is terminated or revoked.  Performed at [facility], [address] [place], [place] [address]      Labs: BNP (last 3 results) Recent Labs    03/31/20 0104 04/08/20 0515  BNP 23.2 172.8*   Basic Metabolic Panel: Recent Labs  Lab 04/06/20 0554 04/07/20 0508 04/08/20 0515 04/09/20 0441 04/10/20 0400 04/11/20 0411 04/12/20 0415  NA 139 137 138 138  --  138  --   K 3.9 4.0 3.8 3.9  --  4.1  --   CL 87* 86* 86* 89*  --  92*  --   CO2 41* 40* 44* 40*  --  38*  --   GLUCOSE 93 96 88 98  --  68*  --   BUN 32* 30* 35* 29*  --  27*  --   CREATININE 0.69 0.52* 0.65 0.49*  --  0.55*  --   CALCIUM 10.1 10.0 10.0 9.5  --  9.4  --   MG 1.9 1.8 1.7 1.9 2.0 1.9 1.8  PHOS 3.4 3.4 1.8* 3.6 3.2 3.9 3.7   Liver Function Tests: No results for input(s): AST, ALT, ALKPHOS, BILITOT, PROT,  ALBUMIN in the last 168 hours. No results for input(s): LIPASE, AMYLASE in the last 168 hours. No results for input(s): AMMONIA in the last 168 hours. CBC: Recent Labs  Lab 04/08/20 0515 04/11/20 0411  WBC 11.7*  --   HGB 14.3 14.0  HCT 48.3 48.2  MCV 84.9  --   PLT 157  --    Cardiac Enzymes: No results for input(s): CKTOTAL, CKMB, CKMBINDEX, TROPONINI in the last 168 hours. BNP: Invalid input(s): POCBNP CBG: No results for input(s): GLUCAP in the last 168 hours. D-Dimer No results for input(s): DDIMER in the last 72 hours. Hgb A1c No results for input(s): HGBA1C in the last 72 hours. Lipid Profile No results for input(s): CHOL, HDL, LDLCALC, TRIG, CHOLHDL, LDLDIRECT in the last 72 hours. Thyroid function studies No results for input(s): TSH, T4TOTAL, T3FREE, THYROIDAB in the last 72 hours.  Invalid input(s): FREET3 Anemia work up No results for input(s): VITAMINB12, FOLATE, FERRITIN, TIBC, IRON, RETICCTPCT in the last 72 hours. Urinalysis No results found for: COLORURINE, APPEARANCEUR, LABSPEC, PHURINE, GLUCOSEU, HGBUR, BILIRUBINUR, KETONESUR, PROTEINUR, UROBILINOGEN, NITRITE, LEUKOCYTESUR Sepsis Labs Invalid input(s): PROCALCITONIN,  WBC,  LACTICIDVEN Microbiology Recent Results (from the past 240 hour(s))  Resp Panel by RT-PCR (Flu A&B, Covid) Nasopharyngeal Swab     Status: None   Collection Time: 04/12/20 12:52 PM   Specimen: Nasopharyngeal Swab; Nasopharyngeal(NP) swabs in vial transport medium  Result Value Ref Range Status   SARS Coronavirus 2 by RT PCR NEGATIVE NEGATIVE Final    Comment: (NOTE) SARS-CoV-2 target nucleic acids are NOT DETECTED.  The SARS-CoV-2 RNA is generally detectable in upper respiratory specimens during the acute phase of infection. The lowest concentration of SARS-CoV-2 viral copies this assay can detect is 138 copies/mL. A negative result does not preclude SARS-Cov-2 infection and should not be used as the sole basis for treatment  or other patient management decisions. A negative result may occur with  improper specimen collection/handling, submission of specimen other than nasopharyngeal swab, presence of viral mutation(s) within the areas targeted by this assay, and inadequate number of viral copies(<138 copies/mL). A negative result must be combined with clinical observations, patient history, and epidemiological information. The expected result is Negative.  Fact Sheet for Patients:  BloggerCourse.com  Fact Sheet for Healthcare Providers:  SeriousBroker.it  This test is no t yet approved or cleared by the [place] FDA and  has been authorized for detection and/or diagnosis of SARS-CoV-2 by FDA under an Emergency Use Authorization (EUA). This EUA will remain  in effect (meaning this test can be used) for the duration of the COVID-19 declaration under Section 564(b)(1) of the Act, 21 U.S.C.section 360bbb-3(b)(1), unless the authorization is terminated  or revoked sooner.       Influenza A by PCR NEGATIVE NEGATIVE Final   Influenza B by PCR NEGATIVE NEGATIVE Final    Comment: (NOTE) The Xpert Xpress SARS-CoV-2/FLU/RSV plus assay is intended as an aid in the diagnosis of influenza from Nasopharyngeal swab specimens and should not be used as a sole basis for treatment. Nasal washings and aspirates are unacceptable for Xpert Xpress SARS-CoV-2/FLU/RSV testing.  Fact Sheet for Patients: BloggerCourse.com  Fact Sheet for Healthcare Providers: SeriousBroker.it  This  test is not yet approved or cleared by the [place] and has been authorized for detection and/or diagnosis of SARS-CoV-2 by FDA under an Emergency Use Authorization (EUA). This EUA will remain in effect (meaning this test can be used) for the duration of the COVID-19 declaration under Section 564(b)(1) of the Act, 21 U.S.C. section  360bbb-3(b)(1), unless the authorization is terminated or revoked.  Performed at [facility], [address] [place], [place] [address]      Time coordinating discharge: Over 30 minutes  SIGNED:   Cipriano Bunker, MD  [facility] 04/12/2020, 2:10 PM Pager   If 7PM-7AM, please contact night-coverage [email or web address]

## 2020-04-12 NOTE — TOC Transition Note (Addendum)
Transition of Care [facility]) - CM/SW Discharge Note   Patient Details  Name: Melvin Campbell MRN: [id number] Date of Birth: 1940-03-21  Transition of [facility] CM/SW Contact:  Chapman Fitch, RN Phone Number: 04/12/2020, 3:54 PM   Clinical Narrative:     Patient to return to the [facility] today Darien Ramus at the [employer] faxed   Daughter updated Bedside RN to call report EMS packet on the chart EMS transport called   Cala Bradford with [facility] home [facility] notified   Final next level of care: Assisted Living     Patient Goals and CMS Choice        Discharge Placement                       Discharge Plan and Services                                     Social Determinants of Health (SDOH) Interventions     Readmission Risk Interventions No flowsheet data found.

## 2020-05-25 ENCOUNTER — Inpatient Hospital Stay
Admission: EM | Admit: 2020-05-25 | Discharge: 2020-05-29 | DRG: 311 | Disposition: A | Discharge status: Nursing Home (Includes ICF) | Payer: Medicare Other | Source: Skilled Nursing Facility | Attending: Internal Medicine | Admitting: Internal Medicine

## 2020-05-25 ENCOUNTER — Emergency Department: Payer: Medicare Other

## 2020-05-25 ENCOUNTER — Other Ambulatory Visit: Payer: Self-pay

## 2020-05-25 DIAGNOSIS — Z7951 Long term (current) use of inhaled steroids: Secondary | ICD-10-CM

## 2020-05-25 DIAGNOSIS — Z8249 Family history of ischemic heart disease and other diseases of the circulatory system: Secondary | ICD-10-CM

## 2020-05-25 DIAGNOSIS — I10 Essential (primary) hypertension: Secondary | ICD-10-CM

## 2020-05-25 DIAGNOSIS — Z7901 Long term (current) use of anticoagulants: Secondary | ICD-10-CM

## 2020-05-25 DIAGNOSIS — R7989 Other specified abnormal findings of blood chemistry: Secondary | ICD-10-CM

## 2020-05-25 DIAGNOSIS — I4891 Unspecified atrial fibrillation: Secondary | ICD-10-CM | POA: Diagnosis not present

## 2020-05-25 DIAGNOSIS — J9811 Atelectasis: Secondary | ICD-10-CM | POA: Diagnosis present

## 2020-05-25 DIAGNOSIS — J9621 Acute and chronic respiratory failure with hypoxia: Secondary | ICD-10-CM | POA: Diagnosis present

## 2020-05-25 DIAGNOSIS — I249 Acute ischemic heart disease, unspecified: Principal | ICD-10-CM | POA: Diagnosis present

## 2020-05-25 DIAGNOSIS — Z66 Do not resuscitate: Secondary | ICD-10-CM | POA: Diagnosis present

## 2020-05-25 DIAGNOSIS — Z20822 Contact with and (suspected) exposure to covid-19: Secondary | ICD-10-CM | POA: Diagnosis present

## 2020-05-25 DIAGNOSIS — R0902 Hypoxemia: Secondary | ICD-10-CM

## 2020-05-25 DIAGNOSIS — Z9981 Dependence on supplemental oxygen: Secondary | ICD-10-CM

## 2020-05-25 DIAGNOSIS — R778 Other specified abnormalities of plasma proteins: Secondary | ICD-10-CM | POA: Diagnosis not present

## 2020-05-25 DIAGNOSIS — R0602 Shortness of breath: Secondary | ICD-10-CM | POA: Diagnosis not present

## 2020-05-25 DIAGNOSIS — R0600 Dyspnea, unspecified: Secondary | ICD-10-CM | POA: Diagnosis not present

## 2020-05-25 DIAGNOSIS — J449 Chronic obstructive pulmonary disease, unspecified: Secondary | ICD-10-CM | POA: Diagnosis present

## 2020-05-25 DIAGNOSIS — I110 Hypertensive heart disease with heart failure: Secondary | ICD-10-CM | POA: Diagnosis present

## 2020-05-25 DIAGNOSIS — F32A Depression, unspecified: Secondary | ICD-10-CM | POA: Diagnosis present

## 2020-05-25 DIAGNOSIS — Z823 Family history of stroke: Secondary | ICD-10-CM

## 2020-05-25 DIAGNOSIS — E785 Hyperlipidemia, unspecified: Secondary | ICD-10-CM | POA: Diagnosis present

## 2020-05-25 DIAGNOSIS — Z7982 Long term (current) use of aspirin: Secondary | ICD-10-CM

## 2020-05-25 DIAGNOSIS — Z888 Allergy status to other drugs, medicaments and biological substances status: Secondary | ICD-10-CM

## 2020-05-25 DIAGNOSIS — F0390 Unspecified dementia without behavioral disturbance: Secondary | ICD-10-CM | POA: Diagnosis present

## 2020-05-25 DIAGNOSIS — Z86711 Personal history of pulmonary embolism: Secondary | ICD-10-CM

## 2020-05-25 DIAGNOSIS — M109 Gout, unspecified: Secondary | ICD-10-CM | POA: Diagnosis present

## 2020-05-25 DIAGNOSIS — I5031 Acute diastolic (congestive) heart failure: Secondary | ICD-10-CM | POA: Diagnosis present

## 2020-05-25 DIAGNOSIS — Z79899 Other long term (current) drug therapy: Secondary | ICD-10-CM

## 2020-05-25 LAB — BASIC METABOLIC PANEL
Anion gap: 5 (ref 5–15)
BUN: 15 mg/dL (ref 8–23)
CO2: 42 mmol/L — ABNORMAL HIGH (ref 22–32)
Calcium: 9.6 mg/dL (ref 8.9–10.3)
Chloride: 96 mmol/L — ABNORMAL LOW (ref 98–111)
Creatinine, Ser: 0.54 mg/dL — ABNORMAL LOW (ref 0.61–1.24)
GFR, Estimated: >60 mL/min (ref >60)
Glucose, Bld: 114 mg/dL — ABNORMAL HIGH (ref 70–99)
Potassium: 3.7 mmol/L (ref 3.5–5.1)
Sodium: 143 mmol/L (ref 135–145)

## 2020-05-25 LAB — CBC WITH DIFFERENTIAL/PLATELET
Abs Immature Granulocytes: 0.02 10*3/uL (ref 0.00–0.07)
Basophils Absolute: 0 10*3/uL (ref 0.0–0.1)
Basophils Relative: 0 %
Eosinophils Absolute: 0.2 10*3/uL (ref 0.0–0.5)
Eosinophils Relative: 2 %
HCT: 49.5 % (ref 39.0–52.0)
Hemoglobin: 14.3 g/dL (ref 13.0–17.0)
Immature Granulocytes: 0 %
Lymphocytes Relative: 22 %
Lymphs Abs: 1.4 10*3/uL (ref 0.7–4.0)
MCH: 24.9 pg — ABNORMAL LOW (ref 26.0–34.0)
MCHC: 28.9 g/dL — ABNORMAL LOW (ref 30.0–36.0)
MCV: 86.2 fL (ref 80.0–100.0)
Monocytes Absolute: 0.5 10*3/uL (ref 0.1–1.0)
Monocytes Relative: 7 %
Neutro Abs: 4.4 10*3/uL (ref 1.7–7.7)
Neutrophils Relative %: 69 %
Platelets: 158 10*3/uL (ref 150–400)
RBC: 5.74 MIL/uL (ref 4.22–5.81)
RDW: 15.6 % — ABNORMAL HIGH (ref 11.5–15.5)
WBC: 6.5 10*3/uL (ref 4.0–10.5)
nRBC: 0 % (ref 0.0–0.2)

## 2020-05-25 LAB — RESP PANEL BY RT-PCR (FLU A&B, COVID) ARPGX2
Influenza A by PCR: NEGATIVE
Influenza B by PCR: NEGATIVE
SARS Coronavirus 2 by RT PCR: NEGATIVE

## 2020-05-25 LAB — TROPONIN I (HIGH SENSITIVITY): Troponin I (High Sensitivity): 220 ng/L (ref <18)

## 2020-05-25 MED ORDER — ONDANSETRON HCL 4 MG/2ML IJ SOLN: 4.0000 mg | Freq: Four times a day (QID) | INTRAMUSCULAR | Status: DC | PRN

## 2020-05-25 MED ORDER — IOHEXOL 350 MG/ML SOLN
75.0000 mL | Freq: Once | INTRAVENOUS | Status: AC | PRN
  Administered 2020-05-25: 75 mL via INTRAVENOUS

## 2020-05-25 MED ORDER — ALLOPURINOL 300 MG PO TABS
300.0000 mg | ORAL_TABLET | Freq: Every day | ORAL | Status: DC
  Administered 2020-05-26 – 2020-05-29 (×4): 300 mg via ORAL
  Filled 2020-05-25 (×4): qty 1

## 2020-05-25 MED ORDER — ONDANSETRON HCL 4 MG PO TABS: 4.0000 mg | ORAL_TABLET | Freq: Four times a day (QID) | ORAL | Status: DC | PRN

## 2020-05-25 MED ORDER — ACETAMINOPHEN 325 MG PO TABS
650.0000 mg | ORAL_TABLET | Freq: Four times a day (QID) | ORAL | Status: DC | PRN
  Administered 2020-05-26: 650 mg via ORAL
  Filled 2020-05-25 (×2): qty 2

## 2020-05-25 MED ORDER — FOLIC ACID 1 MG PO TABS
1.0000 mg | ORAL_TABLET | Freq: Every day | ORAL | Status: DC
  Administered 2020-05-26 – 2020-05-29 (×4): 1 mg via ORAL
  Filled 2020-05-25 (×4): qty 1

## 2020-05-25 MED ORDER — FUROSEMIDE 20 MG PO TABS: 20.0000 mg | ORAL_TABLET | Freq: Every day | ORAL | Status: DC | PRN

## 2020-05-25 MED ORDER — TRAZODONE HCL 50 MG PO TABS: 25.0000 mg | ORAL_TABLET | Freq: Every evening | ORAL | Status: DC | PRN

## 2020-05-25 MED ORDER — ENOXAPARIN SODIUM 40 MG/0.4ML ~~LOC~~ SOLN: 40.0000 mg | SUBCUTANEOUS | Status: DC

## 2020-05-25 MED ORDER — ERYTHROMYCIN 5 MG/GM OP OINT
1.0000 "application " | TOPICAL_OINTMENT | Freq: Three times a day (TID) | OPHTHALMIC | Status: DC
  Administered 2020-05-26 – 2020-05-29 (×10): 1 via OPHTHALMIC
  Filled 2020-05-25 (×2): qty 1

## 2020-05-25 MED ORDER — HYDROCHLOROTHIAZIDE 25 MG PO TABS
25.0000 mg | ORAL_TABLET | Freq: Every day | ORAL | Status: DC
  Administered 2020-05-26: 25 mg via ORAL
  Filled 2020-05-25: qty 1

## 2020-05-25 MED ORDER — ATORVASTATIN CALCIUM 80 MG PO TABS
80.0000 mg | ORAL_TABLET | Freq: Every day | ORAL | Status: DC
  Administered 2020-05-26 – 2020-05-28 (×3): 80 mg via ORAL
  Filled 2020-05-25 (×3): qty 1

## 2020-05-25 MED ORDER — ASPIRIN EC 81 MG PO TBEC
81.0000 mg | DELAYED_RELEASE_TABLET | Freq: Every day | ORAL | Status: DC
  Administered 2020-05-26 – 2020-05-29 (×4): 81 mg via ORAL
  Filled 2020-05-25 (×4): qty 1

## 2020-05-25 MED ORDER — ADULT MULTIVITAMIN W/MINERALS CH
1.0000 | ORAL_TABLET | Freq: Every day | ORAL | Status: DC
  Administered 2020-05-26 – 2020-05-29 (×4): 1 via ORAL
  Filled 2020-05-25 (×4): qty 1

## 2020-05-25 MED ORDER — QUETIAPINE FUMARATE 25 MG PO TABS
25.0000 mg | ORAL_TABLET | Freq: Every day | ORAL | Status: DC
  Administered 2020-05-26 – 2020-05-28 (×3): 25 mg via ORAL
  Filled 2020-05-25 (×3): qty 1

## 2020-05-25 MED ORDER — METHYLPREDNISOLONE SODIUM SUCC 125 MG IJ SOLR
125.0000 mg | Freq: Once | INTRAMUSCULAR | Status: AC
  Administered 2020-05-26: 125 mg via INTRAVENOUS
  Filled 2020-05-25: qty 2

## 2020-05-25 MED ORDER — IPRATROPIUM-ALBUTEROL 0.5-2.5 (3) MG/3ML IN SOLN
3.0000 mL | Freq: Once | RESPIRATORY_TRACT | Status: AC
  Administered 2020-05-26: 3 mL via RESPIRATORY_TRACT
  Filled 2020-05-25: qty 3

## 2020-05-25 MED ORDER — NITROGLYCERIN 0.4 MG SL SUBL: 0.4000 mg | SUBLINGUAL_TABLET | SUBLINGUAL | Status: DC | PRN

## 2020-05-25 MED ORDER — LOSARTAN POTASSIUM 50 MG PO TABS
100.0000 mg | ORAL_TABLET | Freq: Every day | ORAL | Status: DC
  Administered 2020-05-26 – 2020-05-29 (×4): 100 mg via ORAL
  Filled 2020-05-25 (×4): qty 2

## 2020-05-25 MED ORDER — DONEPEZIL HCL 5 MG PO TABS
5.0000 mg | ORAL_TABLET | Freq: Every day | ORAL | Status: DC
  Administered 2020-05-26 – 2020-05-29 (×4): 5 mg via ORAL
  Filled 2020-05-25 (×4): qty 1

## 2020-05-25 MED ORDER — ASPIRIN 81 MG PO CHEW
324.0000 mg | CHEWABLE_TABLET | Freq: Once | ORAL | Status: AC
  Administered 2020-05-26: 324 mg via ORAL
  Filled 2020-05-25: qty 4

## 2020-05-25 MED ORDER — METOPROLOL SUCCINATE ER 50 MG PO TB24
75.0000 mg | ORAL_TABLET | Freq: Every day | ORAL | Status: DC
  Administered 2020-05-26 – 2020-05-29 (×4): 75 mg via ORAL
  Filled 2020-05-25 (×4): qty 1

## 2020-05-25 MED ORDER — METOPROLOL SUCCINATE ER 50 MG PO TB24: 25.0000 mg | ORAL_TABLET | Freq: Every day | ORAL | Status: DC

## 2020-05-25 MED ORDER — POLYVINYL ALCOHOL 1.4 % OP SOLN
1.0000 [drp] | Freq: Four times a day (QID) | OPHTHALMIC | Status: DC
  Administered 2020-05-26 – 2020-05-29 (×13): 1 [drp] via OPHTHALMIC
  Filled 2020-05-25 (×2): qty 15

## 2020-05-25 MED ORDER — MAGNESIUM OXIDE 400 (241.3 MG) MG PO TABS
400.0000 mg | ORAL_TABLET | Freq: Two times a day (BID) | ORAL | Status: DC
  Administered 2020-05-26 – 2020-05-29 (×7): 400 mg via ORAL
  Filled 2020-05-25 (×7): qty 1

## 2020-05-25 MED ORDER — SODIUM CHLORIDE 0.9 % IV SOLN: INTRAVENOUS | Status: DC

## 2020-05-25 MED ORDER — ACETAMINOPHEN 650 MG RE SUPP: 650.0000 mg | Freq: Four times a day (QID) | RECTAL | Status: DC | PRN

## 2020-05-25 MED ORDER — THIAMINE HCL 100 MG PO TABS
100.0000 mg | ORAL_TABLET | Freq: Every day | ORAL | Status: DC
  Administered 2020-05-26 – 2020-05-29 (×4): 100 mg via ORAL
  Filled 2020-05-25 (×4): qty 1

## 2020-05-25 MED ORDER — PREDNISOLONE ACETATE 1 % OP SUSP
1.0000 [drp] | Freq: Two times a day (BID) | OPHTHALMIC | Status: DC
  Administered 2020-05-26 – 2020-05-29 (×7): 1 [drp] via OPHTHALMIC
  Filled 2020-05-25 (×2): qty 1

## 2020-05-25 MED ORDER — APIXABAN 5 MG PO TABS: 5.0000 mg | ORAL_TABLET | Freq: Two times a day (BID) | ORAL | Status: DC

## 2020-05-25 MED ORDER — BUDESONIDE 0.25 MG/2ML IN SUSP
0.2500 mg | Freq: Two times a day (BID) | RESPIRATORY_TRACT | Status: DC
  Administered 2020-05-26 – 2020-05-29 (×7): 0.25 mg via RESPIRATORY_TRACT
  Filled 2020-05-25 (×7): qty 2

## 2020-05-25 MED ORDER — MAGNESIUM HYDROXIDE 400 MG/5ML PO SUSP: 30.0000 mL | Freq: Every day | ORAL | Status: DC | PRN

## 2020-05-25 MED ORDER — ESCITALOPRAM OXALATE 10 MG PO TABS
20.0000 mg | ORAL_TABLET | Freq: Every day | ORAL | Status: DC
  Administered 2020-05-26 – 2020-05-29 (×4): 20 mg via ORAL
  Filled 2020-05-25 (×4): qty 2

## 2020-05-25 NOTE — H&P (Addendum)
[place]   PATIENT NAME: Melvin Campbell    MR#:  [id number]  DATE OF BIRTH:  [birth year]  DATE OF ADMISSION:  05/25/2020  PRIMARY CARE PHYSICIAN: Patient, No Pcp Per (Inactive)   Patient is coming from: [facility].  REQUESTING/REFERRING PHYSICIAN: Phineas Semen, MD  CHIEF COMPLAINT:   Chief Complaint  Patient presents with  . Shortness of Breath    HISTORY OF PRESENT ILLNESS:  Melvin Campbell is a 80 y.o. Caucasian male with medical history significant for hypertension, dyslipidemia and PE with chronic respiratory failure on home O2 at 2.5 L/min by nasal cannula, and dementia who presented to the emergency room with acute onset of worsening dyspnea at his skilled nursing facility with associated hypoxemia with a pulse oximetry of mid to high 80s on 3 L of O2 by nasal cannula.  He denied any cough or wheezing, chest pain or palpitations.  No fever or chills.  No nausea or vomiting or abdominal pain.  No dysuria, oliguria or hematuria or flank pain.  The patient had a similar presentation a couple months ago when he was admitted here and his troponin at that time was over 1200.  He was managed conservatively at this time given the lack of chest pain.  When I went into his room, patient had already pulled out his IV, oxygen and monitor leads and was walking in the room trying to get out.  He seemed fairly confused initially however later he was returned to bed and was alert and oriented to his name, place and only the year but not to the day or the month.  ED Course: Upon presentation to the ER, blood pressure was 152/77 with respiratory rate of 28 and pulse oximetry of 84% on 3 L of O2 by nasal cannula and 93% on 6 L of O2 by nasal cannula.  Labs revealed unremarkable BMP and CBC.  High-sensitivity troponin I was 220.  COVID-19 PCR and influenza antigens were negative.  EKG as reviewed by me : Showed sinus rhythm with rate of 60 with suspected left bundle branch block and  prolonged PR interval. Imaging:  Two-view chest x-ray showed low volume with diffuse bilateral interstitial pulmonary opacity and atelectasis or consolidation at the lung bases similar in appearance to prior examination with differential diagnosis including edema and/or chronic interstitial change with no new or focal airspace opacity.  Chest CTA revealed: 1. Negative examination for pulmonary embolism. 2. Small left pleural effusion. 3. Low pulmonary volumes with extensive dependent bibasilar atelectasis and/or consolidation, similar in appearance to prior examination. 4. Emphysema. 5. Coronary artery disease. 6. Aortic Atherosclerosis.  The patient was given duo nebs and 4 baby aspirin.  He will be admitted to an observation progressive cardiac unit bed for further evaluation and management. PAST MEDICAL HISTORY:  Hypertension, dyslipidemia, gout and PE with chronic respiratory failure on home O2 and dementia.  PAST SURGICAL HISTORY:  Cholecystectomy  SOCIAL HISTORY:   Social History   Tobacco Use  . Smoking status: Never Smoker  . Smokeless tobacco: Never Used  Substance Use Topics  . Alcohol use: Not on file    FAMILY HISTORY:  Positive for coronary artery disease, hypertension and CVA.  DRUG ALLERGIES:   Allergies  Allergen Reactions  . Amlodipine     REVIEW OF SYSTEMS:   ROS As per history of present illness. All pertinent systems were reviewed above. Constitutional, HEENT, cardiovascular, respiratory, GI, GU, musculoskeletal, neuro, psychiatric, endocrine, integumentary and hematologic  systems were reviewed and are otherwise negative/unremarkable except for positive findings mentioned above in the HPI.   MEDICATIONS AT HOME:   Prior to Admission medications   Medication Sig Start Date End Date Taking? Authorizing Provider  acetaminophen (TYLENOL) 325 MG tablet Take 650 mg by mouth every 4 (four) hours as needed. 12/31/19  Yes [provider]   albuterol (VENTOLIN HFA) 108 (90 Base) MCG/ACT inhaler Inhale 2 puffs into the lungs every 4 (four) hours as needed. 01/02/20  Yes [provider]  allopurinol (ZYLOPRIM) 300 MG tablet Take 300 mg by mouth daily. 03/23/20  Yes [provider]  ARTIFICIAL TEARS 1.4 % ophthalmic solution Place 1 drop into the right eye every 6 (six) hours. 03/08/20  Yes [provider]  aspirin 81 MG EC tablet Take 1 tablet (81 mg total) by mouth daily. Swallow whole. 04/13/20  Yes Cipriano Bunker, MD  atorvastatin (LIPITOR) 80 MG tablet Take 1 tablet (80 mg total) by mouth daily. 04/13/20  Yes Cipriano Bunker, MD  budesonide (PULMICORT) 0.25 MG/2ML nebulizer solution Take 2 mLs (0.25 mg total) by nebulization 2 (two) times daily. 04/12/20  Yes Cipriano Bunker, MD  donepezil (ARICEPT) 5 MG tablet Take 5 mg by mouth daily. 05/19/20  Yes [provider]  ELIQUIS 5 MG TABS tablet Take 5 mg by mouth 2 (two) times daily. 03/23/20  Yes [provider]  erythromycin ophthalmic ointment Place 1 application into the right eye 3 (three) times daily. 02/28/20  Yes [provider]  escitalopram (LEXAPRO) 20 MG tablet Take 20 mg by mouth daily. 03/23/20  Yes [provider]  folic acid (FOLVITE) 1 MG tablet Take 1 mg by mouth daily. 03/23/20  Yes [provider]  furosemide (LASIX) 20 MG tablet Take 20 mg by mouth daily as needed. 11/28/19  Yes [provider]  hydrochlorothiazide (HYDRODIURIL) 25 MG tablet Take 25 mg by mouth daily. 03/23/20  Yes [provider]  losartan (COZAAR) 100 MG tablet Take 100 mg by mouth daily. 03/23/20  Yes [provider]  magnesium oxide (MAG-OX) 400 (241.3 Mg) MG tablet Take 1 tablet by mouth 2 (two) times daily. 03/23/20  Yes [provider]  metoprolol succinate (TOPROL-XL) 25 MG 24 hr tablet Take 25 mg by mouth daily. 03/25/20  Yes [provider]  metoprolol succinate (TOPROL-XL) 50 MG 24 hr tablet Take  50 mg by mouth daily. 03/23/20  Yes [provider]  Multiple Vitamins-Minerals (MULTI-DAY PLUS MINERALS) TABS Take 1 tablet by mouth daily. 03/23/20  Yes [provider]  nitroGLYCERIN (NITROSTAT) 0.4 MG SL tablet Place 1 tablet (0.4 mg total) under the tongue every 5 (five) minutes x 3 doses as needed for chest pain. 04/12/20  Yes Cipriano Bunker, MD  prednisoLONE acetate (PRED FORTE) 1 % ophthalmic suspension Administer 1 drop to the right eye two (2) times a day. 02/28/20  Yes [provider]  QUEtiapine (SEROQUEL) 25 MG tablet Take 25 mg by mouth at bedtime. 05/19/20  Yes [provider]  Thiamine HCl (B-1) 100 MG TABS Take 1 tablet by mouth daily. 03/23/20  Yes [provider]      VITAL SIGNS:  Blood pressure 129/63, pulse (!) 58, temperature 98.1 F (36.7 C), temperature source Oral, resp. rate (!) 22, height 5\' 7"  (1.702 m), weight 82 kg, SpO2 95 %.  PHYSICAL EXAMINATION:  Physical Exam  GENERAL:  80 y.o.-year-old Caucasian male patient who was initially walking then lying in the bed with mild  respiratory distress with conversational dyspnea.Marland Kitchen.  He was initially confused and later alert and oriented to his name, place and only the year but not to the day or the month. EYES: Pupils equal, round, reactive to light and accommodation. No scleral icterus. Extraocular muscles intact.  HEENT: Head atraumatic, normocephalic. Oropharynx and nasopharynx clear.  NECK:  Supple, no jugular venous distention. No thyroid enlargement, no tenderness.  LUNGS: Slightly diminished bibasal breath sounds.  No wheezing, rales,rhonchi or crepitation. No use of accessory muscles of respiration.  CARDIOVASCULAR: Regular rate and rhythm, S1, S2 normal. No murmurs, rubs, or gallops.  ABDOMEN: Soft, nondistended, nontender. Bowel sounds present. No organomegaly or mass.  EXTREMITIES: No pedal edema, cyanosis, or clubbing.  NEUROLOGIC: Cranial nerves II through XII are intact.  Muscle strength 5/5 in all extremities. Sensation intact. Gait not checked.  PSYCHIATRIC: The patient is alert and oriented x 3, place, person and only the year but not the month or the day.  Normal affect and good eye contact. SKIN: No obvious rash, lesion, or ulcer.   LABORATORY PANEL:   CBC Recent Labs  Lab 05/25/20 2116  WBC 6.5  HGB 14.3  HCT 49.5  PLT 158   ------------------------------------------------------------------------------------------------------------------  Chemistries  Recent Labs  Lab 05/25/20 2116  NA 143  K 3.7  CL 96*  CO2 42*  GLUCOSE 114*  BUN 15  CREATININE 0.54*  CALCIUM 9.6   ------------------------------------------------------------------------------------------------------------------  Cardiac Enzymes No results for input(s): TROPONINI in the last 168 hours. ------------------------------------------------------------------------------------------------------------------  RADIOLOGY:  DG Chest 2 View  Result Date: 05/25/2020 CLINICAL DATA:  Shortness of breath EXAM: CHEST - 2 VIEW COMPARISON:  04/10/2020 FINDINGS: The heart size and mediastinal contours are within normal limits. Low volume examination with diffuse bilateral interstitial pulmonary opacity and atelectasis or consolidation of the lung bases, generally similar in appearance to prior examination. Disc degenerative disease of the thoracic spine. IMPRESSION: Low volume examination with diffuse bilateral interstitial pulmonary opacity and atelectasis or consolidation of the lung bases, generally similar in appearance to prior examination. Findings may reflect edema and/or chronic interstitial change. No new or focal airspace opacity. Electronically Signed   By: Lauralyn PrimesAlex  Bibbey M.D.   On: 05/25/2020 21:58   CT Angio Chest PE W and/or Wo Contrast  Result Date: 05/25/2020 CLINICAL DATA:  Shortness of breath EXAM: CT ANGIOGRAPHY CHEST WITH CONTRAST TECHNIQUE: Multidetector CT imaging of the  chest was performed using the standard protocol during bolus administration of intravenous contrast. Multiplanar CT image reconstructions and MIPs were obtained to evaluate the vascular anatomy. CONTRAST:  75mL OMNIPAQUE IOHEXOL 350 MG/ML SOLN COMPARISON:  03/31/2020 FINDINGS: Cardiovascular: Satisfactory opacification of the pulmonary arteries to the segmental level. No evidence of pulmonary embolism. Cardiomegaly. Three-vessel coronary artery calcifications. No pericardial effusion. Aortic atherosclerosis. Mediastinum/Nodes: No enlarged mediastinal, hilar, or axillary lymph nodes. Thyroid gland, trachea, and esophagus demonstrate no significant findings. Lungs/Pleura: Small left pleural effusion. Low pulmonary volumes with extensive dependent bibasilar atelectasis and/or consolidation, similar in appearance to prior examination. Mild centrilobular emphysema. Upper Abdomen: No acute abnormality. Musculoskeletal: No chest wall abnormality. No acute or significant osseous findings. Review of the MIP images confirms the above findings. IMPRESSION: 1. Negative examination for pulmonary embolism. 2. Small left pleural effusion. 3. Low pulmonary volumes with extensive dependent bibasilar atelectasis and/or consolidation, similar in appearance to prior examination. 4. Emphysema. 5. Coronary artery disease. Aortic Atherosclerosis (ICD10-I70.0) and Emphysema (ICD10-J43.9). Electronically Signed   By: Lauralyn PrimesAlex  Bibbey M.D.   On: 05/25/2020 22:55  IMPRESSION AND PLAN:  Active Problems:   ACS (acute coronary syndrome) (HCC)  1.  Acute dyspnea with elevated troponin I, rule out acute coronary syndrome. -The patient will be admitted to an observation progressive cardiac unit bed. -We will follow serial troponin I's. -We will place him on IV heparin for now to replace Eliquis. -The patient will be continued on aspirin as well as Toprol-XL Protonix HCTZ. -We will continue statin therapy. -We will obtain a cardiology  consultation. -I notified Dr. Lady Gary about the patient.  2.  Acute hypoxemia with acute on chronic hypoxic respiratory failure. Chest CTA was negative for PE but showed bibasilar atelectasis. -O2 protocol will be followed. -Incentive spirometry will be utilized -We will continue bronchodilator therapy.  3.  Essential hypertension. -Continue Toprol-XL, Cozaar and HCTZ.  4.  Depression with dementia. -We will continue Seroquel, Lexapro and Aricept.  5.  Gout. -We will continue allopurinol.  DVT prophylaxis: We will place the patient on IV heparin for now.   Code Status: full code. Family Communication:  The plan of care was discussed in details with the patient (and family). I answered all questions. The patient agreed to proceed with the above mentioned plan. Further management will depend upon hospital course. Disposition Plan: Back to previous home environment Consults called: Cardiology consult as above.    All the records are reviewed and case discussed with ED provider.  Status is: Observation  The patient remains OBS appropriate and will d/c before 2 midnights.  Dispo: The patient is from: SNF              Anticipated d/c is to: SNF              Patient currently is not medically stable to d/c.   Difficult to place patient No   TOTAL TIME TAKING CARE OF THIS PATIENT: 55 minutes.    Hannah Beat M.D on 05/25/2020 at 11:33 PM  [facility]   From 7 PM-7 AM, contact night-coverage [email or web address]  CC: Primary care physician; Patient, No Pcp Per (Inactive)

## 2020-05-25 NOTE — ED Notes (Signed)
Patient transported to CT 

## 2020-05-25 NOTE — ED Notes (Addendum)
Troponin 220 per lab, Derrill Kay MD made aware.

## 2020-05-25 NOTE — ED Notes (Signed)
Patient transported to X-ray 

## 2020-05-25 NOTE — ED Provider Notes (Signed)
[facility] Emergency Department Provider Note  ____________________________________________   I have reviewed the triage vital signs and the nursing notes.   HISTORY  Chief Complaint Shortness of Breath   History limited by and level 5 caveat due to: Dementia   HPI Melvin Campbell is a 80 y.o. male who presents to the emergency department today because of concern for shortness of breath. Patient has a history of dementia so history is somewhat hard to obtain. Apparently he went to staff desk today at his living facility complaining of shortness of breath. When asked he variably says it has been going on for months or years. He apparently was not wearing his oxygen when he went up to the staff desk. He denied any chest pain or fever to myself.   Records reviewed. Per medical record review patient has a history of admission for pneumonia, and respiratory failure with NSTEMI a couple of months ago.    Patient Active Problem List   Diagnosis Date Noted  . Acute on chronic respiratory failure with hypoxia (HCC) 03/31/2020  . Elevated troponin 03/31/2020  . Community acquired pneumonia 03/31/2020  . Bilateral lower extremity edema 03/31/2020      Prior to Admission medications   Medication Sig Start Date End Date Taking? Authorizing Provider  acetaminophen (TYLENOL) 325 MG tablet Take 650 mg by mouth every 4 (four) hours as needed. 12/31/19   [provider]  albuterol (VENTOLIN HFA) 108 (90 Base) MCG/ACT inhaler Inhale 2 puffs into the lungs every 4 (four) hours as needed. 01/02/20   [provider]  allopurinol (ZYLOPRIM) 300 MG tablet Take 300 mg by mouth daily. 03/23/20   [provider]  ARTIFICIAL TEARS 1.4 % ophthalmic solution Place 1 drop into the right eye every 6 (six) hours. 03/08/20   [provider]  aspirin 81 MG EC tablet Take 1 tablet (81 mg total) by mouth daily. Swallow whole. 04/13/20   Cipriano Bunker, MD   atorvastatin (LIPITOR) 80 MG tablet Take 1 tablet (80 mg total) by mouth daily. 04/13/20   Cipriano Bunker, MD  budesonide (PULMICORT) 0.25 MG/2ML nebulizer solution Take 2 mLs (0.25 mg total) by nebulization 2 (two) times daily. 04/12/20   Cipriano Bunker, MD  ELIQUIS 5 MG TABS tablet Take 5 mg by mouth 2 (two) times daily. 03/23/20   [provider]  erythromycin ophthalmic ointment Place 1 application into the right eye 3 (three) times daily. 02/28/20   [provider]  escitalopram (LEXAPRO) 20 MG tablet Take 20 mg by mouth daily. 03/23/20   [provider]  folic acid (FOLVITE) 1 MG tablet Take 1 mg by mouth daily. 03/23/20   [provider]  furosemide (LASIX) 20 MG tablet Take 20 mg by mouth daily as needed. 11/28/19   [provider]  hydrochlorothiazide (HYDRODIURIL) 25 MG tablet Take 25 mg by mouth daily. 03/23/20   [provider]  losartan (COZAAR) 100 MG tablet Take 100 mg by mouth daily. 03/23/20   [provider]  magnesium oxide (MAG-OX) 400 (241.3 Mg) MG tablet Take 1 tablet by mouth 2 (two) times daily. 03/23/20   [provider]  metoprolol succinate (TOPROL-XL) 25 MG 24 hr tablet Take 25 mg by mouth daily. 03/25/20   [provider]  metoprolol succinate (TOPROL-XL) 50 MG 24 hr tablet Take 50 mg by mouth daily. 03/23/20   [provider]  Multiple Vitamins-Minerals (MULTI-DAY PLUS MINERALS) TABS Take 1 tablet by mouth daily. 03/23/20  [provider]  nitroGLYCERIN (NITROSTAT) 0.4 MG SL tablet Place 1 tablet (0.4 mg total) under the tongue every 5 (five) minutes x 3 doses as needed for chest pain. 04/12/20   Cipriano Bunker, MD  prednisoLONE acetate (PRED FORTE) 1 % ophthalmic suspension Administer 1 drop to the right eye two (2) times a day. 02/28/20   [provider]  Thiamine HCl (B-1) 100 MG TABS Take 1 tablet by mouth daily. 03/23/20   [provider]    Allergies Amlodipine  No  family history on file.  Social History Social History   Tobacco Use  . Smoking status: Never Smoker  . Smokeless tobacco: Never Used    Review of Systems Constitutional: No fever/chills Eyes: No visual changes. ENT: No sore throat. Cardiovascular: Denies chest pain. Respiratory: Positive for shortness of breath.  Gastrointestinal: No abdominal pain.  No nausea, no vomiting.  No diarrhea.   Genitourinary: Negative for dysuria. Musculoskeletal: Negative for back pain. Skin: Negative for rash. Neurological: Negative for headaches, focal weakness or numbness.  ____________________________________________   PHYSICAL EXAM:  VITAL SIGNS: ED Triage Vitals  Enc Vitals Group     BP --      Pulse Rate 05/25/20 2108 62     Resp --      Temp --      Temp src --      SpO2 05/25/20 2108 (!) 84 %     Weight 05/25/20 2105 180 lb 12.4 oz (82 kg)     Height 05/25/20 2105 5\' 7"  (1.702 m)     Head Circumference --      Peak Flow --      Pain Score 05/25/20 2104 0   Constitutional: Awake and alert. Not completely oriented.  Eyes: Conjunctivae are normal.  ENT      Head: Normocephalic and atraumatic.      Nose: No congestion/rhinnorhea.      Mouth/Throat: Mucous membranes are moist.      Neck: No stridor. Hematological/Lymphatic/Immunilogical: No cervical lymphadenopathy. Cardiovascular: Normal rate, regular rhythm.  No murmurs, rubs, or gallops.  Respiratory: Normal respiratory effort without tachypnea nor retractions. Some rhonchi.  Gastrointestinal: Soft and non tender. No rebound. No guarding.  Genitourinary: Deferred Musculoskeletal: Normal range of motion in all extremities. No lower extremity edema. Neurologic:  Dementia. Moving all extremities.  Skin:  Skin is warm, dry and intact. No rash noted.  ____________________________________________    LABS (pertinent positives/negatives)  COVID negative Trop hs 220 BMP na 143, k 3.7, glu 114, cr 0.54 CBC wbc 6.5, hgb  14.3, plt 158  ____________________________________________   EKG  I, 2105, attending physician, personally viewed and interpreted this EKG  EKG Time: 2112 Rate: 60 Rhythm: sinus rhythm Axis: left axis deviation Intervals: qtc 430 QRS: LBBB ST changes: no st elevation Impression: abnormal ekg  ____________________________________________    RADIOLOGY  CXR Diffuse interstitial opacities  CT angio chest No PE. Emphysema.    ____________________________________________   PROCEDURES  Procedures  ____________________________________________   INITIAL IMPRESSION / ASSESSMENT AND PLAN / ED COURSE  Pertinent labs & imaging results that were available during my care of the patient were reviewed by me and considered in my medical decision making (see chart for details).   Patient presented to the emergency department today because of concern for shortness of breath. On exam patient in no significant respiratory distress. Blood work showed an elevated troponin. Given history of PE did have concern for that being the possible etiology so  CT was performed. This did not show any pulmonary embolism. No clear airspace disease. No leukocytosis to suggest pneumonia. Per chart review he had similar complaints a couple of months ago and it appears he responded well to steroids. Will plan on starting treatment for COPD. Do feel patient would benefit from admission.   ____________________________________________   FINAL CLINICAL IMPRESSION(S) / ED DIAGNOSES  Final diagnoses:  Hypoxia  Shortness of breath  Elevated troponin     Note: This dictation was prepared with Dragon dictation. Any transcriptional errors that result from this process are unintentional     Phineas Semen, MD 05/25/20 [phone]

## 2020-05-25 NOTE — ED Triage Notes (Addendum)
Pt BIB EMS from [facility], staff at facility states that patient walked up to nurses station without chronic 3L [place] on and stated he was short of breath. Hx of dementia, pt is not good historian. Seen last week for same per EMS. Given one breathing treatment with EMS and one at facility. Pt denies pain or associated symptoms. Pt unable to complete sentences without having to stop for breathing, 83% on 3L [place], [place] increased to 6L with improvement to 93%.

## 2020-05-26 ENCOUNTER — Encounter: Payer: Self-pay | Admitting: Family Medicine

## 2020-05-26 DIAGNOSIS — I110 Hypertensive heart disease with heart failure: Secondary | ICD-10-CM | POA: Diagnosis present

## 2020-05-26 DIAGNOSIS — Z888 Allergy status to other drugs, medicaments and biological substances status: Secondary | ICD-10-CM | POA: Diagnosis not present

## 2020-05-26 DIAGNOSIS — Z7982 Long term (current) use of aspirin: Secondary | ICD-10-CM | POA: Diagnosis not present

## 2020-05-26 DIAGNOSIS — F0391 Unspecified dementia with behavioral disturbance: Secondary | ICD-10-CM | POA: Diagnosis not present

## 2020-05-26 DIAGNOSIS — I4891 Unspecified atrial fibrillation: Secondary | ICD-10-CM | POA: Diagnosis not present

## 2020-05-26 DIAGNOSIS — F0390 Unspecified dementia without behavioral disturbance: Secondary | ICD-10-CM | POA: Diagnosis present

## 2020-05-26 DIAGNOSIS — R778 Other specified abnormalities of plasma proteins: Secondary | ICD-10-CM | POA: Diagnosis not present

## 2020-05-26 DIAGNOSIS — Z7901 Long term (current) use of anticoagulants: Secondary | ICD-10-CM | POA: Diagnosis not present

## 2020-05-26 DIAGNOSIS — I5031 Acute diastolic (congestive) heart failure: Secondary | ICD-10-CM | POA: Diagnosis present

## 2020-05-26 DIAGNOSIS — Z8619 Personal history of other infectious and parasitic diseases: Secondary | ICD-10-CM

## 2020-05-26 DIAGNOSIS — I10 Essential (primary) hypertension: Secondary | ICD-10-CM | POA: Diagnosis not present

## 2020-05-26 DIAGNOSIS — F32A Depression, unspecified: Secondary | ICD-10-CM | POA: Diagnosis present

## 2020-05-26 DIAGNOSIS — R0602 Shortness of breath: Secondary | ICD-10-CM | POA: Diagnosis present

## 2020-05-26 DIAGNOSIS — Z66 Do not resuscitate: Secondary | ICD-10-CM | POA: Diagnosis present

## 2020-05-26 DIAGNOSIS — Z9981 Dependence on supplemental oxygen: Secondary | ICD-10-CM | POA: Diagnosis not present

## 2020-05-26 DIAGNOSIS — M109 Gout, unspecified: Secondary | ICD-10-CM | POA: Diagnosis present

## 2020-05-26 DIAGNOSIS — Z7951 Long term (current) use of inhaled steroids: Secondary | ICD-10-CM | POA: Diagnosis not present

## 2020-05-26 DIAGNOSIS — R0902 Hypoxemia: Secondary | ICD-10-CM | POA: Diagnosis not present

## 2020-05-26 DIAGNOSIS — E785 Hyperlipidemia, unspecified: Secondary | ICD-10-CM | POA: Diagnosis present

## 2020-05-26 DIAGNOSIS — Z79899 Other long term (current) drug therapy: Secondary | ICD-10-CM | POA: Diagnosis not present

## 2020-05-26 DIAGNOSIS — Z823 Family history of stroke: Secondary | ICD-10-CM | POA: Diagnosis not present

## 2020-05-26 DIAGNOSIS — Z86711 Personal history of pulmonary embolism: Secondary | ICD-10-CM | POA: Diagnosis not present

## 2020-05-26 DIAGNOSIS — J9621 Acute and chronic respiratory failure with hypoxia: Secondary | ICD-10-CM | POA: Diagnosis present

## 2020-05-26 DIAGNOSIS — I249 Acute ischemic heart disease, unspecified: Secondary | ICD-10-CM | POA: Diagnosis present

## 2020-05-26 DIAGNOSIS — Z20822 Contact with and (suspected) exposure to covid-19: Secondary | ICD-10-CM | POA: Diagnosis present

## 2020-05-26 DIAGNOSIS — J9811 Atelectasis: Secondary | ICD-10-CM | POA: Diagnosis present

## 2020-05-26 DIAGNOSIS — Z8249 Family history of ischemic heart disease and other diseases of the circulatory system: Secondary | ICD-10-CM | POA: Diagnosis not present

## 2020-05-26 DIAGNOSIS — J449 Chronic obstructive pulmonary disease, unspecified: Secondary | ICD-10-CM | POA: Diagnosis present

## 2020-05-26 LAB — APTT
aPTT: 43 seconds — ABNORMAL HIGH (ref 24–36)
aPTT: 69 seconds — ABNORMAL HIGH (ref 24–36)

## 2020-05-26 LAB — BASIC METABOLIC PANEL
Anion gap: 11 (ref 5–15)
BUN: 15 mg/dL (ref 8–23)
CO2: 38 mmol/L — ABNORMAL HIGH (ref 22–32)
Calcium: 9.5 mg/dL (ref 8.9–10.3)
Chloride: 95 mmol/L — ABNORMAL LOW (ref 98–111)
Creatinine, Ser: 0.49 mg/dL — ABNORMAL LOW (ref 0.61–1.24)
GFR, Estimated: >60 mL/min (ref >60)
Glucose, Bld: 122 mg/dL — ABNORMAL HIGH (ref 70–99)
Potassium: 4.3 mmol/L (ref 3.5–5.1)
Sodium: 144 mmol/L (ref 135–145)

## 2020-05-26 LAB — PROCALCITONIN: Procalcitonin: <0.1 ng/mL

## 2020-05-26 LAB — HEPARIN LEVEL (UNFRACTIONATED): Heparin Unfractionated: 3.28 IU/mL — ABNORMAL HIGH (ref 0.30–0.70)

## 2020-05-26 LAB — BRAIN NATRIURETIC PEPTIDE: B Natriuretic Peptide: 386.4 pg/mL — ABNORMAL HIGH (ref 0.0–100.0)

## 2020-05-26 LAB — PROTIME-INR
INR: 1.2 (ref 0.8–1.2)
Prothrombin Time: 15.1 seconds (ref 11.4–15.2)

## 2020-05-26 LAB — TROPONIN I (HIGH SENSITIVITY): Troponin I (High Sensitivity): 245 ng/L (ref <18)

## 2020-05-26 MED ORDER — HALOPERIDOL LACTATE 5 MG/ML IJ SOLN: 1.0000 mg | Freq: Four times a day (QID) | INTRAMUSCULAR | Status: DC | PRN

## 2020-05-26 MED ORDER — FUROSEMIDE 10 MG/ML IJ SOLN
20.0000 mg | Freq: Once | INTRAMUSCULAR | Status: AC
  Administered 2020-05-26: 20 mg via INTRAVENOUS
  Filled 2020-05-26: qty 2

## 2020-05-26 MED ORDER — HEPARIN BOLUS VIA INFUSION
4000.0000 [IU] | Freq: Once | INTRAVENOUS | Status: AC
  Administered 2020-05-26: 4000 [IU] via INTRAVENOUS
  Filled 2020-05-26: qty 4000

## 2020-05-26 MED ORDER — APIXABAN 5 MG PO TABS
5.0000 mg | ORAL_TABLET | Freq: Two times a day (BID) | ORAL | Status: DC
  Administered 2020-05-26: 5 mg via ORAL
  Filled 2020-05-26: qty 1

## 2020-05-26 MED ORDER — APIXABAN 5 MG PO TABS
5.0000 mg | ORAL_TABLET | Freq: Two times a day (BID) | ORAL | Status: DC
  Administered 2020-05-26 – 2020-05-29 (×6): 5 mg via ORAL
  Filled 2020-05-26 (×6): qty 1

## 2020-05-26 MED ORDER — HEPARIN (PORCINE) 25000 UT/250ML-% IV SOLN
1100.0000 [IU]/h | INTRAVENOUS | Status: DC
  Administered 2020-05-26: 1100 [IU]/h via INTRAVENOUS
  Filled 2020-05-26: qty 250

## 2020-05-26 NOTE — Progress Notes (Signed)
PROGRESS NOTE    Melvin Mchilip M Birdwell  ZOX:[id number] DOB: 08/01/1940 DOA: 05/25/2020 PCP: Patient, No Pcp Per (Inactive)    Brief Narrative:  Melvin Campbell is a 80 y.o. Caucasian male with medical history significant for hypertension, dyslipidemia and PE with chronic respiratory failure on home O2 at 2.5 L/min by nasal cannula, and dementia who presented to the emergency room with acute onset of worsening dyspnea at his skilled nursing facility with associated hypoxemia with a pulse oximetry of mid to high 80s on 3 L of O2 by nasal cannula.  He denied any cough or wheezing, chest pain or palpitations.  No fever or chills.  No nausea or vomiting or abdominal pain.  No dysuria, oliguria or hematuria or flank pain.  The patient had a similar presentation a couple months ago when he was admitted here and his troponin at that time was over 1200.  He was managed conservatively at this time given the lack of chest pain.  When I went into his room, patient had already pulled out his IV, oxygen and monitor leads and was walking in the room trying to get out.  He seemed fairly confused initially however later he was returned to bed and was alert and oriented to his name, place and only the year but not to the day or the month.  ED Course: Upon presentation to the ER, blood pressure was 152/77 with respiratory rate of 28 and pulse oximetry of 84% on 3 L of O2 by nasal cannula and 93% on 6 L of O2 by nasal cannula.  Labs revealed unremarkable BMP and CBC.  High-sensitivity troponin I was 220.  COVID-19 PCR and influenza antigens were negative.   4/10-CT chest negative for PE.  No cp or sob. But has doe. Cardiology consulted     Consultants:   Cardiology  Procedures:  CT of the chest 1. Negative examination for pulmonary embolism. 2. Small left pleural effusion. 3. Low pulmonary volumes with extensive dependent bibasilar atelectasis and/or consolidation, similar in appearance to  prior examination. 4. Emphysema. 5. Coronary artery disease.  Aortic Atherosclerosis (ICD10-I70.0) and Emphysema (ICD10-J43.9   Antimicrobials:       Subjective: No cp , dizziness or lightheadedness  Objective: Vitals:   05/26/20 0133 05/26/20 0356 05/26/20 0715 05/26/20 0724  BP: 140/74 (!) 146/74 (!) 153/75   Pulse: (!) 58 62 68   Resp: 17 18 16    Temp: (!) 97.3 F (36.3 C) 98.4 F (36.9 C) 98.4 F (36.9 C)   TempSrc: Oral Oral Oral   SpO2: 100% 94% 99% 95%  Weight:      Height:        Intake/Output Summary (Last 24 hours) at 05/26/2020 0837 Last data filed at 05/26/2020 0600 Gross per 24 hour  Intake 473 ml  Output 250 ml  Net 223 ml   Filed Weights   05/25/20 2105 05/26/20 0127  Weight: 82 kg 82.9 kg    Examination:  General exam: Appears calm and comfortable  Respiratory system: Clear to auscultation. Respiratory effort normal. Cardiovascular system: S1 & S2 heard, RRR. No JVD, murmurs, rubs, gallops or clicks Gastrointestinal system: Abdomen is nondistended, soft and nontender.Normal bowel sounds heard. Central nervous system: Alert and oriented.  Grossly intact Extremities: No edema Skin: Warm dry Psychiatry:  Mood & affect appropriate.     Data Reviewed: I have personally reviewed following labs and imaging studies  CBC: Recent Labs  Lab 05/25/20 2116  WBC 6.5  NEUTROABS 4.4  HGB 14.3  HCT 49.5  MCV 86.2  PLT 158   Basic Metabolic Panel: Recent Labs  Lab 05/25/20 2116 05/26/20 0547  NA 143 144  K 3.7 4.3  CL 96* 95*  CO2 42* 38*  GLUCOSE 114* 122*  BUN 15 15  CREATININE 0.54* 0.49*  CALCIUM 9.6 9.5   GFR: Estimated Creatinine Clearance: 77.1 mL/min (A) (by C-G formula based on SCr of 0.49 mg/dL (L)). Liver Function Tests: No results for input(s): AST, ALT, ALKPHOS, BILITOT, PROT, ALBUMIN in the last 168 hours. No results for input(s): LIPASE, AMYLASE in the last 168 hours. No results for input(s): AMMONIA in the last 168  hours. Coagulation Profile: Recent Labs  Lab 05/26/20 0035  INR 1.2   Cardiac Enzymes: No results for input(s): CKTOTAL, CKMB, CKMBINDEX, TROPONINI in the last 168 hours. BNP (last 3 results) No results for input(s): PROBNP in the last 8760 hours. HbA1C: No results for input(s): HGBA1C in the last 72 hours. CBG: No results for input(s): GLUCAP in the last 168 hours. Lipid Profile: No results for input(s): CHOL, HDL, LDLCALC, TRIG, CHOLHDL, LDLDIRECT in the last 72 hours. Thyroid Function Tests: No results for input(s): TSH, T4TOTAL, FREET4, T3FREE, THYROIDAB in the last 72 hours. Anemia Panel: No results for input(s): VITAMINB12, FOLATE, FERRITIN, TIBC, IRON, RETICCTPCT in the last 72 hours. Sepsis Labs: Recent Labs  Lab 05/26/20 0035  PROCALCITON <0.10    Recent Results (from the past 240 hour(s))  Resp Panel by RT-PCR (Flu A&B, Covid) Nasopharyngeal Swab     Status: None   Collection Time: 05/25/20  9:16 PM   Specimen: Nasopharyngeal Swab; Nasopharyngeal(NP) swabs in vial transport medium  Result Value Ref Range Status   SARS Coronavirus 2 by RT PCR NEGATIVE NEGATIVE Final    Comment: (NOTE) SARS-CoV-2 target nucleic acids are NOT DETECTED.  The SARS-CoV-2 RNA is generally detectable in upper respiratory specimens during the acute phase of infection. The lowest concentration of SARS-CoV-2 viral copies this assay can detect is 138 copies/mL. A negative result does not preclude SARS-Cov-2 infection and should not be used as the sole basis for treatment or other patient management decisions. A negative result may occur with  improper specimen collection/handling, submission of specimen other than nasopharyngeal swab, presence of viral mutation(s) within the areas targeted by this assay, and inadequate number of viral copies(<138 copies/mL). A negative result must be combined with clinical observations, patient history, and epidemiological information. The expected  result is Negative.  Fact Sheet for Patients:  BloggerCourse.com  Fact Sheet for Healthcare Providers:  SeriousBroker.it  This test is no t yet approved or cleared by the [place] FDA and  has been authorized for detection and/or diagnosis of SARS-CoV-2 by FDA under an Emergency Use Authorization (EUA). This EUA will remain  in effect (meaning this test can be used) for the duration of the COVID-19 declaration under Section 564(b)(1) of the Act, 21 U.S.C.section 360bbb-3(b)(1), unless the authorization is terminated  or revoked sooner.       Influenza A by PCR NEGATIVE NEGATIVE Final   Influenza B by PCR NEGATIVE NEGATIVE Final    Comment: (NOTE) The Xpert Xpress SARS-CoV-2/FLU/RSV plus assay is intended as an aid in the diagnosis of influenza from Nasopharyngeal swab specimens and should not be used as a sole basis for treatment. Nasal washings and aspirates are unacceptable for Xpert Xpress SARS-CoV-2/FLU/RSV testing.  Fact Sheet for Patients: BloggerCourse.com  Fact Sheet for Healthcare Providers: SeriousBroker.it  This test is not yet  approved or cleared by the [place] and has been authorized for detection and/or diagnosis of SARS-CoV-2 by FDA under an Emergency Use Authorization (EUA). This EUA will remain in effect (meaning this test can be used) for the duration of the COVID-19 declaration under Section 564(b)(1) of the Act, 21 U.S.C. section 360bbb-3(b)(1), unless the authorization is terminated or revoked.  Performed at [facility], [address] [place], [place] [address]          Radiology Studies: DG Chest 2 View  Result Date: 05/25/2020 CLINICAL DATA:  Shortness of breath EXAM: CHEST - 2 VIEW COMPARISON:  04/10/2020 FINDINGS: The heart size and mediastinal contours are within normal limits. Low volume examination with diffuse  bilateral interstitial pulmonary opacity and atelectasis or consolidation of the lung bases, generally similar in appearance to prior examination. Disc degenerative disease of the thoracic spine. IMPRESSION: Low volume examination with diffuse bilateral interstitial pulmonary opacity and atelectasis or consolidation of the lung bases, generally similar in appearance to prior examination. Findings may reflect edema and/or chronic interstitial change. No new or focal airspace opacity. Electronically Signed   By: Lauralyn Primes M.D.   On: 05/25/2020 21:58   CT Angio Chest PE W and/or Wo Contrast  Result Date: 05/25/2020 CLINICAL DATA:  Shortness of breath EXAM: CT ANGIOGRAPHY CHEST WITH CONTRAST TECHNIQUE: Multidetector CT imaging of the chest was performed using the standard protocol during bolus administration of intravenous contrast. Multiplanar CT image reconstructions and MIPs were obtained to evaluate the vascular anatomy. CONTRAST:  47mL OMNIPAQUE IOHEXOL 350 MG/ML SOLN COMPARISON:  03/31/2020 FINDINGS: Cardiovascular: Satisfactory opacification of the pulmonary arteries to the segmental level. No evidence of pulmonary embolism. Cardiomegaly. Three-vessel coronary artery calcifications. No pericardial effusion. Aortic atherosclerosis. Mediastinum/Nodes: No enlarged mediastinal, hilar, or axillary lymph nodes. Thyroid gland, trachea, and esophagus demonstrate no significant findings. Lungs/Pleura: Small left pleural effusion. Low pulmonary volumes with extensive dependent bibasilar atelectasis and/or consolidation, similar in appearance to prior examination. Mild centrilobular emphysema. Upper Abdomen: No acute abnormality. Musculoskeletal: No chest wall abnormality. No acute or significant osseous findings. Review of the MIP images confirms the above findings. IMPRESSION: 1. Negative examination for pulmonary embolism. 2. Small left pleural effusion. 3. Low pulmonary volumes with extensive dependent bibasilar  atelectasis and/or consolidation, similar in appearance to prior examination. 4. Emphysema. 5. Coronary artery disease. Aortic Atherosclerosis (ICD10-I70.0) and Emphysema (ICD10-J43.9). Electronically Signed   By: Lauralyn Primes M.D.   On: 05/25/2020 22:55        Scheduled Meds: . allopurinol  300 mg Oral Daily  . aspirin EC  81 mg Oral Daily  . atorvastatin  80 mg Oral QHS  . budesonide  0.25 mg Nebulization BID  . donepezil  5 mg Oral Daily  . erythromycin  1 application Right Eye TID  . escitalopram  20 mg Oral Daily  . folic acid  1 mg Oral Daily  . hydrochlorothiazide  25 mg Oral Daily  . losartan  100 mg Oral Daily  . magnesium oxide  400 mg Oral BID  . metoprolol succinate  75 mg Oral Daily  . multivitamin with minerals  1 tablet Oral Daily  . polyvinyl alcohol  1 drop Right Eye Q6H  . prednisoLONE acetate  1 drop Right Eye BID  . QUEtiapine  25 mg Oral QHS  . thiamine  100 mg Oral Daily   Continuous Infusions: . sodium chloride 100 mL/hr at 05/26/20 0400  . heparin 1,100 Units/hr (05/26/20 0400)  Assessment & Plan:   Active Problems:   ACS (acute coronary syndrome) (HCC)   1.  Acute diastolic HF Elevated TP On 6L satting 95%,.  BNP elevated.  Troponins. Cardiology consulted, input was appreciated-atypical for ACS. Recent echo with EF 60 to 65%. Heparin drip was discontinued and will attempt medical management Patient does not appear to be a candidate for invasive evaluation at this time Resume Eliquis-  Lasix 20 mg IV x1 was given IV steroid x1 given. Will give another dose of IV Lasix Daily weight I's and Continue aspirin, Toprol-XL, statins Hold HCTZ since giving Lasix   2. Acute hypoxemia with acute on chronic hypoxic respiratory failure.Chest CTA was negative for PE but showed bibasilar atelectasis. Hx/o PE in 2021 Eliquis restarted by cards, heparin gtt stopped as above Wean down to baseline 02 IS Continue MDI Received iv steroid x1  3.  Essential hypertension. -Stable, continue Toprol-XL and Cozaar  Hold HCTZ since giving IV Lasix    4. Depression with dementia. -Continue Seroquel, Lexapro, Aricept    5. Gout. -Continue allopurinol    DVT prophylaxis: elqiuis Code Status: Full Family Communication: None at bedside   Status is: Observation  The patient remains OBS appropriate and will d/c before 2 midnights.  Dispo: The patient is from: SNF              Anticipated d/c is to: SNF              Patient currently is not medically stable to d/c.   Difficult to place patient No            LOS: 0 days   Time spent: 35 minutes with more than 50% on COC    Lynn Ito, MD [facility] Pager [phone]  If 7PM-7AM, please contact night-coverage 05/26/2020, 8:37 AM

## 2020-05-26 NOTE — ED Notes (Signed)
Entered room to find pt had removed monitor leads and pulled out his IV. Bleeding controlled with direct pressure. Pt cleaned up, returned to bed and bed alarm reset.

## 2020-05-26 NOTE — Consult Note (Signed)
Cardiology Consultation Note    Patient ID: Melvin Campbell, MRN: [id number], DOB/AGE: July 30, 1940 80 y.o. Admit date: 05/25/2020   Date of Consult: 05/26/2020 Primary Physician: Patient, No Pcp Per (Inactive) Primary Cardiologist: none  Chief Complaint: chest pain Reason for Consultation: abnormal troponin Requesting MD: Dr. Marylu Lund  HPI: Melvin Campbell is a 80 y.o. male with history of dementia, hypertension, dyslipidemia, history of PE on Eliquis, and chronic respiratory failure on home O2.  He was brought to the emergency room with acute worsening dyspnea at his skilled nursing facility associated with hypoxia with pulse ox in the mid to high 80s.  He had no fever or chills.  He denied any chest pain.  He had presented approximately 1 to 2 months ago with similar complaints.  Had a troponin of 1200 was managed conservatively of due to the lack of chest pain and comorbidities.  He had confusion in the emergency room pulling his IVs out.  His troponin was 220.  Covid negative.  Subsequent troponin was 245.  EKG showed sinus rhythm with chronic left bundle branch block.  Chest x-ray revealed bilateral interstitial pulmonary opacities similar to his previous chest x-ray.  CT of the chest was negative for pulmonary embolism.  There was a small left pleural effusion emphysema and coronary calcification.  Similar to CT done in February 2022.  Patient was treated with Eliquis as an outpatient 5 mg twice daily for his pulmonary embolus.  Patient was taken off of Eliquis and placed on heparin.  Continue with aspirin Toprol and statin.  Troponins have been flat during this admission again.  Patient again has no chest pain.  No EKG changes consistent with ischemia.  History reviewed. No pertinent past medical history.    Surgical History: History reviewed. No pertinent surgical history.   Home Meds: Prior to Admission medications   Medication Sig Start Date End Date Taking? Authorizing Provider   acetaminophen (TYLENOL) 325 MG tablet Take 650 mg by mouth every 4 (four) hours as needed. 12/31/19  Yes [provider]  albuterol (VENTOLIN HFA) 108 (90 Base) MCG/ACT inhaler Inhale 2 puffs into the lungs every 4 (four) hours as needed. 01/02/20  Yes [provider]  allopurinol (ZYLOPRIM) 300 MG tablet Take 300 mg by mouth daily. 03/23/20  Yes [provider]  ARTIFICIAL TEARS 1.4 % ophthalmic solution Place 1 drop into the right eye every 6 (six) hours. 03/08/20  Yes [provider]  aspirin 81 MG EC tablet Take 1 tablet (81 mg total) by mouth daily. Swallow whole. 04/13/20  Yes Cipriano Bunker, MD  atorvastatin (LIPITOR) 80 MG tablet Take 1 tablet (80 mg total) by mouth daily. 04/13/20  Yes Cipriano Bunker, MD  budesonide (PULMICORT) 0.25 MG/2ML nebulizer solution Take 2 mLs (0.25 mg total) by nebulization 2 (two) times daily. 04/12/20  Yes Cipriano Bunker, MD  donepezil (ARICEPT) 5 MG tablet Take 5 mg by mouth daily. 05/19/20  Yes [provider]  ELIQUIS 5 MG TABS tablet Take 5 mg by mouth 2 (two) times daily. 03/23/20  Yes [provider]  erythromycin ophthalmic ointment Place 1 application into the right eye 3 (three) times daily. 02/28/20  Yes [provider]  escitalopram (LEXAPRO) 20 MG tablet Take 20 mg by mouth daily. 03/23/20  Yes [provider]  folic acid (FOLVITE) 1 MG tablet Take 1 mg by mouth daily. 03/23/20  Yes [provider]  furosemide (LASIX) 20 MG tablet Take 20 mg by mouth  daily as needed. 11/28/19  Yes [provider]  hydrochlorothiazide (HYDRODIURIL) 25 MG tablet Take 25 mg by mouth daily. 03/23/20  Yes [provider]  losartan (COZAAR) 100 MG tablet Take 100 mg by mouth daily. 03/23/20  Yes [provider]  magnesium oxide (MAG-OX) 400 (241.3 Mg) MG tablet Take 1 tablet by mouth 2 (two) times daily. 03/23/20  Yes [provider]  metoprolol succinate (TOPROL-XL) 25 MG 24  hr tablet Take 25 mg by mouth daily. 03/25/20  Yes [provider]  metoprolol succinate (TOPROL-XL) 50 MG 24 hr tablet Take 50 mg by mouth daily. 03/23/20  Yes [provider]  Multiple Vitamins-Minerals (MULTI-DAY PLUS MINERALS) TABS Take 1 tablet by mouth daily. 03/23/20  Yes [provider]  nitroGLYCERIN (NITROSTAT) 0.4 MG SL tablet Place 1 tablet (0.4 mg total) under the tongue every 5 (five) minutes x 3 doses as needed for chest pain. 04/12/20  Yes Cipriano BunkerKumar, Pardeep, MD  prednisoLONE acetate (PRED FORTE) 1 % ophthalmic suspension Administer 1 drop to the right eye two (2) times a day. 02/28/20  Yes [provider]  QUEtiapine (SEROQUEL) 25 MG tablet Take 25 mg by mouth at bedtime. 05/19/20  Yes [provider]  Thiamine HCl (B-1) 100 MG TABS Take 1 tablet by mouth daily. 03/23/20  Yes [provider]    Inpatient Medications:  . allopurinol  300 mg Oral Daily  . aspirin EC  81 mg Oral Daily  . atorvastatin  80 mg Oral QHS  . budesonide  0.25 mg Nebulization BID  . donepezil  5 mg Oral Daily  . erythromycin  1 application Right Eye TID  . escitalopram  20 mg Oral Daily  . folic acid  1 mg Oral Daily  . hydrochlorothiazide  25 mg Oral Daily  . losartan  100 mg Oral Daily  . magnesium oxide  400 mg Oral BID  . metoprolol succinate  75 mg Oral Daily  . multivitamin with minerals  1 tablet Oral Daily  . polyvinyl alcohol  1 drop Right Eye Q6H  . prednisoLONE acetate  1 drop Right Eye BID  . QUEtiapine  25 mg Oral QHS  . thiamine  100 mg Oral Daily   . sodium chloride 100 mL/hr at 05/26/20 0400  . heparin 1,100 Units/hr (05/26/20 0400)    Allergies:  Allergies  Allergen Reactions  . Amlodipine     Social History   Socioeconomic History  . Marital status: Widowed    Spouse name: Not on file  . Number of children: Not on file  . Years of education: Not on file  . Highest education level: Not on file  Occupational History  . Not on  file  Tobacco Use  . Smoking status: Never Smoker  . Smokeless tobacco: Never Used  Vaping Use  . Vaping Use: Never used  Substance and Sexual Activity  . Alcohol use: Not Currently  . Drug use: Not Currently  . Sexual activity: Not on file  Other Topics Concern  . Not on file  Social History Narrative  . Not on file   Social Determinants of Health   Financial Resource Strain: Not on file  Food Insecurity: Not on file  Transportation Needs: Not on file  Physical Activity: Not on file  Stress: Not on file  Social Connections: Not on file  Intimate Partner Violence: Not on file     History reviewed. No pertinent family history.   Review of Systems: A 12-system  review of systems was performed and is negative except as noted in the HPI.  Labs: No results for input(s): CKTOTAL, CKMB, TROPONINI in the last 72 hours. Lab Results  Component Value Date   WBC 6.5 05/25/2020   HGB 14.3 05/25/2020   HCT 49.5 05/25/2020   MCV 86.2 05/25/2020   PLT 158 05/25/2020    Recent Labs  Lab 05/26/20 0547  NA 144  K 4.3  CL 95*  CO2 38*  BUN 15  CREATININE 0.49*  CALCIUM 9.5  GLUCOSE 122*   Lab Results  Component Value Date   CHOL 148 03/31/2020   HDL 30 (L) 03/31/2020   LDLCALC 101 (H) 03/31/2020   TRIG 86 03/31/2020   No results found for: DDIMER  Radiology/Studies:  DG Chest 2 View  Result Date: 05/25/2020 CLINICAL DATA:  Shortness of breath EXAM: CHEST - 2 VIEW COMPARISON:  04/10/2020 FINDINGS: The heart size and mediastinal contours are within normal limits. Low volume examination with diffuse bilateral interstitial pulmonary opacity and atelectasis or consolidation of the lung bases, generally similar in appearance to prior examination. Disc degenerative disease of the thoracic spine. IMPRESSION: Low volume examination with diffuse bilateral interstitial pulmonary opacity and atelectasis or consolidation of the lung bases, generally similar in appearance to prior  examination. Findings may reflect edema and/or chronic interstitial change. No new or focal airspace opacity. Electronically Signed   By: Lauralyn Primes M.D.   On: 05/25/2020 21:58   CT Angio Chest PE W and/or Wo Contrast  Result Date: 05/25/2020 CLINICAL DATA:  Shortness of breath EXAM: CT ANGIOGRAPHY CHEST WITH CONTRAST TECHNIQUE: Multidetector CT imaging of the chest was performed using the standard protocol during bolus administration of intravenous contrast. Multiplanar CT image reconstructions and MIPs were obtained to evaluate the vascular anatomy. CONTRAST:  67mL OMNIPAQUE IOHEXOL 350 MG/ML SOLN COMPARISON:  03/31/2020 FINDINGS: Cardiovascular: Satisfactory opacification of the pulmonary arteries to the segmental level. No evidence of pulmonary embolism. Cardiomegaly. Three-vessel coronary artery calcifications. No pericardial effusion. Aortic atherosclerosis. Mediastinum/Nodes: No enlarged mediastinal, hilar, or axillary lymph nodes. Thyroid gland, trachea, and esophagus demonstrate no significant findings. Lungs/Pleura: Small left pleural effusion. Low pulmonary volumes with extensive dependent bibasilar atelectasis and/or consolidation, similar in appearance to prior examination. Mild centrilobular emphysema. Upper Abdomen: No acute abnormality. Musculoskeletal: No chest wall abnormality. No acute or significant osseous findings. Review of the MIP images confirms the above findings. IMPRESSION: 1. Negative examination for pulmonary embolism. 2. Small left pleural effusion. 3. Low pulmonary volumes with extensive dependent bibasilar atelectasis and/or consolidation, similar in appearance to prior examination. 4. Emphysema. 5. Coronary artery disease. Aortic Atherosclerosis (ICD10-I70.0) and Emphysema (ICD10-J43.9). Electronically Signed   By: Lauralyn Primes M.D.   On: 05/25/2020 22:55    Wt Readings from Last 3 Encounters:  05/26/20 82.9 kg  04/07/20 82 kg    EKG: Normal sinus rhythm with no  ischemia  Physical Exam:  Blood pressure (!) 153/75, pulse 68, temperature 98.4 F (36.9 C), temperature source Oral, resp. rate 16, height 5\' 7"  (1.702 m), weight 82.9 kg, SpO2 95 %. Body mass index is 28.62 kg/m. General: Well developed, well nourished, in no acute distress. Head: Normocephalic, atraumatic, sclera non-icteric, no xanthomas, nares are without discharge.  Neck: Negative for carotid bruits. JVD not elevated. Lungs: Clear bilaterally to auscultation without wheezes, rales, or rhonchi. Breathing is unlabored. Heart: RRR with S1 S2. No murmurs, rubs, or gallops appreciated. Abdomen: Soft, non-tender, non-distended with normoactive bowel sounds. No hepatomegaly. No rebound/guarding. No  obvious abdominal masses. Msk:  Strength and tone appear normal for age. Extremities: No clubbing or cyanosis. No edema.  Distal pedal pulses are 2+ and equal bilaterally. Neuro: . No facial asymmetry. No focal deficit. Moves all extremities spontaneously. Psych:  Responds to questions appropriately.  Somewhat confused on occasion.     Assessment and Plan  80 y.o. male with history of dementia, hypertension, dyslipidemia, history of PE on Eliquis, and chronic respiratory failure on home O2.  He was brought to the emergency room with acute worsening dyspnea at his skilled nursing facility associated with hypoxia with pulse ox in the mid to high 80s.  He had no fever or chills.  He denied any chest pain.  He had presented approximately 1 to 2 months ago with similar complaints.  Had a troponin of 1200 was managed conservatively of due to the lack of chest pain and comorbidities.  He had confusion in the emergency room pulling his IVs out.  His troponin was 220.  Covid negative.  Subsequent troponin was 245.  EKG showed sinus rhythm with chronic left bundle branch block.  Chest x-ray revealed bilateral interstitial pulmonary opacities similar to his previous chest x-ray.  CT of the chest was negative for  pulmonary embolism.  There was a small left pleural effusion emphysema and coronary calcification.  Similar to CT done in February 2022.  Patient was treated with Eliquis as an outpatient 5 mg twice daily for his pulmonary embolus.  Patient was taken off of Eliquis and placed on heparin.  Continue with aspirin Toprol and statin.  Troponins have been flat during this admission again.  Patient again has no chest pain.  No EKG changes consistent with ischemia.  1.  Abnormal troponin-somewhat atypical for acute coronary syndrome.  Has been flat in the mid 200s.  He had a similar presentation with dyspnea with flat troponins in the 1200s several months ago.  Treated medically.  Echocardiogram 2 months ago showed an EF of 60 to 65%.  Had LVH.  EKG unremarkable.  Will discontinue heparin and attempt medical management.  Does not appear to be a candidate for invasive evaluation at present.  Will place back on Eliquis.  2.  Dyspnea-etiology unclear.  Chest CT showed emphysema likely in etiology.  Small left pleural effusion.  No pulmonary embolism.  Coronary calcification.  No appreciable change from previously.  Chest x-ray showed diffuse bilateral interstitial pulmonary opacity.  Possible mild edema.  Would continue with incentive spirometry and bronchodilators.  May try a dose of IV Lasix x1.Caren Macadam MD 05/26/2020, 8:40 AM Pager: [phone]

## 2020-05-26 NOTE — Progress Notes (Signed)
Date and time results received: 05/26/20  0353   Test: Heparin Critical Value: 3.2  Name of Provider Notified: Harrold Donath pharmacist   Orders Received? Or Actions Taken?: No new orders

## 2020-05-26 NOTE — Progress Notes (Signed)
ANTICOAGULATION CONSULT NOTE  Pharmacy Consult for heparin infusion Indication: ACS/STEMI  Allergies  Allergen Reactions  . Amlodipine     Patient Measurements: Height: 5\' 7"  (170.2 cm) Weight: 82 kg (180 lb 12.4 oz) IBW/kg (Calculated) : 66.1 Heparin Dosing Weight: 82 kg  Vital Signs: Temp: 98.1 F (36.7 C) (04/09 2108) Temp Source: Oral (04/09 2108) BP: 165/90 (04/10 0010) Pulse Rate: 65 (04/10 0010)  Labs: Recent Labs    05/25/20 2116  HGB 14.3  HCT 49.5  PLT 158  CREATININE 0.54*  TROPONINIHS 220*    Estimated Creatinine Clearance: 76.8 mL/min (A) (by C-G formula based on SCr of 0.54 mg/dL (L)).   Medical History: No past medical history on file.  Medications:  PTA Med: Apixaban 5 mg BID  Assessment: Pt is 80 yo male with h/o dementia, HTN, PE, CRF, presenting to ER with acute onset of worsening dyspnea.  Following arrival, initial Troponin I = 220, cardiology consulted.   Goal of Therapy:  Heparin level 0.3-0.7 units/ml Monitor platelets by anticoagulation protocol: Yes Goal aPTT: 66 - 102   Plan:  Ordered baseline labs Ordered 4000 unit bolus x 1 Ordered heparin infusion to start at rate of 1100 units/hr Due to PTA med, will follow aPTT until correlation with HL Order aPTT 8 hours after infusion started HL & CBC daily while on heparin  76, PharmD, [facility] 05/26/2020 12:32 AM

## 2020-05-26 NOTE — Progress Notes (Signed)
End of Shift Summary:  Date: 05/26/20 Shift: 0700-1900 Ambulatory: standby assist for safety Significant Events: Heparin drip dc this morning, restarted on Eliquis. Daughter Tobi Bastos at bedside for supper, updated at bedside. Lasix given x2. No other events this shift.

## 2020-05-27 DIAGNOSIS — F0391 Unspecified dementia with behavioral disturbance: Secondary | ICD-10-CM | POA: Diagnosis not present

## 2020-05-27 DIAGNOSIS — I10 Essential (primary) hypertension: Secondary | ICD-10-CM | POA: Diagnosis not present

## 2020-05-27 DIAGNOSIS — J9621 Acute and chronic respiratory failure with hypoxia: Secondary | ICD-10-CM | POA: Diagnosis not present

## 2020-05-27 LAB — BASIC METABOLIC PANEL
Anion gap: 5 (ref 5–15)
BUN: 25 mg/dL — ABNORMAL HIGH (ref 8–23)
CO2: 41 mmol/L — ABNORMAL HIGH (ref 22–32)
Calcium: 9.2 mg/dL (ref 8.9–10.3)
Chloride: 97 mmol/L — ABNORMAL LOW (ref 98–111)
Creatinine, Ser: 0.68 mg/dL (ref 0.61–1.24)
GFR, Estimated: >60 mL/min (ref >60)
Glucose, Bld: 97 mg/dL (ref 70–99)
Potassium: 4 mmol/L (ref 3.5–5.1)
Sodium: 143 mmol/L (ref 135–145)

## 2020-05-27 LAB — BRAIN NATRIURETIC PEPTIDE: B Natriuretic Peptide: 524 pg/mL — ABNORMAL HIGH (ref 0.0–100.0)

## 2020-05-27 MED ORDER — IPRATROPIUM-ALBUTEROL 0.5-2.5 (3) MG/3ML IN SOLN
3.0000 mL | Freq: Four times a day (QID) | RESPIRATORY_TRACT | Status: DC | PRN
  Administered 2020-05-28: 3 mL via RESPIRATORY_TRACT
  Filled 2020-05-27: qty 3

## 2020-05-27 NOTE — Progress Notes (Signed)
PROGRESS NOTE    Melvin Campbell  KPV:[id number] DOB: 1940/07/12 DOA: 05/25/2020 PCP: Patient, No Pcp Per (Inactive)    Assessment & Plan:   Active Problems:   ACS (acute coronary syndrome) (HCC)   Dyspnea: etiology unclear, possibly secondary to emphysema. S/p IV lasix. Monitor I/Os and daily weights  Elevated troponins: atypical for ACS as per cardio. IV heparin was d/c. Not a candidate for invasive evaluation. Continue on metoprolol, aspirin, eliquis, statin  Acute on chronic hypoxic respiratory failure: continue on supplemental oxygen. Continue on bronchodilators Encourage incentive spirometry  Hx of PE in 2021: continue on home dose of eliquis  HTN: continue on metoprolol, losartan. Continue to hold HCTZ  Depression: severity unknown. Continue on home dose of lexapro  Dementia: continue on home dose of seroquel, aricept  Gout: continue on allopurinol   DVT prophylaxis: eliquis  Code Status: full  Family Communication:  Disposition Plan:.depends on PT/OT recs  Level of care: Progressive Cardiac   Status is: Inpatient  Remains inpatient appropriate because:Unsafe d/c plan, IV treatments appropriate due to intensity of illness or inability to take PO and Inpatient level of care appropriate due to severity of illness   Dispo: The patient is from: Home              Anticipated d/c is to: SNF vs [facility]              Patient currently is not medically stable to d/c.   Difficult to place patient Yes      Consultants:   Cardio    Procedures:    Antimicrobials:    Subjective: Pt c/o malaise   Objective: Vitals:   05/27/20 0411 05/27/20 0500 05/27/20 0806 05/27/20 0818  BP: 115/61  (!) 147/81   Pulse: 72  62   Resp: 18  17   Temp: 97.7 F (36.5 C)  98.7 F (37.1 C)   TempSrc: Oral     SpO2: 98%  97% 95%  Weight:  83.8 kg    Height:        Intake/Output Summary (Last 24 hours) at 05/27/2020 1421 Last data filed at 05/27/2020 1153 Gross per 24 hour   Intake 840 ml  Output 890 ml  Net -50 ml   Filed Weights   05/25/20 2105 05/26/20 0127 05/27/20 0500  Weight: 82 kg 82.9 kg 83.8 kg    Examination:  General exam: Appears calm and comfortable  Respiratory system: diminished breath sounds b/l  Cardiovascular system: S1 & S2+. No rubs, gallops or clicks.  Gastrointestinal system: Abdomen is nondistended, soft and nontender. Normal bowel sounds heard. Central nervous system: Alert and awake. Moves all extremities  Psychiatry: Judgement and insight appear abnormal. Flat mood and affect    Data Reviewed: I have personally reviewed following labs and imaging studies  CBC: Recent Labs  Lab 05/25/20 2116  WBC 6.5  NEUTROABS 4.4  HGB 14.3  HCT 49.5  MCV 86.2  PLT 158   Basic Metabolic Panel: Recent Labs  Lab 05/25/20 2116 05/26/20 0547 05/27/20 0415  NA 143 144 143  K 3.7 4.3 4.0  CL 96* 95* 97*  CO2 42* 38* 41*  GLUCOSE 114* 122* 97  BUN 15 15 25*  CREATININE 0.54* 0.49* 0.68  CALCIUM 9.6 9.5 9.2   GFR: Estimated Creatinine Clearance: 77.5 mL/min (by C-G formula based on SCr of 0.68 mg/dL). Liver Function Tests: No results for input(s): AST, ALT, ALKPHOS, BILITOT, PROT, ALBUMIN in the last 168 hours. No results  for input(s): LIPASE, AMYLASE in the last 168 hours. No results for input(s): AMMONIA in the last 168 hours. Coagulation Profile: Recent Labs  Lab 05/26/20 0035  INR 1.2   Cardiac Enzymes: No results for input(s): CKTOTAL, CKMB, CKMBINDEX, TROPONINI in the last 168 hours. BNP (last 3 results) No results for input(s): PROBNP in the last 8760 hours. HbA1C: No results for input(s): HGBA1C in the last 72 hours. CBG: No results for input(s): GLUCAP in the last 168 hours. Lipid Profile: No results for input(s): CHOL, HDL, LDLCALC, TRIG, CHOLHDL, LDLDIRECT in the last 72 hours. Thyroid Function Tests: No results for input(s): TSH, T4TOTAL, FREET4, T3FREE, THYROIDAB in the last 72 hours. Anemia  Panel: No results for input(s): VITAMINB12, FOLATE, FERRITIN, TIBC, IRON, RETICCTPCT in the last 72 hours. Sepsis Labs: Recent Labs  Lab 05/26/20 0035  PROCALCITON <0.10    Recent Results (from the past 240 hour(s))  Resp Panel by RT-PCR (Flu A&B, Covid) Nasopharyngeal Swab     Status: None   Collection Time: 05/25/20  9:16 PM   Specimen: Nasopharyngeal Swab; Nasopharyngeal(NP) swabs in vial transport medium  Result Value Ref Range Status   SARS Coronavirus 2 by RT PCR NEGATIVE NEGATIVE Final    Comment: (NOTE) SARS-CoV-2 target nucleic acids are NOT DETECTED.  The SARS-CoV-2 RNA is generally detectable in upper respiratory specimens during the acute phase of infection. The lowest concentration of SARS-CoV-2 viral copies this assay can detect is 138 copies/mL. A negative result does not preclude SARS-Cov-2 infection and should not be used as the sole basis for treatment or other patient management decisions. A negative result may occur with  improper specimen collection/handling, submission of specimen other than nasopharyngeal swab, presence of viral mutation(s) within the areas targeted by this assay, and inadequate number of viral copies(<138 copies/mL). A negative result must be combined with clinical observations, patient history, and epidemiological information. The expected result is Negative.  Fact Sheet for Patients:  BloggerCourse.com  Fact Sheet for Healthcare Providers:  SeriousBroker.it  This test is no t yet approved or cleared by the [place] FDA and  has been authorized for detection and/or diagnosis of SARS-CoV-2 by FDA under an Emergency Use Authorization (EUA). This EUA will remain  in effect (meaning this test can be used) for the duration of the COVID-19 declaration under Section 564(b)(1) of the Act, 21 U.S.C.section 360bbb-3(b)(1), unless the authorization is terminated  or revoked sooner.        Influenza A by PCR NEGATIVE NEGATIVE Final   Influenza B by PCR NEGATIVE NEGATIVE Final    Comment: (NOTE) The Xpert Xpress SARS-CoV-2/FLU/RSV plus assay is intended as an aid in the diagnosis of influenza from Nasopharyngeal swab specimens and should not be used as a sole basis for treatment. Nasal washings and aspirates are unacceptable for Xpert Xpress SARS-CoV-2/FLU/RSV testing.  Fact Sheet for Patients: BloggerCourse.com  Fact Sheet for Healthcare Providers: SeriousBroker.it  This test is not yet approved or cleared by the [place] FDA and has been authorized for detection and/or diagnosis of SARS-CoV-2 by FDA under an Emergency Use Authorization (EUA). This EUA will remain in effect (meaning this test can be used) for the duration of the COVID-19 declaration under Section 564(b)(1) of the Act, 21 U.S.C. section 360bbb-3(b)(1), unless the authorization is terminated or revoked.  Performed at [facility], [address], [place], [place] [address]          Radiology Studies: DG Chest 2 View  Result Date: 05/25/2020 CLINICAL  DATA:  Shortness of breath EXAM: CHEST - 2 VIEW COMPARISON:  04/10/2020 FINDINGS: The heart size and mediastinal contours are within normal limits. Low volume examination with diffuse bilateral interstitial pulmonary opacity and atelectasis or consolidation of the lung bases, generally similar in appearance to prior examination. Disc degenerative disease of the thoracic spine. IMPRESSION: Low volume examination with diffuse bilateral interstitial pulmonary opacity and atelectasis or consolidation of the lung bases, generally similar in appearance to prior examination. Findings may reflect edema and/or chronic interstitial change. No new or focal airspace opacity. Electronically Signed   By: Lauralyn Primes M.D.   On: 05/25/2020 21:58   CT Angio Chest PE W and/or Wo Contrast  Result Date:  05/25/2020 CLINICAL DATA:  Shortness of breath EXAM: CT ANGIOGRAPHY CHEST WITH CONTRAST TECHNIQUE: Multidetector CT imaging of the chest was performed using the standard protocol during bolus administration of intravenous contrast. Multiplanar CT image reconstructions and MIPs were obtained to evaluate the vascular anatomy. CONTRAST:  91mL OMNIPAQUE IOHEXOL 350 MG/ML SOLN COMPARISON:  03/31/2020 FINDINGS: Cardiovascular: Satisfactory opacification of the pulmonary arteries to the segmental level. No evidence of pulmonary embolism. Cardiomegaly. Three-vessel coronary artery calcifications. No pericardial effusion. Aortic atherosclerosis. Mediastinum/Nodes: No enlarged mediastinal, hilar, or axillary lymph nodes. Thyroid gland, trachea, and esophagus demonstrate no significant findings. Lungs/Pleura: Small left pleural effusion. Low pulmonary volumes with extensive dependent bibasilar atelectasis and/or consolidation, similar in appearance to prior examination. Mild centrilobular emphysema. Upper Abdomen: No acute abnormality. Musculoskeletal: No chest wall abnormality. No acute or significant osseous findings. Review of the MIP images confirms the above findings. IMPRESSION: 1. Negative examination for pulmonary embolism. 2. Small left pleural effusion. 3. Low pulmonary volumes with extensive dependent bibasilar atelectasis and/or consolidation, similar in appearance to prior examination. 4. Emphysema. 5. Coronary artery disease. Aortic Atherosclerosis (ICD10-I70.0) and Emphysema (ICD10-J43.9). Electronically Signed   By: Lauralyn Primes M.D.   On: 05/25/2020 22:55        Scheduled Meds: . allopurinol  300 mg Oral Daily  . apixaban  5 mg Oral BID  . aspirin EC  81 mg Oral Daily  . atorvastatin  80 mg Oral QHS  . budesonide  0.25 mg Nebulization BID  . donepezil  5 mg Oral Daily  . erythromycin  1 application Right Eye TID  . escitalopram  20 mg Oral Daily  . folic acid  1 mg Oral Daily  . losartan  100  mg Oral Daily  . magnesium oxide  400 mg Oral BID  . metoprolol succinate  75 mg Oral Daily  . multivitamin with minerals  1 tablet Oral Daily  . polyvinyl alcohol  1 drop Right Eye Q6H  . prednisoLONE acetate  1 drop Right Eye BID  . QUEtiapine  25 mg Oral QHS  . thiamine  100 mg Oral Daily   Continuous Infusions:   LOS: 1 day    Time spent: 31 mins     Charise Killian, MD [facility] Pager [phone]  If 7PM-7AM, please contact night-coverage 05/27/2020, 2:21 PM

## 2020-05-27 NOTE — Progress Notes (Addendum)
Patient Name: Melvin Campbell Date of Encounter: 05/27/2020  Hospital Problem List     Active Problems:   ACS (acute coronary syndrome) [facility])    Patient Profile     80 y.o. male with history of dementia, hypertension, dyslipidemia, history of PE on Eliquis, and chronic respiratory failure on home O2.  He was brought to the emergency room with acute worsening dyspnea at his skilled nursing facility associated with hypoxia with pulse ox in the mid to high 80s.  He had no fever or chills.  He denied any chest pain.  He had presented approximately 1 to 2 months ago with similar complaints.  Had a troponin of 1200 was managed conservatively of due to the lack of chest pain and comorbidities.  He had confusion in the emergency room pulling his IVs out.  His troponin was 220.  Covid negative.  Subsequent troponin was 245.  EKG showed sinus rhythm with chronic left bundle branch block.  Chest x-ray revealed bilateral interstitial pulmonary opacities similar to his previous chest x-ray.  CT of the chest was negative for pulmonary embolism.  There was a small left pleural effusion emphysema and coronary calcification.  Similar to CT done in February 2022.  Patient was treated with Eliquis as an outpatient 5 mg twice daily for his pulmonary embolus.   Subjective   Denies chest pain.  Still short of breath.  Inpatient Medications    . allopurinol  300 mg Oral Daily  . apixaban  5 mg Oral BID  . aspirin EC  81 mg Oral Daily  . atorvastatin  80 mg Oral QHS  . budesonide  0.25 mg Nebulization BID  . donepezil  5 mg Oral Daily  . erythromycin  1 application Right Eye TID  . escitalopram  20 mg Oral Daily  . folic acid  1 mg Oral Daily  . losartan  100 mg Oral Daily  . magnesium oxide  400 mg Oral BID  . metoprolol succinate  75 mg Oral Daily  . multivitamin with minerals  1 tablet Oral Daily  . polyvinyl alcohol  1 drop Right Eye Q6H  . prednisoLONE acetate  1 drop Right Eye BID  . QUEtiapine   25 mg Oral QHS  . thiamine  100 mg Oral Daily    Vital Signs    Vitals:   05/27/20 0411 05/27/20 0500 05/27/20 0806 05/27/20 0818  BP: 115/61  (!) 147/81   Pulse: 72  62   Resp: 18  17   Temp: 97.7 F (36.5 C)  98.7 F (37.1 C)   TempSrc: Oral     SpO2: 98%  97% 95%  Weight:  83.8 kg    Height:        Intake/Output Summary (Last 24 hours) at 05/27/2020 1714 Last data filed at 05/27/2020 1153 Gross per 24 hour  Intake 840 ml  Output 740 ml  Net 100 ml   Filed Weights   05/25/20 2105 05/26/20 0127 05/27/20 0500  Weight: 82 kg 82.9 kg 83.8 kg    Physical Exam    GEN: Well nourished, well developed, in no acute distress.  HEENT: normal.  Neck: Supple, no JVD, carotid bruits, or masses. Cardiac: Regular rate and rhythm Respiratory:  Respirations regular and unlabored, clear to auscultation bilaterally. GI: Soft, nontender, nondistended, BS + x 4. MS: no deformity or atrophy. Skin: warm and dry, no rash. Neuro:  Strength and sensation are intact. Psych: Normal affect.  Labs  CBC Recent Labs    05/25/20 2116  WBC 6.5  NEUTROABS 4.4  HGB 14.3  HCT 49.5  MCV 86.2  PLT 158   Basic Metabolic Panel Recent Labs    69/48/54 0547 05/27/20 0415  NA 144 143  K 4.3 4.0  CL 95* 97*  CO2 38* 41*  GLUCOSE 122* 97  BUN 15 25*  CREATININE 0.49* 0.68  CALCIUM 9.5 9.2   Liver Function Tests No results for input(s): AST, ALT, ALKPHOS, BILITOT, PROT, ALBUMIN in the last 72 hours. No results for input(s): LIPASE, AMYLASE in the last 72 hours. Cardiac Enzymes No results for input(s): CKTOTAL, CKMB, CKMBINDEX, TROPONINI in the last 72 hours. BNP Recent Labs    05/26/20 0035 05/27/20 0415  BNP 386.4* 524.0*   D-Dimer No results for input(s): DDIMER in the last 72 hours. Hemoglobin A1C No results for input(s): HGBA1C in the last 72 hours. Fasting Lipid Panel No results for input(s): CHOL, HDL, LDLCALC, TRIG, CHOLHDL, LDLDIRECT in the last 72 hours. Thyroid  Function Tests No results for input(s): TSH, T4TOTAL, T3FREE, THYROIDAB in the last 72 hours.  Invalid input(s): FREET3  Telemetry    Atrial fibrillation with controlled ventricular response  ECG    Normal sinus rhythm with no ischemia  Radiology    DG Chest 2 View  Result Date: 05/25/2020 CLINICAL DATA:  Shortness of breath EXAM: CHEST - 2 VIEW COMPARISON:  04/10/2020 FINDINGS: The heart size and mediastinal contours are within normal limits. Low volume examination with diffuse bilateral interstitial pulmonary opacity and atelectasis or consolidation of the lung bases, generally similar in appearance to prior examination. Disc degenerative disease of the thoracic spine. IMPRESSION: Low volume examination with diffuse bilateral interstitial pulmonary opacity and atelectasis or consolidation of the lung bases, generally similar in appearance to prior examination. Findings may reflect edema and/or chronic interstitial change. No new or focal airspace opacity. Electronically Signed   By: Lauralyn Primes M.D.   On: 05/25/2020 21:58   CT Angio Chest PE W and/or Wo Contrast  Result Date: 05/25/2020 CLINICAL DATA:  Shortness of breath EXAM: CT ANGIOGRAPHY CHEST WITH CONTRAST TECHNIQUE: Multidetector CT imaging of the chest was performed using the standard protocol during bolus administration of intravenous contrast. Multiplanar CT image reconstructions and MIPs were obtained to evaluate the vascular anatomy. CONTRAST:  41mL OMNIPAQUE IOHEXOL 350 MG/ML SOLN COMPARISON:  03/31/2020 FINDINGS: Cardiovascular: Satisfactory opacification of the pulmonary arteries to the segmental level. No evidence of pulmonary embolism. Cardiomegaly. Three-vessel coronary artery calcifications. No pericardial effusion. Aortic atherosclerosis. Mediastinum/Nodes: No enlarged mediastinal, hilar, or axillary lymph nodes. Thyroid gland, trachea, and esophagus demonstrate no significant findings. Lungs/Pleura: Small left pleural  effusion. Low pulmonary volumes with extensive dependent bibasilar atelectasis and/or consolidation, similar in appearance to prior examination. Mild centrilobular emphysema. Upper Abdomen: No acute abnormality. Musculoskeletal: No chest wall abnormality. No acute or significant osseous findings. Review of the MIP images confirms the above findings. IMPRESSION: 1. Negative examination for pulmonary embolism. 2. Small left pleural effusion. 3. Low pulmonary volumes with extensive dependent bibasilar atelectasis and/or consolidation, similar in appearance to prior examination. 4. Emphysema. 5. Coronary artery disease. Aortic Atherosclerosis (ICD10-I70.0) and Emphysema (ICD10-J43.9). Electronically Signed   By: Lauralyn Primes M.D.   On: 05/25/2020 22:55    Assessment & Plan    80 y.o. male with history of dementia, hypertension, dyslipidemia, history of PE on Eliquis, and chronic respiratory failure on home O2.  He was brought to the emergency room with acute worsening  dyspnea at his skilled nursing facility associated with hypoxia with pulse ox in the mid to high 80s.  He had no fever or chills.  He denied any chest pain.  He had presented approximately 1 to 2 months ago with similar complaints.  Had a troponin of 1200 was managed conservatively of due to the lack of chest pain and comorbidities.  He had confusion in the emergency room pulling his IVs out.  His troponin was 220.  Covid negative.  Subsequent troponin was 245.  EKG showed sinus rhythm with chronic left bundle branch block. Chest x-ray revealed bilateral interstitial pulmonary opacities similar to his previous chest x-ray.  CT of the chest was negative for pulmonary embolism.  There was a small left pleural effusion emphysema and coronary calcification.  Similar to CT done in February 2022.  Patient was treated with Eliquis as an outpatient 5 mg twice daily for his pulmonary embolus.  Patient was taken off of Eliquis and placed on heparin.  Continue  with aspirin Toprol and statin.  Troponins have been flat during this admission again.  Patient again has no chest pain.  No EKG changes consistent with ischemia.  1.  Abnormal troponin-somewhat atypical for acute coronary syndrome.  Has been flat in the mid 200s.  He had a similar presentation with dyspnea with flat troponins in the 1200s several months ago.  Treated medically.  Echocardiogram 2 months ago showed an EF of 60 to 65%.  Had LVH.  EKG unremarkable.  Will discontinue heparin and attempt medical management.  Does not appear to be a candidate for invasive evaluation at present.  Will continue with Eliquis.  2.  Dyspnea-etiology unclear.  Chest CT showed emphysema likely in etiology.  Small left pleural effusion.  No pulmonary embolism.  Coronary calcification.  No appreciable change from previously.  Chest x-ray showed diffuse bilateral interstitial pulmonary opacity.  Possible mild edema.  Would continue with incentive spirometry and bronchodilators.  BNP increased to 524 from 386.  Creatinine slightly elevated to 0.68 but still in normal range.     Signed, Darlin Priestly Romir Klimowicz MD 05/27/2020, 5:14 PM  Pager: [phone]

## 2020-05-27 NOTE — Evaluation (Signed)
Physical Therapy Evaluation Patient Details Name: Melvin Campbell MRN: [id number] DOB: 10/20/1940 Today's Date: 05/27/2020   History of Present Illness  presented to ER secondary to SOB, hypoxemia; admitted for management of acute dyspnea with elevated troponin, rule out of ACS.  Not consistent with ACS per cardiology (troponins flat in 200s), recommending medical management at this time.  Clinical Impression  Patient sitting edge of bed upon arrival, voicing need to use urinal.  Assisted with needs as appropriate.  Patient generally alert and oriented to self, location as hospital and year; however, generally confused with limited recall of prior level of function, limited insight into safety needs.  Bilat UE/LE strength and ROM grossly symmetrical and WFL; no focal weakness appreciated.  Able to complete bed mobility with mod indep; sit/stand, basic transfers and gait (100') without assist device, cga/close sup.  Demonstrates broad BOS with excessive bilat LE ER; choppy stepping pattern with limited heel strike/toe off.  Fair cadence, but limited dynamic stability.  Frequently reaching for walls/furniture for external stabilization; however, no overt buckling or LOB.  Mod SOB with exertion; BORG 8/10, sats >92% on 4L with activity. Would benefit from skilled PT to address above deficits and promote optimal return to PLOF.; Recommend transition to HHPT upon discharge from acute hospitalization.     Follow Up Recommendations Home health PT    Equipment Recommendations       Recommendations for Other Services       Precautions / Restrictions Precautions Precautions: Fall Restrictions Weight Bearing Restrictions: No      Mobility  Bed Mobility Overal bed mobility: Modified Independent                  Transfers Overall transfer level: Needs assistance   Transfers: Sit to/from Stand Sit to Stand: Min guard            Ambulation/Gait Ambulation/Gait assistance: Min  guard Gait Distance (Feet): 100 Feet Assistive device: (P) None       General Gait Details: broad BOS with excessive bilat LE ER; choppy stepping pattern with limited heel strike/toe off.  Fair cadence, but limited dynamic stability.  Frequently reaching for walls/furniture for external stabilization; however, no overt buckling or LOB.  Mod SOB with exertion; BORG 8/10, sats >92% on 4L with activity.  Stairs            Wheelchair Mobility    Modified Rankin (Stroke Patients Only)       Balance Overall balance assessment: Needs assistance Sitting-balance support: No upper extremity supported;Feet supported Sitting balance-Leahy Scale: Good     Standing balance support: No upper extremity supported Standing balance-Leahy Scale: Fair                               Pertinent Vitals/Pain Pain Assessment: No/denies pain    Home Living Family/patient expects to be discharged to:: Assisted living               Home Equipment: None Additional Comments: Unreliable historian.  Per chart, resident of [facility].  Patient endorses living along with intermittent check in from daughter.    Prior Function Level of Independence: Independent         Comments: Indep with ADLs, household mobilization without assist device; per chart, home O2 at 3L (patient denies).  Denies fall history.  Will verify with family/facility as available.     Hand Dominance  Extremity/Trunk Assessment   Upper Extremity Assessment Upper Extremity Assessment: Overall WFL for tasks assessed    Lower Extremity Assessment Lower Extremity Assessment: Overall WFL for tasks assessed (grossly 4/5 throughout)       Communication   Communication: No difficulties  Cognition Arousal/Alertness: Awake/alert Behavior During Therapy: WFL for tasks assessed/performed                                   General Comments: Alert and oriented to self; aware of location  as hospital and states year as "22".  follows simple commands, but provides conflicting information re: social history; poor insight/awareness.      General Comments      Exercises Other Exercises Other Exercises: Reviewed activity of progressive mobility, activity pacing/energy conservation, home safety modifications; patient voiced understanding, but demonstrates limited carry-over of education.  Will reinforce in subsequent sessions as needed.   Assessment/Plan    PT Assessment Patient needs continued PT services  PT Problem List Decreased activity tolerance;Decreased balance;Decreased mobility;Cardiopulmonary status limiting activity;Decreased cognition;Decreased coordination;Decreased knowledge of precautions;Decreased safety awareness;Decreased knowledge of use of DME       PT Treatment Interventions DME instruction;Gait training;Functional mobility training;Therapeutic activities;Therapeutic exercise;Balance training;Patient/family education    PT Goals (Current goals can be found in the Care Plan section)  Acute Rehab PT Goals Patient Stated Goal: to get back home PT Goal Formulation: With patient Time For Goal Achievement: 06/10/20 Potential to Achieve Goals: Good    Frequency Min 2X/week   Barriers to discharge        Co-evaluation               AM-PAC PT "6 Clicks" Mobility  Outcome Measure Help needed turning from your back to your side while in a flat bed without using bedrails?: None Help needed moving from lying on your back to sitting on the side of a flat bed without using bedrails?: None Help needed moving to and from a bed to a chair (including a wheelchair)?: A Little Help needed standing up from a chair using your arms (e.g., wheelchair or bedside chair)?: A Little Help needed to walk in hospital room?: A Little Help needed climbing 3-5 steps with a railing? : A Little 6 Click Score: 20    End of Session Equipment Utilized During Treatment: Gait  belt;Oxygen Activity Tolerance: Patient tolerated treatment well Patient left: in chair;with call bell/phone within reach;with chair alarm set Nurse Communication: Mobility status PT Visit Diagnosis: Muscle weakness (generalized) (M62.81);Difficulty in walking, not elsewhere classified (R26.2)    Time: 0354-6568 PT Time Calculation (min) (ACUTE ONLY): 17 min   Charges:   PT Evaluation $PT Eval Moderate Complexity: 1 Mod PT Treatments $Therapeutic Activity: 8-22 mins       Marise Knapper H. Manson Passey, PT, DPT, NCS 05/27/20, 2:15 PM [phone]

## 2020-05-27 NOTE — Evaluation (Signed)
Occupational Therapy Evaluation Patient Details Name: Melvin Campbell MRN: [id number] DOB: [birth year] Today's Date: 05/27/2020    History of Present Illness 80 yo male who presented to ER secondary to SOB, hypoxemia; admitted for management of acute dyspnea with elevated troponin, rule out of ACS.  Not consistent with ACS per cardiology (troponins flat in 200s), recommending medical management at this time.   Clinical Impression   Pt seen for OT evaluation on this date. Upon arrival to room, pt side-lying in bed with RN present. Pt alert and oriented to self and was aware that he is in a hospital, however pt disoriented to situation and providing different PLOF from social history listed in chart. Pt reports living alone in a 1-story home with walk-in-shower, however chart review reveals that pt is a resident at [facility]. Pt reports being independent with ADLs, household mobilization without assist device; per chart, home O2 at 3L (patient denies). Pt denies fall history.  Will verify PLOF with family/facility as available. Pt currently presents with decreased activity tolerance and balance, and requires SUPERVISION-MIN GUARD for standing grooming tasks, functional mobility of household distances w/out AD, and toilet transfers/hygiene d/t decreased standing balance, with pt frequently reaching for walls/furniture for external stabilization. Pt would benefit from additional skilled OT services to improve activity tolerance and balance, and prevent further deconditioning. Upon discharge, recommend home health OT.     Follow Up Recommendations  Home health OT;Supervision - Intermittent    Equipment Recommendations  None recommended by OT       Precautions / Restrictions Precautions Precautions: Fall Restrictions Weight Bearing Restrictions: No      Mobility Bed Mobility Overal bed mobility: Modified Independent                  Transfers Overall transfer level: Needs  assistance   Transfers: Sit to/from Stand Sit to Stand: Min guard              Balance Overall balance assessment: Needs assistance Sitting-balance support: No upper extremity supported;Feet supported Sitting balance-Leahy Scale: Good Sitting balance - Comments: Good sitting balance at EOB   Standing balance support: No upper extremity supported;During functional activity Standing balance-Leahy Scale: Fair Standing balance comment: Frequently reaching for walls/furniture for external stabilization                           ADL either performed or assessed with clinical judgement   ADL Overall ADL's : Needs assistance/impaired     Grooming: Wash/dry hands;Oral care;Brushing hair;Supervision/safety;Set up;Standing                   Toilet Transfer: Min guard;Ambulation;Regular Toilet;Grab bars   Toileting- Clothing Manipulation and Hygiene: Min guard;Sit to/from stand       Functional mobility during ADLs: Min guard       Vision Baseline Vision/History: Wears glasses Wears Glasses: At all times              Pertinent Vitals/Pain Pain Assessment: No/denies pain        Extremity/Trunk Assessment Upper Extremity Assessment Upper Extremity Assessment: Overall WFL for tasks assessed   Lower Extremity Assessment Lower Extremity Assessment: Defer to PT evaluation       Communication Communication Communication: No difficulties   Cognition Arousal/Alertness: Awake/alert Behavior During Therapy: WFL for tasks assessed/performed Overall Cognitive Status: No family/caregiver present to determine baseline cognitive functioning  General Comments: Pt alert and oriented to self and is aware that he is in a hospital. Disoriented to situation and PLOF is differing from social history listed in chart              Home Living Family/patient expects to be discharged to:: Assisted living                              Home Equipment: None   Additional Comments: Pt is a questionable historian. Pt reports living alone in a 1-story home with walk-in-shower, however chart review reveals that pt is a resident at [facility].      Prior Functioning/Environment Level of Independence: Independent        Comments: Pt reports being independent with ADLs, household mobilization without assist device; per chart, home O2 at 3L (patient denies).  Denies fall history.  Will verify with family/facility as available.        OT Problem List: Decreased strength;Decreased activity tolerance;Impaired balance (sitting and/or standing)      OT Treatment/Interventions: Self-care/ADL training;Therapeutic exercise;Energy conservation;DME and/or AE instruction;Therapeutic activities;Patient/family education;Balance training    OT Goals(Current goals can be found in the care plan section) Acute Rehab OT Goals Patient Stated Goal: to get back home OT Goal Formulation: With patient Time For Goal Achievement: 06/10/20 Potential to Achieve Goals: Good ADL Goals Pt Will Perform Grooming: with modified independence;standing Pt Will Transfer to Toilet: with modified independence;ambulating;regular height toilet;grab bars Pt Will Perform Toileting - Clothing Manipulation and hygiene: with modified independence;sit to/from stand  OT Frequency: Min 1X/week    AM-PAC OT "6 Clicks" Daily Activity     Outcome Measure Help from another person eating meals?: None Help from another person taking care of personal grooming?: A Little Help from another person toileting, which includes using toliet, bedpan, or urinal?: A Little Help from another person bathing (including washing, rinsing, drying)?: A Little Help from another person to put on and taking off regular upper body clothing?: A Little Help from another person to put on and taking off regular lower body clothing?: A Little 6 Click Score: 19   End  of Session Nurse Communication: Mobility status  Activity Tolerance: Patient tolerated treatment well Patient left: in bed;with call bell/phone within reach;with bed alarm set  OT Visit Diagnosis: Unsteadiness on feet (R26.81)                Time: 8144-8185 OT Time Calculation (min): 18 min Charges:  OT General Charges $OT Visit: 1 Visit OT Evaluation $OT Eval Moderate Complexity: 1 Mod OT Treatments $Self Care/Home Management : 8-22 mins  Matthew Folks, OTR/L [facility] [phone]

## 2020-05-28 DIAGNOSIS — F0391 Unspecified dementia with behavioral disturbance: Secondary | ICD-10-CM | POA: Diagnosis not present

## 2020-05-28 DIAGNOSIS — I10 Essential (primary) hypertension: Secondary | ICD-10-CM | POA: Diagnosis not present

## 2020-05-28 DIAGNOSIS — J9621 Acute and chronic respiratory failure with hypoxia: Secondary | ICD-10-CM | POA: Diagnosis not present

## 2020-05-28 LAB — CBC
HCT: 46.3 % (ref 39.0–52.0)
Hemoglobin: 13 g/dL (ref 13.0–17.0)
MCH: 24.6 pg — ABNORMAL LOW (ref 26.0–34.0)
MCHC: 28.1 g/dL — ABNORMAL LOW (ref 30.0–36.0)
MCV: 87.5 fL (ref 80.0–100.0)
Platelets: 161 10*3/uL (ref 150–400)
RBC: 5.29 MIL/uL (ref 4.22–5.81)
RDW: 15.8 % — ABNORMAL HIGH (ref 11.5–15.5)
WBC: 8.7 10*3/uL (ref 4.0–10.5)
nRBC: 0 % (ref 0.0–0.2)

## 2020-05-28 LAB — BASIC METABOLIC PANEL
Anion gap: 6 (ref 5–15)
BUN: 22 mg/dL (ref 8–23)
CO2: 43 mmol/L — ABNORMAL HIGH (ref 22–32)
Calcium: 9.3 mg/dL (ref 8.9–10.3)
Chloride: 93 mmol/L — ABNORMAL LOW (ref 98–111)
Creatinine, Ser: 0.61 mg/dL (ref 0.61–1.24)
GFR, Estimated: >60 mL/min (ref >60)
Glucose, Bld: 67 mg/dL — ABNORMAL LOW (ref 70–99)
Potassium: 4 mmol/L (ref 3.5–5.1)
Sodium: 142 mmol/L (ref 135–145)

## 2020-05-28 MED ORDER — IPRATROPIUM-ALBUTEROL 0.5-2.5 (3) MG/3ML IN SOLN
3.0000 mL | Freq: Four times a day (QID) | RESPIRATORY_TRACT | Status: DC
  Administered 2020-05-28 – 2020-05-29 (×3): 3 mL via RESPIRATORY_TRACT
  Filled 2020-05-28 (×4): qty 3

## 2020-05-28 NOTE — Progress Notes (Signed)
PROGRESS NOTE    HPI was taken from Dr. Arville Care: Melvin Campbell is a 80 y.o. Caucasian male with medical history significant for hypertension, dyslipidemia and PE with chronic respiratory failure on home O2 at 2.5 L/min by nasal cannula, and dementia who presented to the emergency room with acute onset of worsening dyspnea at his skilled nursing facility with associated hypoxemia with a pulse oximetry of mid to high 80s on 3 L of O2 by nasal cannula.  He denied any cough or wheezing, chest pain or palpitations.  No fever or chills.  No nausea or vomiting or abdominal pain.  No dysuria, oliguria or hematuria or flank pain.  The patient had a similar presentation a couple months ago when he was admitted here and his troponin at that time was over 1200.  He was managed conservatively at this time given the lack of chest pain.  When I went into his room, patient had already pulled out his IV, oxygen and monitor leads and was walking in the room trying to get out.  He seemed fairly confused initially however later he was returned to bed and was alert and oriented to his name, place and only the year but not to the day or the month.  ED Course: Upon presentation to the ER, blood pressure was 152/77 with respiratory rate of 28 and pulse oximetry of 84% on 3 L of O2 by nasal cannula and 93% on 6 L of O2 by nasal cannula.  Labs revealed unremarkable BMP and CBC.  High-sensitivity troponin I was 220.  COVID-19 PCR and influenza antigens were negative.   Hospital course from Dr. Mayford Knife 4/11-4/12/12: Pt presented w/ shortness of breath possibly secondary to emphysema. Pt did receive IV lasix x 1. Pt will continue on bronchodilators and incentive spirometry. Consider steroids if dyspnea does not improve. Of note, pt is on chronic oxygen at 3L [place] and currently pt is on 4L [place].    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 S/p IV lasix. Monitor I/Os and daily weights. Continue on bronchodilators and encourage incentive spirometry. Consider steroids if dyspnea does not improve   Elevated troponins: atypical for ACS as per cardio. IV heparin was d/c. Not a candidate for invasive evaluation. Continue on metoprolol, aspirin, eliquis, statin  Acute on chronic hypoxic respiratory failure: continue on supplemental oxygen & wean back to baseline as tolerated. Continue on bronchodilators Encourage incentive spirometry  Hx of PE in 2021: continue on home dose of eliquis   HTN: continue on losartan, metoprolol. Continue to hold HCTZ  Depression: severity unknown. Continue on home dose of lexapro   Dementia: continue on home dose of aricept, seroquel   Gout: continue on allopurinol    DVT prophylaxis: eliquis  Code Status: full  Family Communication:  Disposition Plan:.PT/OT recs HH   Level of care: Progressive Cardiac   Status is: Inpatient  Remains inpatient appropriate because:Unsafe d/c plan, IV treatments appropriate due to intensity of illness or inability to take PO and Inpatient level of care appropriate due to severity of illness   Dispo: The patient is from: Home              Anticipated d/c is to: [facility]              Patient  currently is not medically stable to d/c.   Difficult to place patient Yes      Consultants:   Cardio    Procedures:    Antimicrobials:    Subjective: Pt c/o shortness of breath   Objective: Vitals:   05/27/20 2033 05/27/20 2320 05/28/20 0359 05/28/20 0758  BP: 132/67 124/75 118/65 117/71  Pulse: 63 65 67 80  Resp: 20 16 18 20   Temp: 98 F (36.7 C) 98 F (36.7 C) 98.8 F (37.1 C) 98.1 F (36.7 C)  TempSrc: Axillary Oral Oral Oral  SpO2: 92% 95% 94% 92%  Weight:      Height:        Intake/Output Summary (Last 24 hours) at 05/28/2020  0819 Last data filed at 05/27/2020 2300 Gross per 24 hour  Intake 960 ml  Output 615 ml  Net 345 ml   Filed Weights   05/25/20 2105 05/26/20 0127 05/27/20 0500  Weight: 82 kg 82.9 kg 83.8 kg    Examination:  General exam: Appears comfortable   Respiratory system: decreased breath sounds b/l  Cardiovascular system: S1/S2+. No rubs or clicks  Gastrointestinal system: Abd is soft, NT, ND, & hypoactive bowel sounds  Central nervous system: Alert and awake. Moves all 4 extremities   Psychiatry: Judgement and insight appear abnormal.  Flat mood and affect    Data Reviewed: I have personally reviewed following labs and imaging studies  CBC: Recent Labs  Lab 05/25/20 2116 05/28/20 0523  WBC 6.5 8.7  NEUTROABS 4.4  --   HGB 14.3 13.0  HCT 49.5 46.3  MCV 86.2 87.5  PLT 158 161   Basic Metabolic Panel: Recent Labs  Lab 05/25/20 2116 05/26/20 0547 05/27/20 0415 05/28/20 0523  NA 143 144 143 142  K 3.7 4.3 4.0 4.0  CL 96* 95* 97* 93*  CO2 42* 38* 41* 43*  GLUCOSE 114* 122* 97 67*  BUN 15 15 25* 22  CREATININE 0.54* 0.49* 0.68 0.61  CALCIUM 9.6 9.5 9.2 9.3   GFR: Estimated Creatinine Clearance: 77.5 mL/min (by C-G formula based on SCr of 0.61 mg/dL). Liver Function Tests: No results for input(s): AST, ALT, ALKPHOS, BILITOT, PROT, ALBUMIN in the last 168 hours. No results for input(s): LIPASE, AMYLASE in the last 168 hours. No results for input(s): AMMONIA in the last 168 hours. Coagulation Profile: Recent Labs  Lab 05/26/20 0035  INR 1.2   Cardiac Enzymes: No results for input(s): CKTOTAL, CKMB, CKMBINDEX, TROPONINI in the last 168 hours. BNP (last 3 results) No results for input(s): PROBNP in the last 8760 hours. HbA1C: No results for input(s): HGBA1C in the last 72 hours. CBG: No results for input(s): GLUCAP in the last 168 hours. Lipid Profile: No results for input(s): CHOL, HDL, LDLCALC, TRIG, CHOLHDL, LDLDIRECT in the last 72 hours. Thyroid Function  Tests: No results for input(s): TSH, T4TOTAL, FREET4, T3FREE, THYROIDAB in the last 72 hours. Anemia Panel: No results for input(s): VITAMINB12, FOLATE, FERRITIN, TIBC, IRON, RETICCTPCT in the last 72 hours. Sepsis Labs: Recent Labs  Lab 05/26/20 0035  PROCALCITON <0.10    Recent Results (from the past 240 hour(s))  Resp Panel by RT-PCR (Flu A&B, Covid) Nasopharyngeal Swab     Status: None   Collection Time: 05/25/20  9:16 PM   Specimen: Nasopharyngeal Swab; Nasopharyngeal(NP) swabs in vial transport medium  Result Value Ref Range Status   SARS Coronavirus 2 by RT PCR NEGATIVE NEGATIVE Final    Comment: (NOTE) SARS-CoV-2 target nucleic acids are NOT  DETECTED.  The SARS-CoV-2 RNA is generally detectable in upper respiratory specimens during the acute phase of infection. The lowest concentration of SARS-CoV-2 viral copies this assay can detect is 138 copies/mL. A negative result does not preclude SARS-Cov-2 infection and should not be used as the sole basis for treatment or other patient management decisions. A negative result may occur with  improper specimen collection/handling, submission of specimen other than nasopharyngeal swab, presence of viral mutation(s) within the areas targeted by this assay, and inadequate number of viral copies(<138 copies/mL). A negative result must be combined with clinical observations, patient history, and epidemiological information. The expected result is Negative.  Fact Sheet for Patients:  BloggerCourse.com  Fact Sheet for Healthcare Providers:  SeriousBroker.it  This test is no t yet approved or cleared by the [place] FDA and  has been authorized for detection and/or diagnosis of SARS-CoV-2 by FDA under an Emergency Use Authorization (EUA). This EUA will remain  in effect (meaning this test can be used) for the duration of the COVID-19 declaration under Section 564(b)(1) of the Act,  21 U.S.C.section 360bbb-3(b)(1), unless the authorization is terminated  or revoked sooner.       Influenza A by PCR NEGATIVE NEGATIVE Final   Influenza B by PCR NEGATIVE NEGATIVE Final    Comment: (NOTE) The Xpert Xpress SARS-CoV-2/FLU/RSV plus assay is intended as an aid in the diagnosis of influenza from Nasopharyngeal swab specimens and should not be used as a sole basis for treatment. Nasal washings and aspirates are unacceptable for Xpert Xpress SARS-CoV-2/FLU/RSV testing.  Fact Sheet for Patients: BloggerCourse.com  Fact Sheet for Healthcare Providers: SeriousBroker.it  This test is not yet approved or cleared by the [place] FDA and has been authorized for detection and/or diagnosis of SARS-CoV-2 by FDA under an Emergency Use Authorization (EUA). This EUA will remain in effect (meaning this test can be used) for the duration of the COVID-19 declaration under Section 564(b)(1) of the Act, 21 U.S.C. section 360bbb-3(b)(1), unless the authorization is terminated or revoked.  Performed at [facility], [address], [place], [place] [address]          Radiology Studies: No results found.      Scheduled Meds: . allopurinol  300 mg Oral Daily  . apixaban  5 mg Oral BID  . aspirin EC  81 mg Oral Daily  . atorvastatin  80 mg Oral QHS  . budesonide  0.25 mg Nebulization BID  . donepezil  5 mg Oral Daily  . erythromycin  1 application Right Eye TID  . escitalopram  20 mg Oral Daily  . folic acid  1 mg Oral Daily  . losartan  100 mg Oral Daily  . magnesium oxide  400 mg Oral BID  . metoprolol succinate  75 mg Oral Daily  . multivitamin with minerals  1 tablet Oral Daily  . polyvinyl alcohol  1 drop Right Eye Q6H  . prednisoLONE acetate  1 drop Right Eye BID  . QUEtiapine  25 mg Oral QHS  . thiamine  100 mg Oral Daily   Continuous Infusions:   LOS: 2 days    Time spent: 33 mins      Charise Killian, MD [facility] Pager [phone]  If 7PM-7AM, please contact night-coverage 05/28/2020, 8:19 AM

## 2020-05-28 NOTE — Progress Notes (Signed)
Patient Name: Melvin Campbell Date of Encounter: 05/28/2020  Hospital Problem List     Active Problems:   ACS (acute coronary syndrome) [facility])    Patient Profile     80 y.o.malewith history ofdementia, hypertension, dyslipidemia, history of PE on Eliquis, and chronic respiratory failure on home O2.He was brought to the emergency room with acute worsening dyspnea at his skilled nursing facility associated with hypoxia with pulse ox in the mid to high 80s. He had no fever or chills. He denied any chest pain. He had presented approximately 1 to 2 months ago with similar complaints. Had a troponin of 1200 was managed conservatively of due to the lack of chest pain and comorbidities. He had confusion in the emergency room pulling his IVs out. His troponin was 220. Covid negative. Subsequent troponin was 245. EKG showed sinus rhythm with chronic left bundle branch block. Chest x-ray revealed bilateral interstitial pulmonary opacities similar to his previous chest x-ray. CT of the chest was negative for pulmonary embolism. There was a small left pleural effusion emphysema and coronary calcification. Similar to CT done in February 2022. Patient was treated with Eliquis as an outpatient 5 mg twice daily for his pulmonary embolus.   Subjective   Somewhat more alert today still has some confusion.  Inpatient Medications    . allopurinol  300 mg Oral Daily  . apixaban  5 mg Oral BID  . aspirin EC  81 mg Oral Daily  . atorvastatin  80 mg Oral QHS  . budesonide  0.25 mg Nebulization BID  . donepezil  5 mg Oral Daily  . erythromycin  1 application Right Eye TID  . escitalopram  20 mg Oral Daily  . folic acid  1 mg Oral Daily  . losartan  100 mg Oral Daily  . magnesium oxide  400 mg Oral BID  . metoprolol succinate  75 mg Oral Daily  . multivitamin with minerals  1 tablet Oral Daily  . polyvinyl alcohol  1 drop Right Eye Q6H  . prednisoLONE acetate  1 drop Right Eye BID  .  QUEtiapine  25 mg Oral QHS  . thiamine  100 mg Oral Daily    Vital Signs    Vitals:   05/28/20 0359 05/28/20 0758 05/28/20 0818 05/28/20 1200  BP: 118/65 117/71  (!) 103/49  Pulse: 67 80  61  Resp: 18 20    Temp: 98.8 F (37.1 C) 98.1 F (36.7 C)  98.2 F (36.8 C)  TempSrc: Oral Oral  Oral  SpO2: 94% 92% 90% 94%  Weight:      Height:        Intake/Output Summary (Last 24 hours) at 05/28/2020 1237 Last data filed at 05/28/2020 1052 Gross per 24 hour  Intake 720 ml  Output 475 ml  Net 245 ml   Filed Weights   05/25/20 2105 05/26/20 0127 05/27/20 0500  Weight: 82 kg 82.9 kg 83.8 kg    Physical Exam    GEN: Well nourished, well developed, in no acute distress.  HEENT: normal.  Neck: Supple, no JVD, carotid bruits, or masses. Cardiac: irr, irr Respiratory:  Respirations regular and unlabored, clear to auscultation bilaterally. GI: Soft, nontender, nondistended, BS + x 4. MS: no deformity or atrophy. Skin: warm and dry, no rash. Neuro:  Strength and sensation are intact. Psych: Normal affect.  Labs    CBC Recent Labs    05/25/20 2116 05/28/20 0523  WBC 6.5 8.7  NEUTROABS 4.4  --  HGB 14.3 13.0  HCT 49.5 46.3  MCV 86.2 87.5  PLT 158 161   Basic Metabolic Panel Recent Labs    43/32/95 0415 05/28/20 0523  NA 143 142  K 4.0 4.0  CL 97* 93*  CO2 41* 43*  GLUCOSE 97 67*  BUN 25* 22  CREATININE 0.68 0.61  CALCIUM 9.2 9.3   Liver Function Tests No results for input(s): AST, ALT, ALKPHOS, BILITOT, PROT, ALBUMIN in the last 72 hours. No results for input(s): LIPASE, AMYLASE in the last 72 hours. Cardiac Enzymes No results for input(s): CKTOTAL, CKMB, CKMBINDEX, TROPONINI in the last 72 hours. BNP Recent Labs    05/26/20 0035 05/27/20 0415  BNP 386.4* 524.0*   D-Dimer No results for input(s): DDIMER in the last 72 hours. Hemoglobin A1C No results for input(s): HGBA1C in the last 72 hours. Fasting Lipid Panel No results for input(s): CHOL, HDL,  LDLCALC, TRIG, CHOLHDL, LDLDIRECT in the last 72 hours. Thyroid Function Tests No results for input(s): TSH, T4TOTAL, T3FREE, THYROIDAB in the last 72 hours.  Invalid input(s): FREET3  Telemetry    afib with controlled vr  ECG    nsr with bbb  Radiology    DG Chest 2 View  Result Date: 05/25/2020 CLINICAL DATA:  Shortness of breath EXAM: CHEST - 2 VIEW COMPARISON:  04/10/2020 FINDINGS: The heart size and mediastinal contours are within normal limits. Low volume examination with diffuse bilateral interstitial pulmonary opacity and atelectasis or consolidation of the lung bases, generally similar in appearance to prior examination. Disc degenerative disease of the thoracic spine. IMPRESSION: Low volume examination with diffuse bilateral interstitial pulmonary opacity and atelectasis or consolidation of the lung bases, generally similar in appearance to prior examination. Findings may reflect edema and/or chronic interstitial change. No new or focal airspace opacity. Electronically Signed   By: Lauralyn Primes M.D.   On: 05/25/2020 21:58   CT Angio Chest PE W and/or Wo Contrast  Result Date: 05/25/2020 CLINICAL DATA:  Shortness of breath EXAM: CT ANGIOGRAPHY CHEST WITH CONTRAST TECHNIQUE: Multidetector CT imaging of the chest was performed using the standard protocol during bolus administration of intravenous contrast. Multiplanar CT image reconstructions and MIPs were obtained to evaluate the vascular anatomy. CONTRAST:  71mL OMNIPAQUE IOHEXOL 350 MG/ML SOLN COMPARISON:  03/31/2020 FINDINGS: Cardiovascular: Satisfactory opacification of the pulmonary arteries to the segmental level. No evidence of pulmonary embolism. Cardiomegaly. Three-vessel coronary artery calcifications. No pericardial effusion. Aortic atherosclerosis. Mediastinum/Nodes: No enlarged mediastinal, hilar, or axillary lymph nodes. Thyroid gland, trachea, and esophagus demonstrate no significant findings. Lungs/Pleura: Small left  pleural effusion. Low pulmonary volumes with extensive dependent bibasilar atelectasis and/or consolidation, similar in appearance to prior examination. Mild centrilobular emphysema. Upper Abdomen: No acute abnormality. Musculoskeletal: No chest wall abnormality. No acute or significant osseous findings. Review of the MIP images confirms the above findings. IMPRESSION: 1. Negative examination for pulmonary embolism. 2. Small left pleural effusion. 3. Low pulmonary volumes with extensive dependent bibasilar atelectasis and/or consolidation, similar in appearance to prior examination. 4. Emphysema. 5. Coronary artery disease. Aortic Atherosclerosis (ICD10-I70.0) and Emphysema (ICD10-J43.9). Electronically Signed   By: Lauralyn Primes M.D.   On: 05/25/2020 22:55    Assessment & Plan     79 y.o.malewith history ofdementia, hypertension, dyslipidemia, history of PE on Eliquis, and chronic respiratory failure on home O2.He was brought to the emergency room with acute worsening dyspnea at his skilled nursing facility associated with hypoxia with pulse ox in the mid to high 80s. He had  no fever or chills. He denied any chest pain. He had presented approximately 1 to 2 months ago with similar complaints. Had a troponin of 1200 was managed conservatively of due to the lack of chest pain and comorbidities. He had confusion in the emergency room pulling his IVs out. His troponin was 220. Covid negative. Subsequent troponin was 245. EKG showed sinus rhythm with chronic left bundle branch block.Chest x-ray revealed bilateral interstitial pulmonary opacities similar to his previous chest x-ray. CT of the chest was negative for pulmonary embolism. There was a small left pleural effusion emphysema and coronary calcification. Similar to CT done in February 2022. Patient was treated with Eliquis as an outpatient 5 mg twice daily for his pulmonary embolus. Patient was taken off of Eliquis and placed on heparin.  Continue with aspirin Toprol and statin.Troponins have been flat during this admission again. Patient again has no chest pain. No EKG changes consistent with ischemia.  1. Abnormal troponin-somewhat atypical for acute coronary syndrome. Troponin was flat in the mid 200s. He had a similar presentation with dyspnea with flat troponins in the 1200s several months ago. Treated medically. Echocardiogram 2 months ago showed an EF of 60 to 65%. Had LVH. EKG unremarkable. Will discontinue heparin and attempt medical management. Does not appear to be a candidate for invasive evaluation at present. Will continue with Eliquis.  2. Dyspnea-etiology unclear. Chest CT showed emphysema likely in etiology. Small left pleural effusion. No pulmonary embolism. Coronary calcification. No appreciable change from previously. Chest x-ray showed diffuse bilateral interstitial pulmonary opacity.  mild edema. Would continue with incentive spirometry and bronchodilators.  BNP increased to 524 yesterday from 386.  Creatinine slightly elevated to 0.61 but still in normal range.  It is 1 L up from admission.  3.  Atrial fibrillation-Per telemetry appears to have developed A. fib with good rate control.  We will continue with metoprolol and remain on Eliquis.  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  Will follow rate.   Signed, Darlin Priestly Jayan Raymundo MD 05/28/2020, 12:37 PM  Pager: [phone]

## 2020-05-28 NOTE — Progress Notes (Signed)
Mobility Specialist - Progress Note   05/28/20 1400  Mobility  Activity Ambulated in hall  Level of Assistance Minimal assist, patient does 75% or more  Assistive Device None  Distance Ambulated (ft) 100 ft  Mobility Response Tolerated well  Mobility performed by Mobility specialist  $Mobility charge 1 Mobility    Pre-mobility: 68 HR, 91% SpO2 During mobility: 77 HR, 87% SpO2 Post-mobility: 63 HR, 93% SpO2   O2 sat at 86% on arrival as pt has [place] sitting on forehead. Reapplied to rebound sats to 91% prior to activity. Pt ambulated in hallway without AD. Utilizing 5L on arrival, 6L portable O2. Furniture surfing for steadiness, but no LOB noted. O2 desat to a low of 87% during ambulation. Rest and PLB increased sats to 93% once returned to bed.    Filiberto Pinks Mobility Specialist 05/28/20, 2:36 PM

## 2020-05-28 NOTE — TOC Initial Note (Signed)
Transition of Care [facility]) - Initial/Assessment Note    Patient Details  Name: Melvin Campbell MRN: [id number] Date of Birth: 09/11/1940  [facility] CM/SW Contact:    Hetty Ely, RN Phone Number: 05/28/2020, 1:52 PM  Clinical Narrative: TOC spoke with patient who says he lives alone, however daughter Tobi Bastos helps out a lot. Uses [facility] in [place], daughter or neighbor picks up meds. Shopping and Cooking done by daughter. No home oxygen nor other DME. States he does not need ambulatory device and able to do ADL's independently. Denies use of HH services. TOC will continue to track for discharge needs.                 Expected Discharge Plan: Home w Home Health Services Barriers to Discharge: Continued Medical Work up   Patient Goals and CMS Choice Patient states their goals for this hospitalization and ongoing recovery are:: To return home in [place].   Choice offered to / list presented to : NA  Expected Discharge Plan and Services Expected Discharge Plan: Home w Home Health Services     Post Acute Care Choice: Home Health Living arrangements for the past 2 months: Single Family Home                                      Prior Living Arrangements/Services Living arrangements for the past 2 months: Single Family Home Lives with:: Self Patient language and need for interpreter reviewed:: Yes Do you feel safe going back to the place where you live?: Yes      Need for Family Participation in Patient Care: No (Comment) Care giver support system in place?: Yes (comment)   Criminal Activity/Legal Involvement Pertinent to Current Situation/Hospitalization: No - Comment as needed  Activities of Daily Living Home Assistive Devices/Equipment: None ADL Screening (condition at time of admission) Patient's cognitive ability adequate to safely complete daily activities?: Yes Is the patient deaf or have difficulty hearing?: No Does the patient have difficulty seeing,  even when wearing glasses/contacts?: No Does the patient have difficulty concentrating, remembering, or making decisions?: No Patient able to express need for assistance with ADLs?: Yes Does the patient have difficulty dressing or bathing?: No Independently performs ADLs?: Yes (appropriate for developmental age) Does the patient have difficulty walking or climbing stairs?: Yes Weakness of Legs: None Weakness of Arms/Hands: None  Permission Sought/Granted                  Emotional Assessment Appearance:: Appears stated age Attitude/Demeanor/Rapport: Self-Confident Affect (typically observed): Stable Orientation: : Oriented to Self,Oriented to Place,Oriented to Situation Alcohol / Substance Use: Not Applicable Psych Involvement: No (comment)  Admission diagnosis:  Shortness of breath [R06.02] Hypoxia [R09.02] ACS (acute coronary syndrome) (HCC) [I24.9] Elevated troponin [R77.8] Patient Active Problem List   Diagnosis Date Noted  . ACS (acute coronary syndrome) (HCC) 05/25/2020  . Acute on chronic respiratory failure with hypoxia (HCC) 03/31/2020  . Elevated troponin 03/31/2020  . Community acquired pneumonia 03/31/2020  . Bilateral lower extremity edema 03/31/2020   PCP:  Patient, No Pcp Per (Inactive) Pharmacy:  No Pharmacies Listed    Social Determinants of Health (SDOH) Interventions    Readmission Risk Interventions No flowsheet data found.

## 2020-05-29 DIAGNOSIS — R0602 Shortness of breath: Secondary | ICD-10-CM | POA: Diagnosis not present

## 2020-05-29 DIAGNOSIS — R0902 Hypoxemia: Secondary | ICD-10-CM

## 2020-05-29 DIAGNOSIS — R778 Other specified abnormalities of plasma proteins: Secondary | ICD-10-CM | POA: Diagnosis not present

## 2020-05-29 LAB — BASIC METABOLIC PANEL
Anion gap: 5 (ref 5–15)
BUN: 23 mg/dL (ref 8–23)
CO2: 43 mmol/L — ABNORMAL HIGH (ref 22–32)
Calcium: 9.4 mg/dL (ref 8.9–10.3)
Chloride: 94 mmol/L — ABNORMAL LOW (ref 98–111)
Creatinine, Ser: 0.65 mg/dL (ref 0.61–1.24)
GFR, Estimated: >60 mL/min (ref >60)
Glucose, Bld: 82 mg/dL (ref 70–99)
Potassium: 5 mmol/L (ref 3.5–5.1)
Sodium: 142 mmol/L (ref 135–145)

## 2020-05-29 LAB — CBC
HCT: 44.7 % (ref 39.0–52.0)
Hemoglobin: 12.8 g/dL — ABNORMAL LOW (ref 13.0–17.0)
MCH: 24.7 pg — ABNORMAL LOW (ref 26.0–34.0)
MCHC: 28.6 g/dL — ABNORMAL LOW (ref 30.0–36.0)
MCV: 86.3 fL (ref 80.0–100.0)
Platelets: 147 10*3/uL — ABNORMAL LOW (ref 150–400)
RBC: 5.18 MIL/uL (ref 4.22–5.81)
RDW: 15.9 % — ABNORMAL HIGH (ref 11.5–15.5)
WBC: 6.8 10*3/uL (ref 4.0–10.5)
nRBC: 0 % (ref 0.0–0.2)

## 2020-05-29 MED ORDER — PREDNISONE 50 MG PO TABS: ORAL_TABLET | ORAL | 0 refills | Status: DC

## 2020-05-29 MED ORDER — METOPROLOL SUCCINATE ER 25 MG PO TB24: 75.0000 mg | ORAL_TABLET | Freq: Every day | ORAL | Status: DC

## 2020-05-29 NOTE — TOC Transition Note (Signed)
Transition of Care [facility]) - CM/SW Discharge Note   Patient Details  Name: Melvin Campbell MRN: [id number] Date of Birth: 20-Mar-1940  [facility] of [facility]) CM/SW Contact:  Hetty Ely, RN Phone Number: 05/29/2020, 2:23 PM   Clinical Narrative: Patient to be discharged back to [facility] room 110.  Keda received Discharge Summary and FL2 , approved admission no other needs. Transport arranged, TOC barriers resolved.    Final next level of care: Assisted Living Barriers to Discharge: Barriers Resolved   Patient Goals and CMS Choice Patient states their goals for this hospitalization and ongoing recovery are:: To return to ALF.   Choice offered to / list presented to : NA  Discharge Placement                Patient to be transferred to facility by: [facility] Name of family member notified: Jobany Montellano Patient and family notified of of transfer: 05/29/20  Discharge Plan and Services     Post Acute Care Choice: Home Health          DME Arranged: N/A DME Agency: NA       HH Arranged: NA HH Agency: NA        Social Determinants of Health (SDOH) Interventions     Readmission Risk Interventions No flowsheet data found.

## 2020-05-29 NOTE — Progress Notes (Signed)
[place] [facility] [place] [facility] [address] with [place] Time allowed for questions and concerns Verbalizes an understanding on discharge planning Denies any additional questions or concerns Awaiting medical transport to discharge to facility

## 2020-05-29 NOTE — Plan of Care (Signed)

## 2020-05-29 NOTE — Care Management Important Message (Signed)
Important Message  Patient Details  Name: Melvin Campbell MRN: [id number] Date of Birth: [birth year]   Medicare Important Message Given:  Yes  Reviewed Medicare IM with Francis Dowse, daughter, at [phone].  Copy sent securely to daughter's attention at email address provided on chart: [email or web address]  Copy of Medicare IM also left in patient's room for reference.    Johnell Comings 05/29/2020, 11:20 AM

## 2020-05-29 NOTE — Discharge Summary (Addendum)
Physician Discharge Summary  Donevan Biller Haran FHL:[id number] DOB: 1940-03-08 DOA: 05/25/2020  PCP: Patient, No Pcp Per (Inactive)  Admit date: 05/25/2020 Discharge date: 05/29/2020  Admitted From: SNF Disposition: SNF  Recommendations for Outpatient Follow-up:  1. Follow up with PCP in 1-2 weeks 2. Follow-up with cardiology 3. Please obtain BMP/CBC in one week 4. Please follow up on the following pending results: None  Home Health: No Equipment/Devices: Home oxygen Discharge Condition: Stable CODE STATUS: DNR Diet recommendation: Heart Healthy  Brief/Interim Summary: Melvin Neto Salcicciais a 80 y.o.Caucasian malewith medical history significant forhypertension, dyslipidemia and PE with chronic respiratory failure on home O2 at 3 L/min by nasal cannula, and dementia who presented to the emergency room with acute onset of worsening dyspnea at his skilled nursing facility with associated hypoxemia with a pulse oximetry ofmid to high 80s on 3 L of O2 by nasal cannula.He denied any cough or wheezing, chest pain or palpitations. No fever or chills. No nausea or vomiting or abdominal pain. No dysuria, oliguria or hematuria or flank pain. The patient had a similar presentation a couple months ago when he was admitted here and his troponin at that time was over 1200. He was managed conservatively at this time given the lack of chest pain.  Upon presentation to the ER, blood pressure was 152/77 with respiratory rate of 28 and pulse oximetry of 84% on 3 L of O2 by nasal cannula and 93% on 6 L of O2 by nasal cannula. Labs revealed unremarkable BMP and CBC. High-sensitivity troponin I was 220. COVID-19 PCR and influenza antigens were negative.  Cardiology was consulted due to elevated troponin, he is not a candidate for any invasive evaluations and they are recommending continue medical management with metoprolol, aspirin, Eliquis and statin.  His shortness of breath was thought to be due to  underlying advanced emphysema.  Patient saturating well on 4 L of oxygen, may be his new baseline.  He was having some scattered expiratory wheeze and was given a 5-day course of prednisone on discharge.  Patient will continue his home dose of losartan, Lasix and metoprolol on discharge.  We discontinue HCTZ as his blood pressure was within normal limit without it.  Patient has an history of PE in 2021.  And will continue the home dose of Eliquis and follow-up with his providers for further recommendations about the duration.  Patient has an history of depression, remained stable, no acute concern, continue home dose of Lexapro  History of gout, no exacerbation, continue allopurinol  History of dementia and he will continue his home dose of Aricept and Seroquel.  Patient will follow up with his providers for further management.  Discharge Diagnoses:  Active Problems:   ACS (acute coronary syndrome) [facility])   Discharge Instructions  Discharge Instructions    Diet - low sodium heart healthy   Complete by: As directed    Increase activity slowly   Complete by: As directed      Allergies as of 05/29/2020      Reactions   Amlodipine       Medication List    STOP taking these medications   erythromycin ophthalmic ointment   hydrochlorothiazide 25 MG tablet Commonly known as: HYDRODIURIL     TAKE these medications   acetaminophen 325 MG tablet Commonly known as: TYLENOL Take 650 mg by mouth every 4 (four) hours as needed.   albuterol 108 (90 Base) MCG/ACT inhaler Commonly known as: VENTOLIN HFA Inhale 2 puffs into the lungs  every 4 (four) hours as needed.   allopurinol 300 MG tablet Commonly known as: ZYLOPRIM Take 300 mg by mouth daily.   Artificial Tears 1.4 % ophthalmic solution Generic drug: polyvinyl alcohol Place 1 drop into the right eye every 6 (six) hours.   aspirin 81 MG EC tablet Take 1 tablet (81 mg total) by mouth daily. Swallow whole.   atorvastatin  80 MG tablet Commonly known as: LIPITOR Take 1 tablet (80 mg total) by mouth daily.   B-1 100 MG Tabs Take 1 tablet by mouth daily.   budesonide 0.25 MG/2ML nebulizer solution Commonly known as: PULMICORT Take 2 mLs (0.25 mg total) by nebulization 2 (two) times daily.   donepezil 5 MG tablet Commonly known as: ARICEPT Take 5 mg by mouth daily.   Eliquis 5 MG Tabs tablet Generic drug: apixaban Take 5 mg by mouth 2 (two) times daily.   escitalopram 20 MG tablet Commonly known as: LEXAPRO Take 20 mg by mouth daily.   folic acid 1 MG tablet Commonly known as: FOLVITE Take 1 mg by mouth daily.   furosemide 20 MG tablet Commonly known as: LASIX Take 20 mg by mouth daily as needed.   losartan 100 MG tablet Commonly known as: COZAAR Take 100 mg by mouth daily.   magnesium oxide 400 (241.3 Mg) MG tablet Commonly known as: MAG-OX Take 1 tablet by mouth 2 (two) times daily.   metoprolol succinate 25 MG 24 hr tablet Commonly known as: TOPROL-XL Take 3 tablets (75 mg total) by mouth daily. Take with or immediately following a meal. Start taking on: May 30, 2020 What changed:   medication strength  how much to take  additional instructions  Another medication with the same name was removed. Continue taking this medication, and follow the directions you see here.   Multi-Day Plus Minerals Tabs Take 1 tablet by mouth daily.   nitroGLYCERIN 0.4 MG SL tablet Commonly known as: NITROSTAT Place 1 tablet (0.4 mg total) under the tongue every 5 (five) minutes x 3 doses as needed for chest pain.   prednisoLONE acetate 1 % ophthalmic suspension Commonly known as: PRED FORTE Administer 1 drop to the right eye two (2) times a day.   predniSONE 50 MG tablet Commonly known as: DELTASONE Take 1 tablet daily for 5 days.   QUEtiapine 25 MG tablet Commonly known as: SEROQUEL Take 25 mg by mouth at bedtime.       Allergies  Allergen Reactions  . Amlodipine      Consultations:  Cardiology  Procedures/Studies: DG Chest 2 View  Result Date: 05/25/2020 CLINICAL DATA:  Shortness of breath EXAM: CHEST - 2 VIEW COMPARISON:  04/10/2020 FINDINGS: The heart size and mediastinal contours are within normal limits. Low volume examination with diffuse bilateral interstitial pulmonary opacity and atelectasis or consolidation of the lung bases, generally similar in appearance to prior examination. Disc degenerative disease of the thoracic spine. IMPRESSION: Low volume examination with diffuse bilateral interstitial pulmonary opacity and atelectasis or consolidation of the lung bases, generally similar in appearance to prior examination. Findings may reflect edema and/or chronic interstitial change. No new or focal airspace opacity. Electronically Signed   By: Lauralyn PrimesAlex  Bibbey M.D.   On: 05/25/2020 21:58   CT Angio Chest PE W and/or Wo Contrast  Result Date: 05/25/2020 CLINICAL DATA:  Shortness of breath EXAM: CT ANGIOGRAPHY CHEST WITH CONTRAST TECHNIQUE: Multidetector CT imaging of the chest was performed using the standard protocol during bolus administration of intravenous contrast. Multiplanar  CT image reconstructions and MIPs were obtained to evaluate the vascular anatomy. CONTRAST:  60mL OMNIPAQUE IOHEXOL 350 MG/ML SOLN COMPARISON:  03/31/2020 FINDINGS: Cardiovascular: Satisfactory opacification of the pulmonary arteries to the segmental level. No evidence of pulmonary embolism. Cardiomegaly. Three-vessel coronary artery calcifications. No pericardial effusion. Aortic atherosclerosis. Mediastinum/Nodes: No enlarged mediastinal, hilar, or axillary lymph nodes. Thyroid gland, trachea, and esophagus demonstrate no significant findings. Lungs/Pleura: Small left pleural effusion. Low pulmonary volumes with extensive dependent bibasilar atelectasis and/or consolidation, similar in appearance to prior examination. Mild centrilobular emphysema. Upper Abdomen: No acute abnormality.  Musculoskeletal: No chest wall abnormality. No acute or significant osseous findings. Review of the MIP images confirms the above findings. IMPRESSION: 1. Negative examination for pulmonary embolism. 2. Small left pleural effusion. 3. Low pulmonary volumes with extensive dependent bibasilar atelectasis and/or consolidation, similar in appearance to prior examination. 4. Emphysema. 5. Coronary artery disease. Aortic Atherosclerosis (ICD10-I70.0) and Emphysema (ICD10-J43.9). Electronically Signed   By: Lauralyn Primes M.D.   On: 05/25/2020 22:55    Subjective: Patient was seen and examined today.  No new complaint.  Saturating well on 4 L of oxygen, may be his new baseline as he was on 3 L before.  Discharge Exam: Vitals:   05/29/20 0759 05/29/20 0931  BP:    Pulse:  70  Resp:    Temp:    SpO2: 92%    Vitals:   05/29/20 0557 05/29/20 0725 05/29/20 0759 05/29/20 0931  BP:  131/72    Pulse:  61  70  Resp:  19    Temp:  98.3 F (36.8 C)    TempSrc:  Oral    SpO2:  92% 92%   Weight: 81.5 kg     Height:        General: Pt is alert, awake, not in acute distress Cardiovascular: RRR, S1/S2 +, no rubs, no gallops Respiratory: Bilateral scattered expiratory wheeze. Abdominal: Soft, NT, ND, bowel sounds + Extremities: no edema, no cyanosis   The results of significant diagnostics from this hospitalization (including imaging, microbiology, ancillary and laboratory) are listed below for reference.    Microbiology: Recent Results (from the past 240 hour(s))  Resp Panel by RT-PCR (Flu A&B, Covid) Nasopharyngeal Swab     Status: None   Collection Time: 05/25/20  9:16 PM   Specimen: Nasopharyngeal Swab; Nasopharyngeal(NP) swabs in vial transport medium  Result Value Ref Range Status   SARS Coronavirus 2 by RT PCR NEGATIVE NEGATIVE Final    Comment: (NOTE) SARS-CoV-2 target nucleic acids are NOT DETECTED.  The SARS-CoV-2 RNA is generally detectable in upper respiratory specimens during the  acute phase of infection. The lowest concentration of SARS-CoV-2 viral copies this assay can detect is 138 copies/mL. A negative result does not preclude SARS-Cov-2 infection and should not be used as the sole basis for treatment or other patient management decisions. A negative result may occur with  improper specimen collection/handling, submission of specimen other than nasopharyngeal swab, presence of viral mutation(s) within the areas targeted by this assay, and inadequate number of viral copies(<138 copies/mL). A negative result must be combined with clinical observations, patient history, and epidemiological information. The expected result is Negative.  Fact Sheet for Patients:  BloggerCourse.com  Fact Sheet for Healthcare Providers:  SeriousBroker.it  This test is no t yet approved or cleared by the [place] FDA and  has been authorized for detection and/or diagnosis of SARS-CoV-2 by FDA under an Emergency Use Authorization (EUA). This EUA will remain  in  effect (meaning this test can be used) for the duration of the COVID-19 declaration under Section 564(b)(1) of the Act, 21 U.S.C.section 360bbb-3(b)(1), unless the authorization is terminated  or revoked sooner.       Influenza A by PCR NEGATIVE NEGATIVE Final   Influenza B by PCR NEGATIVE NEGATIVE Final    Comment: (NOTE) The Xpert Xpress SARS-CoV-2/FLU/RSV plus assay is intended as an aid in the diagnosis of influenza from Nasopharyngeal swab specimens and should not be used as a sole basis for treatment. Nasal washings and aspirates are unacceptable for Xpert Xpress SARS-CoV-2/FLU/RSV testing.  Fact Sheet for Patients: BloggerCourse.com  Fact Sheet for Healthcare Providers: SeriousBroker.it  This test is not yet approved or cleared by the [place] FDA and has been authorized for detection and/or  diagnosis of SARS-CoV-2 by FDA under an Emergency Use Authorization (EUA). This EUA will remain in effect (meaning this test can be used) for the duration of the COVID-19 declaration under Section 564(b)(1) of the Act, 21 U.S.C. section 360bbb-3(b)(1), unless the authorization is terminated or revoked.  Performed at [facility], [address] [place], [place] [address]      Labs: BNP (last 3 results) Recent Labs    04/08/20 0515 05/26/20 0035 05/27/20 0415  BNP 172.8* 386.4* 524.0*   Basic Metabolic Panel: Recent Labs  Lab 05/25/20 2116 05/26/20 0547 05/27/20 0415 05/28/20 0523 05/29/20 0603  NA 143 144 143 142 142  K 3.7 4.3 4.0 4.0 5.0  CL 96* 95* 97* 93* 94*  CO2 42* 38* 41* 43* 43*  GLUCOSE 114* 122* 97 67* 82  BUN 15 15 25* 22 23  CREATININE 0.54* 0.49* 0.68 0.61 0.65  CALCIUM 9.6 9.5 9.2 9.3 9.4   Liver Function Tests: No results for input(s): AST, ALT, ALKPHOS, BILITOT, PROT, ALBUMIN in the last 168 hours. No results for input(s): LIPASE, AMYLASE in the last 168 hours. No results for input(s): AMMONIA in the last 168 hours. CBC: Recent Labs  Lab 05/25/20 2116 05/28/20 0523 05/29/20 0603  WBC 6.5 8.7 6.8  NEUTROABS 4.4  --   --   HGB 14.3 13.0 12.8*  HCT 49.5 46.3 44.7  MCV 86.2 87.5 86.3  PLT 158 161 147*   Cardiac Enzymes: No results for input(s): CKTOTAL, CKMB, CKMBINDEX, TROPONINI in the last 168 hours. BNP: Invalid input(s): POCBNP CBG: No results for input(s): GLUCAP in the last 168 hours. D-Dimer No results for input(s): DDIMER in the last 72 hours. Hgb A1c No results for input(s): HGBA1C in the last 72 hours. Lipid Profile No results for input(s): CHOL, HDL, LDLCALC, TRIG, CHOLHDL, LDLDIRECT in the last 72 hours. Thyroid function studies No results for input(s): TSH, T4TOTAL, T3FREE, THYROIDAB in the last 72 hours.  Invalid input(s): FREET3 Anemia work up No results for input(s): VITAMINB12, FOLATE, FERRITIN, TIBC,  IRON, RETICCTPCT in the last 72 hours. Urinalysis No results found for: COLORURINE, APPEARANCEUR, LABSPEC, PHURINE, GLUCOSEU, HGBUR, BILIRUBINUR, KETONESUR, PROTEINUR, UROBILINOGEN, NITRITE, LEUKOCYTESUR Sepsis Labs Invalid input(s): PROCALCITONIN,  WBC,  LACTICIDVEN Microbiology Recent Results (from the past 240 hour(s))  Resp Panel by RT-PCR (Flu A&B, Covid) Nasopharyngeal Swab     Status: None   Collection Time: 05/25/20  9:16 PM   Specimen: Nasopharyngeal Swab; Nasopharyngeal(NP) swabs in vial transport medium  Result Value Ref Range Status   SARS Coronavirus 2 by RT PCR NEGATIVE NEGATIVE Final    Comment: (NOTE) SARS-CoV-2 target nucleic acids are NOT DETECTED.  The SARS-CoV-2 RNA is generally detectable in  upper respiratory specimens during the acute phase of infection. The lowest concentration of SARS-CoV-2 viral copies this assay can detect is 138 copies/mL. A negative result does not preclude SARS-Cov-2 infection and should not be used as the sole basis for treatment or other patient management decisions. A negative result may occur with  improper specimen collection/handling, submission of specimen other than nasopharyngeal swab, presence of viral mutation(s) within the areas targeted by this assay, and inadequate number of viral copies(<138 copies/mL). A negative result must be combined with clinical observations, patient history, and epidemiological information. The expected result is Negative.  Fact Sheet for Patients:  BloggerCourse.com  Fact Sheet for Healthcare Providers:  SeriousBroker.it  This test is no t yet approved or cleared by the [place] FDA and  has been authorized for detection and/or diagnosis of SARS-CoV-2 by FDA under an Emergency Use Authorization (EUA). This EUA will remain  in effect (meaning this test can be used) for the duration of the COVID-19 declaration under Section 564(b)(1) of the  Act, 21 U.S.C.section 360bbb-3(b)(1), unless the authorization is terminated  or revoked sooner.       Influenza A by PCR NEGATIVE NEGATIVE Final   Influenza B by PCR NEGATIVE NEGATIVE Final    Comment: (NOTE) The Xpert Xpress SARS-CoV-2/FLU/RSV plus assay is intended as an aid in the diagnosis of influenza from Nasopharyngeal swab specimens and should not be used as a sole basis for treatment. Nasal washings and aspirates are unacceptable for Xpert Xpress SARS-CoV-2/FLU/RSV testing.  Fact Sheet for Patients: BloggerCourse.com  Fact Sheet for Healthcare Providers: SeriousBroker.it  This test is not yet approved or cleared by the [place] FDA and has been authorized for detection and/or diagnosis of SARS-CoV-2 by FDA under an Emergency Use Authorization (EUA). This EUA will remain in effect (meaning this test can be used) for the duration of the COVID-19 declaration under Section 564(b)(1) of the Act, 21 U.S.C. section 360bbb-3(b)(1), unless the authorization is terminated or revoked.  Performed at [facility], [address] [place], [place] [address]     Time coordinating discharge: Over 30 minutes  SIGNED:  Arnetha Courser, MD  [facility] 05/29/2020, 11:30 AM  If 7PM-7AM, please contact night-coverage [email or web address]  This record has been created using Conservation officer, historic buildings. Errors have been sought and corrected,but may not always be located. Such creation errors do not reflect on the standard of care.

## 2020-05-29 NOTE — NC FL2 (Signed)
[place] MEDICAID FL2 LEVEL OF CARE SCREENING TOOL     IDENTIFICATION  Patient Name: Melvin Campbell Birthdate: Mar 15, 1940 Sex: male Admission Date (Current Location): 05/25/2020  [place] and [place] Number:  Chiropodist and Address:  [facility], [address] [place], [place] [address]      Provider Number: [id number]  Attending Physician Name and Address:  Arnetha Courser, MD  Relative Name and Phone Number:  Tetsuo Coppola [phone]    Current Level of Care: Hospital Recommended Level of Care: Assisted Living Facility Prior Approval Number:    Date Approved/Denied:   PASRR Number:    Discharge Plan: Other (Comment) (ALF)    Current Diagnoses: Patient Active Problem List   Diagnosis Date Noted  . ACS (acute coronary syndrome) (HCC) 05/25/2020  . Acute on chronic respiratory failure with hypoxia (HCC) 03/31/2020  . Elevated troponin 03/31/2020  . Community acquired pneumonia 03/31/2020  . Bilateral lower extremity edema 03/31/2020    Orientation RESPIRATION BLADDER Height & Weight     Self  O2 (4L [place]) Continent Weight: 81.5 kg Height:  5\' 7"  (170.2 cm)  BEHAVIORAL SYMPTOMS/MOOD NEUROLOGICAL BOWEL NUTRITION STATUS      Continent Diet  AMBULATORY STATUS COMMUNICATION OF NEEDS Skin   Limited Assist (Uses Walker) Verbally Normal                       Personal Care Assistance Level of Assistance  Bathing,Feeding,Dressing Bathing Assistance: Limited assistance Feeding assistance: Independent Dressing Assistance: Independent     Functional Limitations Info  Sight,Hearing,Speech Sight Info: Adequate Hearing Info: Adequate Speech Info: Adequate    SPECIAL CARE FACTORS FREQUENCY                       Contractures Contractures Info: Not present    Additional Factors Info  Code Status,Allergies Code Status Info: FULL Allergies Info: Amlodipine           Current Medications (05/29/2020):  This is the  current hospital active medication list Current Facility-Administered Medications  Medication Dose Route Frequency Provider Last Rate Last Admin  . acetaminophen (TYLENOL) tablet 650 mg  650 mg Oral Q6H PRN Mansy, Jan A, MD   650 mg at 05/26/20 2025   Or  . acetaminophen (TYLENOL) suppository 650 mg  650 mg Rectal Q6H PRN Mansy, Jan A, MD      . allopurinol (ZYLOPRIM) tablet 300 mg  300 mg Oral Daily Mansy, Jan A, MD   300 mg at 05/29/20 0935  . apixaban (ELIQUIS) tablet 5 mg  5 mg Oral BID 05/31/20, MD   5 mg at 05/29/20 0931  . aspirin EC tablet 81 mg  81 mg Oral Daily Mansy, Jan A, MD   81 mg at 05/29/20 0930  . atorvastatin (LIPITOR) tablet 80 mg  80 mg Oral QHS Mansy, Jan A, MD   80 mg at 05/28/20 2013  . budesonide (PULMICORT) nebulizer solution 0.25 mg  0.25 mg Nebulization BID Mansy, Jan A, MD   0.25 mg at 05/29/20 0755  . donepezil (ARICEPT) tablet 5 mg  5 mg Oral Daily Mansy, Jan A, MD   5 mg at 05/29/20 0935  . erythromycin ophthalmic ointment 1 application  1 application Right Eye TID Mansy, 05/31/20, MD   1 application at 05/29/20 0941  . escitalopram (LEXAPRO) tablet 20 mg  20 mg Oral Daily Mansy, Jan A, MD   20 mg at 05/29/20  0935  . folic acid (FOLVITE) tablet 1 mg  1 mg Oral Daily Mansy, Jan A, MD   1 mg at 05/29/20 0931  . haloperidol lactate (HALDOL) injection 1-2 mg  1-2 mg Intramuscular Q6H PRN Mansy, Jan A, MD      . ipratropium-albuterol (DUONEB) 0.5-2.5 (3) MG/3ML nebulizer solution 3 mL  3 mL Nebulization Q6H Charise Killian, MD   3 mL at 05/29/20 0755  . losartan (COZAAR) tablet 100 mg  100 mg Oral Daily Mansy, Jan A, MD   100 mg at 05/29/20 0930  . magnesium hydroxide (MILK OF MAGNESIA) suspension 30 mL  30 mL Oral Daily PRN Mansy, Jan A, MD      . magnesium oxide (MAG-OX) tablet 400 mg  400 mg Oral BID Mansy, Jan A, MD   400 mg at 05/29/20 0931  . metoprolol succinate (TOPROL-XL) 24 hr tablet 75 mg  75 mg Oral Daily Mansy, Jan A, MD   75 mg at 05/29/20 4098  .  multivitamin with minerals tablet 1 tablet  1 tablet Oral Daily Mansy, Jan A, MD   1 tablet at 05/29/20 0930  . nitroGLYCERIN (NITROSTAT) SL tablet 0.4 mg  0.4 mg Sublingual Q5 Min x 3 PRN Mansy, Jan A, MD      . ondansetron [facility]) tablet 4 mg  4 mg Oral Q6H PRN Mansy, Jan A, MD       Or  . ondansetron [facility]) injection 4 mg  4 mg Intravenous Q6H PRN Mansy, Jan A, MD      . polyvinyl alcohol (LIQUIFILM TEARS) 1.4 % ophthalmic solution 1 drop  1 drop Right Eye Q6H Mansy, Jan A, MD   1 drop at 05/29/20 0534  . prednisoLONE acetate (PRED FORTE) 1 % ophthalmic suspension 1 drop  1 drop Right Eye BID Mansy, Jan A, MD   1 drop at 05/29/20 0942  . QUEtiapine (SEROQUEL) tablet 25 mg  25 mg Oral QHS Mansy, Jan A, MD   25 mg at 05/28/20 2014  . thiamine tablet 100 mg  100 mg Oral Daily Mansy, Jan A, MD   100 mg at 05/29/20 0931  . traZODone (DESYREL) tablet 25 mg  25 mg Oral QHS PRN Mansy, Vernetta Honey, MD         Discharge Medications: Please see discharge summary for a list of discharge medications.  Relevant Imaging Results:  Relevant Lab Results:   Additional Information SS# [id number]  Hetty Ely, RN

## 2020-06-07 ENCOUNTER — Emergency Department: Payer: Medicare Other

## 2020-06-07 ENCOUNTER — Inpatient Hospital Stay
Admission: EM | Admit: 2020-06-07 | Discharge: 2020-06-14 | DRG: 190 | Disposition: A | Discharge status: Home Health | Payer: Medicare Other | Source: Skilled Nursing Facility | Attending: Hospitalist | Admitting: Hospitalist

## 2020-06-07 DIAGNOSIS — I5033 Acute on chronic diastolic (congestive) heart failure: Secondary | ICD-10-CM | POA: Diagnosis present

## 2020-06-07 DIAGNOSIS — J441 Chronic obstructive pulmonary disease with (acute) exacerbation: Secondary | ICD-10-CM | POA: Diagnosis not present

## 2020-06-07 DIAGNOSIS — F0391 Unspecified dementia with behavioral disturbance: Secondary | ICD-10-CM | POA: Diagnosis present

## 2020-06-07 DIAGNOSIS — I10 Essential (primary) hypertension: Secondary | ICD-10-CM

## 2020-06-07 DIAGNOSIS — Z79899 Other long term (current) drug therapy: Secondary | ICD-10-CM

## 2020-06-07 DIAGNOSIS — Z20822 Contact with and (suspected) exposure to covid-19: Secondary | ICD-10-CM | POA: Diagnosis present

## 2020-06-07 DIAGNOSIS — Z86711 Personal history of pulmonary embolism: Secondary | ICD-10-CM

## 2020-06-07 DIAGNOSIS — M109 Gout, unspecified: Secondary | ICD-10-CM | POA: Diagnosis present

## 2020-06-07 DIAGNOSIS — J9622 Acute and chronic respiratory failure with hypercapnia: Secondary | ICD-10-CM | POA: Diagnosis present

## 2020-06-07 DIAGNOSIS — Z66 Do not resuscitate: Secondary | ICD-10-CM

## 2020-06-07 DIAGNOSIS — Z7901 Long term (current) use of anticoagulants: Secondary | ICD-10-CM

## 2020-06-07 DIAGNOSIS — I110 Hypertensive heart disease with heart failure: Secondary | ICD-10-CM | POA: Diagnosis present

## 2020-06-07 DIAGNOSIS — Z888 Allergy status to other drugs, medicaments and biological substances status: Secondary | ICD-10-CM

## 2020-06-07 DIAGNOSIS — F32A Depression, unspecified: Secondary | ICD-10-CM | POA: Diagnosis present

## 2020-06-07 DIAGNOSIS — N400 Enlarged prostate without lower urinary tract symptoms: Secondary | ICD-10-CM | POA: Diagnosis present

## 2020-06-07 DIAGNOSIS — R001 Bradycardia, unspecified: Secondary | ICD-10-CM | POA: Diagnosis present

## 2020-06-07 DIAGNOSIS — Z7952 Long term (current) use of systemic steroids: Secondary | ICD-10-CM

## 2020-06-07 DIAGNOSIS — Z7982 Long term (current) use of aspirin: Secondary | ICD-10-CM

## 2020-06-07 DIAGNOSIS — J9621 Acute and chronic respiratory failure with hypoxia: Secondary | ICD-10-CM | POA: Diagnosis not present

## 2020-06-07 DIAGNOSIS — Z9981 Dependence on supplemental oxygen: Secondary | ICD-10-CM

## 2020-06-07 DIAGNOSIS — E873 Alkalosis: Secondary | ICD-10-CM | POA: Diagnosis present

## 2020-06-07 DIAGNOSIS — F03918 Unspecified dementia, unspecified severity, with other behavioral disturbance: Secondary | ICD-10-CM

## 2020-06-07 DIAGNOSIS — G9349 Other encephalopathy: Secondary | ICD-10-CM | POA: Diagnosis present

## 2020-06-07 LAB — CBC WITH DIFFERENTIAL/PLATELET
Abs Immature Granulocytes: 0.05 10*3/uL (ref 0.00–0.07)
Basophils Absolute: 0.1 10*3/uL (ref 0.0–0.1)
Basophils Relative: 0 %
Eosinophils Absolute: 0.2 10*3/uL (ref 0.0–0.5)
Eosinophils Relative: 1 %
HCT: 50.8 % (ref 39.0–52.0)
Hemoglobin: 14.2 g/dL (ref 13.0–17.0)
Immature Granulocytes: 0 %
Lymphocytes Relative: 22 %
Lymphs Abs: 2.9 10*3/uL (ref 0.7–4.0)
MCH: 24.8 pg — ABNORMAL LOW (ref 26.0–34.0)
MCHC: 28 g/dL — ABNORMAL LOW (ref 30.0–36.0)
MCV: 88.8 fL (ref 80.0–100.0)
Monocytes Absolute: 1 10*3/uL (ref 0.1–1.0)
Monocytes Relative: 8 %
Neutro Abs: 9.2 10*3/uL — ABNORMAL HIGH (ref 1.7–7.7)
Neutrophils Relative %: 69 %
Platelets: 182 10*3/uL (ref 150–400)
RBC: 5.72 MIL/uL (ref 4.22–5.81)
RDW: 16.7 % — ABNORMAL HIGH (ref 11.5–15.5)
WBC: 13.4 10*3/uL — ABNORMAL HIGH (ref 4.0–10.5)
nRBC: 0 % (ref 0.0–0.2)

## 2020-06-07 LAB — COMPREHENSIVE METABOLIC PANEL
ALT: 58 U/L — ABNORMAL HIGH (ref 0–44)
AST: 24 U/L (ref 15–41)
Albumin: 3.6 g/dL (ref 3.5–5.0)
Alkaline Phosphatase: 83 U/L (ref 38–126)
Anion gap: 8 (ref 5–15)
BUN: 19 mg/dL (ref 8–23)
CO2: 39 mmol/L — ABNORMAL HIGH (ref 22–32)
Calcium: 9.2 mg/dL (ref 8.9–10.3)
Chloride: 95 mmol/L — ABNORMAL LOW (ref 98–111)
Creatinine, Ser: 0.61 mg/dL (ref 0.61–1.24)
GFR, Estimated: >60 mL/min (ref >60)
Glucose, Bld: 122 mg/dL — ABNORMAL HIGH (ref 70–99)
Potassium: 4.1 mmol/L (ref 3.5–5.1)
Sodium: 142 mmol/L (ref 135–145)
Total Bilirubin: 1.2 mg/dL (ref 0.3–1.2)
Total Protein: 6.5 g/dL (ref 6.5–8.1)

## 2020-06-07 LAB — RESP PANEL BY RT-PCR (FLU A&B, COVID) ARPGX2
Influenza A by PCR: NEGATIVE
Influenza B by PCR: NEGATIVE
SARS Coronavirus 2 by RT PCR: NEGATIVE

## 2020-06-07 LAB — BLOOD GAS, VENOUS
Acid-Base Excess: 17 mmol/L — ABNORMAL HIGH (ref 0.0–2.0)
Bicarbonate: 48.8 mmol/L — ABNORMAL HIGH (ref 20.0–28.0)
FIO2: 40
Mode: POSITIVE
O2 Saturation: 84.5 %
Patient temperature: 37
pCO2, Ven: 97 mmHg (ref 44.0–60.0)
pH, Ven: 7.31 (ref 7.250–7.430)
pO2, Ven: 54 mmHg — ABNORMAL HIGH (ref 32.0–45.0)

## 2020-06-07 LAB — MAGNESIUM: Magnesium: 2.1 mg/dL (ref 1.7–2.4)

## 2020-06-07 LAB — TROPONIN I (HIGH SENSITIVITY): Troponin I (High Sensitivity): 15 ng/L (ref <18)

## 2020-06-07 MED ORDER — IPRATROPIUM-ALBUTEROL 0.5-2.5 (3) MG/3ML IN SOLN
3.0000 mL | Freq: Once | RESPIRATORY_TRACT | Status: AC
  Administered 2020-06-07: 3 mL via RESPIRATORY_TRACT
  Filled 2020-06-07: qty 3

## 2020-06-07 MED ORDER — NITROGLYCERIN 2 % TD OINT
1.0000 [in_us] | TOPICAL_OINTMENT | Freq: Once | TRANSDERMAL | Status: AC
  Administered 2020-06-07: 1 [in_us] via TOPICAL
  Filled 2020-06-07: qty 1

## 2020-06-07 NOTE — ED Provider Notes (Signed)
[facility] Emergency Department Provider Note  ____________________________________________   Event Date/Time   First MD Initiated Contact with Patient 06/07/20 2255     (approximate)  I have reviewed the triage vital signs and the nursing notes.   HISTORY  Chief Complaint Shortness of Breath (Pt arrives to ED from [facility]. Pt c/o sob since noon today. Pt was 88% on 3L, had breathing tx at noon & 10pm without improvement. Pt reported to have decrease in activity today r/t sob.)  Level 5 caveat:  history/ROS limited by acute/critical illness and reported chronic dementia   HPI Melvin Campbell is a 80 y.o. male who is chronically ill with chronic respiratory failure secondary to COPD on 3 L of oxygen at baseline, as well as a reported history of at least mild dementia.  He presents from his nursing facility by EMS for gradually worsening shortness of breath over the last 11 hours that has become severe.  He was given a nebulizer treatment at around noon today and his breathing continued to worsen over the course of the day.  He was given another breathing treatment tonight and it did not help.  He was in severe respiratory distress in spite of his chronic 3 L and satting in the low 80s.  When EMS arrived they put him on a nonrebreather which did not increase his SPO2 above the upper 80s.  They placed him on CPAP which brought him up to about 91% upon arrival to the emergency department.  They also provided Solu-Medrol 125 mg IV.  When they noted his systolic blood pressure greater than 200 they also placed an inch of nitroglycerin paste on his chest but it fell off prior to his arrival to the emergency department.  The patient reports that he feels a little bit better but still feels short of breath.  He denies any pain including chest pain and abdominal pain.  He denies a fever.  He has had no nausea nor vomiting.         No past medical history  on file.  Patient Active Problem List   Diagnosis Date Noted  . Acute on chronic respiratory failure with hypoxia and hypercapnia (HCC) 06/08/2020  . Bradycardia 06/08/2020  . History of pulmonary embolism 06/08/2020  . HTN (hypertension) 06/08/2020  . Depression 06/08/2020  . Dementia with behavioral disturbance (HCC) 06/08/2020  . Gout 06/08/2020  . DNR (do not resuscitate) 06/08/2020  . Shortness of breath   . Hypoxia   . ACS (acute coronary syndrome) (HCC) 05/25/2020  . Acute on chronic respiratory failure with hypoxia (HCC) 03/31/2020  . Elevated troponin 03/31/2020  . Community acquired pneumonia 03/31/2020  . Bilateral lower extremity edema 03/31/2020    No past surgical history on file.  Prior to Admission medications   Medication Sig Start Date End Date Taking? Authorizing Provider  acetaminophen (TYLENOL) 325 MG tablet Take 650 mg by mouth every 4 (four) hours as needed. 12/31/19  Yes [provider]  albuterol (VENTOLIN HFA) 108 (90 Base) MCG/ACT inhaler Inhale 2 puffs into the lungs every 4 (four) hours as needed. 01/02/20  Yes [provider]  allopurinol (ZYLOPRIM) 300 MG tablet Take 300 mg by mouth daily. 03/23/20  Yes [provider]  ARTIFICIAL TEARS 1.4 % ophthalmic solution Place 1 drop into the right eye every 6 (six) hours. 03/08/20  Yes [provider]  aspirin 81 MG EC tablet Take 1 tablet (81 mg total) by  mouth daily. Swallow whole. 04/13/20  Yes Cipriano BunkerKumar, Pardeep, MD  atorvastatin (LIPITOR) 80 MG tablet Take 1 tablet (80 mg total) by mouth daily. 04/13/20  Yes Cipriano BunkerKumar, Pardeep, MD  budesonide (PULMICORT) 0.25 MG/2ML nebulizer solution Take 2 mLs (0.25 mg total) by nebulization 2 (two) times daily. 04/12/20  Yes Cipriano BunkerKumar, Pardeep, MD  donepezil (ARICEPT) 5 MG tablet Take 5 mg by mouth daily. 05/19/20  Yes [provider]  ELIQUIS 5 MG TABS tablet Take 5 mg by mouth 2 (two) times daily. 03/23/20  Yes [provider]   escitalopram (LEXAPRO) 20 MG tablet Take 20 mg by mouth daily. 03/23/20  Yes [provider]  folic acid (FOLVITE) 1 MG tablet Take 1 mg by mouth daily. 03/23/20  Yes [provider]  furosemide (LASIX) 20 MG tablet Take 20 mg by mouth daily as needed. 11/28/19  Yes [provider]  losartan (COZAAR) 100 MG tablet Take 100 mg by mouth daily. 03/23/20  Yes [provider]  magnesium oxide (MAG-OX) 400 (241.3 Mg) MG tablet Take 1 tablet by mouth 2 (two) times daily. 03/23/20  Yes [provider]  metoprolol succinate (TOPROL-XL) 25 MG 24 hr tablet Take 3 tablets (75 mg total) by mouth daily. Take with or immediately following a meal. 05/30/20  Yes Arnetha CourserAmin, Sumayya, MD  Multiple Vitamins-Minerals (MULTI-DAY PLUS MINERALS) TABS Take 1 tablet by mouth daily. 03/23/20  Yes [provider]  nitroGLYCERIN (NITROSTAT) 0.4 MG SL tablet Place 1 tablet (0.4 mg total) under the tongue every 5 (five) minutes x 3 doses as needed for chest pain. 04/12/20  Yes Cipriano BunkerKumar, Pardeep, MD  prednisoLONE acetate (PRED FORTE) 1 % ophthalmic suspension Place 1 drop into the right eye 2 (two) times daily. 02/28/20  Yes [provider]  QUEtiapine (SEROQUEL) 25 MG tablet Take 25 mg by mouth at bedtime. 05/19/20  Yes [provider]  Thiamine HCl (B-1) 100 MG TABS Take 1 tablet by mouth daily. 03/23/20  Yes [provider]  predniSONE (DELTASONE) 50 MG tablet Take 1 tablet daily for 5 days. Patient not taking: No sig reported 05/29/20   Arnetha CourserAmin, Sumayya, MD    Allergies Amlodipine  No family history on file.  Social History Social History   Tobacco Use  . Smoking status: Never Smoker  . Smokeless tobacco: Never Used  Vaping Use  . Vaping Use: Never used  Substance Use Topics  . Alcohol use: Not Currently  . Drug use: Not Currently    Review of Systems Level 5 caveat:  history/ROS limited by acute/critical illness and reported chronic  dementia   ____________________________________________   PHYSICAL EXAM:  ED Triage Vitals  Enc Vitals Group     BP 06/07/20 2316 140/65     Pulse Rate 06/07/20 2309 (!) 53     Resp 06/07/20 2309 (!) 23     Temp 06/08/20 0230 97.6 F (36.4 C)     Temp Source 06/08/20 0230 Axillary     SpO2 06/07/20 2309 93 %     Weight 06/07/20 2334 81.5 kg (179 lb 10.8 oz)     Height 06/07/20 2334 1.702 m (5\' 7" )     Head Circumference --      Peak Flow --      Pain Score --      Pain Loc --      Pain Edu? --      Excl. in GC? --      Constitutional: Alert and oriented to self and  location. Eyes: Conjunctivae are normal.  Head: Atraumatic. Nose: No congestion/rhinnorhea. Mouth/Throat: Patient is wearing a mask. Neck: No stridor.  No meningeal signs.   Cardiovascular: Normal rate, regular rhythm. Good peripheral circulation. Respiratory: Increased respiratory effort with intercostal retractions and accessory muscle usage.  No wheezing, decreased air movement throughout.  No coarse breath sounds. Gastrointestinal: Soft and nontender. No distention.  Musculoskeletal: 1+ pitting edema in bilateral lower extremities. Neurologic:  Normal speech and language. No gross focal neurologic deficits are appreciated.  Skin:  Skin is warm, dry and intact. Psychiatric: Mood and affect are normal for the circumstances.  ____________________________________________   LABS (all labs ordered are listed, but only abnormal results are displayed)  Labs Reviewed  BRAIN NATRIURETIC PEPTIDE - Abnormal; Notable for the following components:      Result Value   B Natriuretic Peptide 429.8 (*)    All other components within normal limits  CBC WITH DIFFERENTIAL/PLATELET - Abnormal; Notable for the following components:   WBC 13.4 (*)    MCH 24.8 (*)    MCHC 28.0 (*)    RDW 16.7 (*)    Neutro Abs 9.2 (*)    All other components within normal limits  COMPREHENSIVE METABOLIC PANEL - Abnormal; Notable for  the following components:   Chloride 95 (*)    CO2 39 (*)    Glucose, Bld 122 (*)    ALT 58 (*)    All other components within normal limits  BLOOD GAS, VENOUS - Abnormal; Notable for the following components:   pCO2, Ven 97 (*)    pO2, Ven 54.0 (*)    Bicarbonate 48.8 (*)    Acid-Base Excess 17.0 (*)    All other components within normal limits  RESP PANEL BY RT-PCR (FLU A&B, COVID) ARPGX2  PROCALCITONIN  MAGNESIUM  BASIC METABOLIC PANEL  CBC  BLOOD GAS, VENOUS  TROPONIN I (HIGH SENSITIVITY)  TROPONIN I (HIGH SENSITIVITY)   ____________________________________________  EKG  ED ECG REPORT I, Loleta Rose, the attending physician, personally viewed and interpreted this ECG.  Date: 06/07/2020 EKG Time: 23: 02 Rate: 61 Rhythm: Sinus rhythm with first-degree AV block QRS Axis: normal Intervals: Left bundle branch block, PR interval 220 ms ST/T Wave abnormalities: Non-specific ST segment / T-wave changes, but no clear evidence of acute ischemia. Narrative Interpretation: no definitive evidence of acute ischemia; does not meet STEMI criteria.   ____________________________________________  RADIOLOGY I, Loleta Rose, personally viewed and evaluated these images (plain radiographs) as part of my medical decision making, as well as reviewing the written report by the radiologist.  ED MD interpretation: Vascular congestion and probable pulmonary edema (groundglass opacities).  Small left-sided pleural effusion  Official radiology report(s): DG Chest Portable 1 View  Result Date: 06/07/2020 CLINICAL DATA:  Dyspnea EXAM: PORTABLE CHEST 1 VIEW COMPARISON:  05/25/2020, CT 05/25/2020 FINDINGS: Stable cardiomediastinal silhouette. Vascular congestion and mild diffuse interstitial and ground-glass opacity likely pulmonary edema. Small left-sided pleural effusion. No pneumothorax. Aortic atherosclerosis. IMPRESSION: Continued vascular congestion and mild diffuse interstitial and  ground-glass opacity likely pulmonary edema. Small left-sided pleural effusion. Electronically Signed   By: Jasmine Pang M.D.   On: 06/07/2020 23:15    ____________________________________________   PROCEDURES   Procedure(s) performed (including Critical Care):  .Critical Care Performed by: Loleta Rose, MD Authorized by: Loleta Rose, MD   Critical care provider statement:    Critical care time (minutes):  45   Critical care time was exclusive of:  Separately billable procedures and treating other patients  Critical care was necessary to treat or prevent imminent or life-threatening deterioration of the following conditions:  Respiratory failure   Critical care was time spent personally by me on the following activities:  Development of treatment plan with patient or surrogate, discussions with consultants, evaluation of patient's response to treatment, examination of patient, obtaining history from patient or surrogate, ordering and performing treatments and interventions, ordering and review of laboratory studies, ordering and review of radiographic studies, pulse oximetry, re-evaluation of patient's condition and review of old charts .1-3 Lead EKG Interpretation Performed by: Loleta Rose, MD Authorized by: Loleta Rose, MD     Interpretation: normal     ECG rate:  65   ECG rate assessment: normal     Rhythm: sinus rhythm     Ectopy: none     Conduction: normal       ____________________________________________   INITIAL IMPRESSION / MDM / ASSESSMENT AND PLAN / ED COURSE  As part of my medical decision making, I reviewed the following data within the electronic MEDICAL RECORD NUMBER Nursing notes reviewed and incorporated, Labs reviewed , EKG interpreted , Old chart reviewed, Radiograph reviewed , Discussed with admitting physician  and reviewed Notes from prior ED visits   Differential diagnosis includes, but is not limited to, COPD exacerbation, new onset heart  failure, pneumonia, COVID-19, PE.  The patient is on the cardiac monitor to evaluate for evidence of arrhythmia and/or significant heart rate changes.  Patient is still in substantial distress although improved from when EMS picked him up.  He was initially hypertensive at around 190 systolic and I ordered nitroglycerin 1 inch paste to his chest.  However I reviewed his medical record and his echocardiogram for 2 months ago showing a preserved ejection fraction.  However during his last hospital admission which was only about 2 weeks ago, he seemed to be having an NSTEMI with a troponin greater than 1200 and he was deemed to not be a candidate for aggressive cardiac intervention.  He may have developed heart failure with decreased ejection fraction since his last echocardiogram.  The most likely cause of his respiratory failure tonight is worsening COPD.  I ordered 3 duo nebs in line with the BiPAP which was started immediately.  Lab work pending including VBG.  Chest x-ray obtained immediately after arrival which I personally reviewed, as well as reviewing the radiologist report, shows vascular congestion, pulmonary edema, and small pleural effusion.  Doubt acute infectious process at this time, will hold off on empiric antibiotics.  Patient received Solu-Medrol 125 mg IV by EMS prior to arrival.     Clinical Course as of 06/08/20 0403  Fri Jun 07, 2020  2322 pCO2, Ven(!!): 97 Substantial hypercapnea, continuing Bipap [CF]  2323 WBC(!): 13.4 Leukocytosis but the patient has been on steroids recently, so unclear clinical significance [CF]  Sat Jun 08, 2020  0015 Remainder of his labs are generally reassuring, normal comprehensive metabolic panel, normal troponin, normal magnesium, negative COVID-19, normal procalcitonin.  Patient feeling better on BiPAP.  Consulting hospitalist for admission. [CF]  0030 Patient is agitated and trying to remove mask.  Tried to verbally redirect him, explain why he  needs Bipap.  Suspect hypercapnea and hypoxemia are leading to increased confusion.  Will provide droperidol 2.5 mg IV as a calming agent (trying to avoid benzos in this elderly patient).  Calling patient's daughter as per his request. [CF]  80 Spoke with Tobi Bastos, the patient's daughter and legal guardian.  She confirmed his  DNR status.  She understands the concerns of his agitation, confirmed his dementia diagnosis, agreed with plans for calming agents.  Understands severity of his symptoms and the importance of bipap.  She is talking to him by phone to try and redirect him while we administer droperidol and replace the bipap [CF]  0041 Discussed case with Dr. Cyndia Bent who will admit. [CF]    Clinical Course User Index [CF] Loleta Rose, MD     ____________________________________________  FINAL CLINICAL IMPRESSION(S) / ED DIAGNOSES  Final diagnoses:  Acute on chronic respiratory failure with hypoxia and hypercapnia (HCC)  DNR (do not resuscitate)     MEDICATIONS GIVEN DURING THIS VISIT:  Medications  ipratropium-albuterol (DUONEB) 0.5-2.5 (3) MG/3ML nebulizer solution 3 mL (3 mLs Nebulization Given 06/08/20 0148)  methylPREDNISolone sodium succinate (SOLU-MEDROL) 40 mg/mL injection 40 mg (has no administration in time range)  furosemide (LASIX) injection 40 mg (has no administration in time range)  allopurinol (ZYLOPRIM) tablet 300 mg (has no administration in time range)  aspirin EC tablet 81 mg (has no administration in time range)  atorvastatin (LIPITOR) tablet 80 mg (has no administration in time range)  losartan (COZAAR) tablet 100 mg (has no administration in time range)  metoprolol succinate (TOPROL-XL) 24 hr tablet 50 mg (has no administration in time range)  donepezil (ARICEPT) tablet 5 mg (has no administration in time range)  escitalopram (LEXAPRO) tablet 20 mg (has no administration in time range)  QUEtiapine (SEROQUEL) tablet 25 mg (has no administration in time range)   apixaban (ELIQUIS) tablet 5 mg (has no administration in time range)  folic acid (FOLVITE) tablet 1 mg (has no administration in time range)  magnesium oxide (MAG-OX) tablet 400 mg (has no administration in time range)  thiamine tablet 100 mg (has no administration in time range)  budesonide (PULMICORT) nebulizer solution 0.25 mg (has no administration in time range)  ipratropium-albuterol (DUONEB) 0.5-2.5 (3) MG/3ML nebulizer solution 3 mL (3 mLs Nebulization Given 06/07/20 2310)  ipratropium-albuterol (DUONEB) 0.5-2.5 (3) MG/3ML nebulizer solution 3 mL (3 mLs Nebulization Given 06/07/20 2310)  ipratropium-albuterol (DUONEB) 0.5-2.5 (3) MG/3ML nebulizer solution 3 mL (3 mLs Nebulization Given 06/07/20 2310)  nitroGLYCERIN (NITROGLYN) 2 % ointment 1 inch (1 inch Topical Given 06/07/20 2312)  droperidol (INAPSINE) 2.5 MG/ML injection 2.5 mg (2.5 mg Intravenous Given 06/08/20 0046)     ED Discharge Orders    None      *Please note:  Melvin Campbell was evaluated in Emergency Department on 06/08/2020 for the symptoms described in the history of present illness. He was evaluated in the context of the global COVID-19 pandemic, which necessitated consideration that the patient might be at risk for infection with the SARS-CoV-2 virus that causes COVID-19. Institutional protocols and algorithms that pertain to the evaluation of patients at risk for COVID-19 are in a state of rapid change based on information released by regulatory bodies including the CDC and federal and state organizations. These policies and algorithms were followed during the patient's care in the ED.  Some ED evaluations and interventions may be delayed as a result of limited staffing during and after the pandemic.*  Note:  This document was prepared using Dragon voice recognition software and may include unintentional dictation errors.   Loleta Rose, MD 06/08/20 [phone]

## 2020-06-08 ENCOUNTER — Inpatient Hospital Stay (HOSPITAL_COMMUNITY)
Admit: 2020-06-08 | Discharge: 2020-06-08 | Disposition: A | Discharge status: Home / Self Care | Payer: Medicare Other | Attending: Family Medicine | Admitting: Family Medicine

## 2020-06-08 ENCOUNTER — Other Ambulatory Visit: Payer: Self-pay

## 2020-06-08 DIAGNOSIS — M1A9XX0 Chronic gout, unspecified, without tophus (tophi): Secondary | ICD-10-CM

## 2020-06-08 DIAGNOSIS — F0391 Unspecified dementia with behavioral disturbance: Secondary | ICD-10-CM | POA: Diagnosis present

## 2020-06-08 DIAGNOSIS — Z66 Do not resuscitate: Secondary | ICD-10-CM | POA: Diagnosis present

## 2020-06-08 DIAGNOSIS — Z9981 Dependence on supplemental oxygen: Secondary | ICD-10-CM | POA: Diagnosis not present

## 2020-06-08 DIAGNOSIS — Z888 Allergy status to other drugs, medicaments and biological substances status: Secondary | ICD-10-CM | POA: Diagnosis not present

## 2020-06-08 DIAGNOSIS — R001 Bradycardia, unspecified: Secondary | ICD-10-CM | POA: Diagnosis present

## 2020-06-08 DIAGNOSIS — Z7901 Long term (current) use of anticoagulants: Secondary | ICD-10-CM | POA: Diagnosis not present

## 2020-06-08 DIAGNOSIS — Z7982 Long term (current) use of aspirin: Secondary | ICD-10-CM | POA: Diagnosis not present

## 2020-06-08 DIAGNOSIS — J9622 Acute and chronic respiratory failure with hypercapnia: Secondary | ICD-10-CM

## 2020-06-08 DIAGNOSIS — G9349 Other encephalopathy: Secondary | ICD-10-CM | POA: Diagnosis present

## 2020-06-08 DIAGNOSIS — I10 Essential (primary) hypertension: Secondary | ICD-10-CM

## 2020-06-08 DIAGNOSIS — J441 Chronic obstructive pulmonary disease with (acute) exacerbation: Secondary | ICD-10-CM | POA: Diagnosis present

## 2020-06-08 DIAGNOSIS — M109 Gout, unspecified: Secondary | ICD-10-CM | POA: Diagnosis present

## 2020-06-08 DIAGNOSIS — I5033 Acute on chronic diastolic (congestive) heart failure: Secondary | ICD-10-CM | POA: Diagnosis present

## 2020-06-08 DIAGNOSIS — Z79899 Other long term (current) drug therapy: Secondary | ICD-10-CM | POA: Diagnosis not present

## 2020-06-08 DIAGNOSIS — Z86711 Personal history of pulmonary embolism: Secondary | ICD-10-CM

## 2020-06-08 DIAGNOSIS — F32A Depression, unspecified: Secondary | ICD-10-CM

## 2020-06-08 DIAGNOSIS — N400 Enlarged prostate without lower urinary tract symptoms: Secondary | ICD-10-CM | POA: Diagnosis present

## 2020-06-08 DIAGNOSIS — F0281 Dementia in other diseases classified elsewhere with behavioral disturbance: Secondary | ICD-10-CM

## 2020-06-08 DIAGNOSIS — Z7952 Long term (current) use of systemic steroids: Secondary | ICD-10-CM | POA: Diagnosis not present

## 2020-06-08 DIAGNOSIS — I110 Hypertensive heart disease with heart failure: Secondary | ICD-10-CM | POA: Diagnosis present

## 2020-06-08 DIAGNOSIS — E873 Alkalosis: Secondary | ICD-10-CM | POA: Diagnosis present

## 2020-06-08 DIAGNOSIS — J9621 Acute and chronic respiratory failure with hypoxia: Secondary | ICD-10-CM | POA: Diagnosis present

## 2020-06-08 DIAGNOSIS — G309 Alzheimer's disease, unspecified: Secondary | ICD-10-CM | POA: Diagnosis not present

## 2020-06-08 DIAGNOSIS — Z20822 Contact with and (suspected) exposure to covid-19: Secondary | ICD-10-CM | POA: Diagnosis present

## 2020-06-08 DIAGNOSIS — R778 Other specified abnormalities of plasma proteins: Secondary | ICD-10-CM

## 2020-06-08 DIAGNOSIS — F03918 Unspecified dementia, unspecified severity, with other behavioral disturbance: Secondary | ICD-10-CM

## 2020-06-08 LAB — ECHOCARDIOGRAM COMPLETE
Area-P 1/2: 5.06 cm2
Height: 67 in
S' Lateral: 4.04 cm
Weight: 2973.56 oz

## 2020-06-08 LAB — TROPONIN I (HIGH SENSITIVITY)
Troponin I (High Sensitivity): 14 ng/L (ref <18)
Troponin I (High Sensitivity): 11 ng/L (ref <18)

## 2020-06-08 LAB — BASIC METABOLIC PANEL
Anion gap: 9 (ref 5–15)
BUN: 20 mg/dL (ref 8–23)
CO2: 38 mmol/L — ABNORMAL HIGH (ref 22–32)
Calcium: 9.1 mg/dL (ref 8.9–10.3)
Chloride: 94 mmol/L — ABNORMAL LOW (ref 98–111)
Creatinine, Ser: 0.75 mg/dL (ref 0.61–1.24)
GFR, Estimated: >60 mL/min (ref >60)
Glucose, Bld: 164 mg/dL — ABNORMAL HIGH (ref 70–99)
Potassium: 4.7 mmol/L (ref 3.5–5.1)
Sodium: 141 mmol/L (ref 135–145)

## 2020-06-08 LAB — CBC
HCT: 49 % (ref 39.0–52.0)
Hemoglobin: 13.8 g/dL (ref 13.0–17.0)
MCH: 24.8 pg — ABNORMAL LOW (ref 26.0–34.0)
MCHC: 28.2 g/dL — ABNORMAL LOW (ref 30.0–36.0)
MCV: 88.1 fL (ref 80.0–100.0)
Platelets: 155 10*3/uL (ref 150–400)
RBC: 5.56 MIL/uL (ref 4.22–5.81)
RDW: 16.8 % — ABNORMAL HIGH (ref 11.5–15.5)
WBC: 10.3 10*3/uL (ref 4.0–10.5)
nRBC: 0 % (ref 0.0–0.2)

## 2020-06-08 LAB — PROCALCITONIN: Procalcitonin: <0.1 ng/mL

## 2020-06-08 LAB — BRAIN NATRIURETIC PEPTIDE: B Natriuretic Peptide: 429.8 pg/mL — ABNORMAL HIGH (ref 0.0–100.0)

## 2020-06-08 MED ORDER — METHYLPREDNISOLONE SODIUM SUCC 40 MG IJ SOLR
40.0000 mg | Freq: Four times a day (QID) | INTRAMUSCULAR | Status: DC
  Administered 2020-06-08 – 2020-06-09 (×3): 40 mg via INTRAVENOUS
  Filled 2020-06-08 (×3): qty 1

## 2020-06-08 MED ORDER — PERFLUTREN LIPID MICROSPHERE
1.0000 mL | INTRAVENOUS | Status: AC | PRN
  Administered 2020-06-08: 3 mL via INTRAVENOUS
  Filled 2020-06-08: qty 10

## 2020-06-08 MED ORDER — IPRATROPIUM-ALBUTEROL 0.5-2.5 (3) MG/3ML IN SOLN
3.0000 mL | RESPIRATORY_TRACT | Status: DC
  Administered 2020-06-08 – 2020-06-10 (×14): 3 mL via RESPIRATORY_TRACT
  Filled 2020-06-08 (×14): qty 3

## 2020-06-08 MED ORDER — ASPIRIN EC 81 MG PO TBEC
81.0000 mg | DELAYED_RELEASE_TABLET | Freq: Every day | ORAL | Status: DC
  Administered 2020-06-08 – 2020-06-14 (×7): 81 mg via ORAL
  Filled 2020-06-08 (×7): qty 1

## 2020-06-08 MED ORDER — ESCITALOPRAM OXALATE 20 MG PO TABS
20.0000 mg | ORAL_TABLET | Freq: Every day | ORAL | Status: DC
  Administered 2020-06-08 – 2020-06-14 (×7): 20 mg via ORAL
  Filled 2020-06-08 (×8): qty 1

## 2020-06-08 MED ORDER — THIAMINE HCL 100 MG PO TABS
100.0000 mg | ORAL_TABLET | Freq: Every day | ORAL | Status: DC
  Administered 2020-06-08 – 2020-06-14 (×7): 100 mg via ORAL
  Filled 2020-06-08 (×7): qty 1

## 2020-06-08 MED ORDER — DROPERIDOL 2.5 MG/ML IJ SOLN
2.5000 mg | Freq: Once | INTRAMUSCULAR | Status: AC
  Administered 2020-06-08: 2.5 mg via INTRAVENOUS
  Filled 2020-06-08: qty 2

## 2020-06-08 MED ORDER — FUROSEMIDE 10 MG/ML IJ SOLN
40.0000 mg | Freq: Every day | INTRAMUSCULAR | Status: DC
  Administered 2020-06-08 – 2020-06-10 (×3): 40 mg via INTRAVENOUS
  Filled 2020-06-08 (×3): qty 4

## 2020-06-08 MED ORDER — METOPROLOL SUCCINATE ER 50 MG PO TB24
50.0000 mg | ORAL_TABLET | Freq: Every day | ORAL | Status: DC
  Administered 2020-06-08 – 2020-06-14 (×6): 50 mg via ORAL
  Filled 2020-06-08 (×7): qty 1

## 2020-06-08 MED ORDER — METHYLPREDNISOLONE SODIUM SUCC 40 MG IJ SOLR
40.0000 mg | Freq: Three times a day (TID) | INTRAMUSCULAR | Status: DC
  Administered 2020-06-08 (×2): 40 mg via INTRAVENOUS
  Filled 2020-06-08 (×2): qty 1

## 2020-06-08 MED ORDER — ALLOPURINOL 100 MG PO TABS
300.0000 mg | ORAL_TABLET | Freq: Every day | ORAL | Status: DC
  Administered 2020-06-08 – 2020-06-14 (×7): 300 mg via ORAL
  Filled 2020-06-08 (×3): qty 3
  Filled 2020-06-08 (×3): qty 1
  Filled 2020-06-08: qty 3
  Filled 2020-06-08: qty 1

## 2020-06-08 MED ORDER — BUDESONIDE 0.25 MG/2ML IN SUSP: 0.2500 mg | Freq: Two times a day (BID) | RESPIRATORY_TRACT | Status: DC

## 2020-06-08 MED ORDER — FUROSEMIDE 10 MG/ML IJ SOLN
40.0000 mg | Freq: Once | INTRAMUSCULAR | Status: AC
  Administered 2020-06-08: 40 mg via INTRAVENOUS
  Filled 2020-06-08: qty 4

## 2020-06-08 MED ORDER — METHYLPREDNISOLONE SODIUM SUCC 40 MG IJ SOLR: 40.0000 mg | Freq: Every day | INTRAMUSCULAR | Status: DC

## 2020-06-08 MED ORDER — MAGNESIUM OXIDE -MG SUPPLEMENT 400 (240 MG) MG PO TABS
400.0000 mg | ORAL_TABLET | Freq: Two times a day (BID) | ORAL | Status: DC
  Administered 2020-06-08 – 2020-06-14 (×12): 400 mg via ORAL
  Filled 2020-06-08 (×13): qty 1

## 2020-06-08 MED ORDER — CHLORHEXIDINE GLUCONATE CLOTH 2 % EX PADS
6.0000 | MEDICATED_PAD | Freq: Every day | CUTANEOUS | Status: DC
  Administered 2020-06-08 – 2020-06-14 (×7): 6 via TOPICAL

## 2020-06-08 MED ORDER — LOSARTAN POTASSIUM 50 MG PO TABS
100.0000 mg | ORAL_TABLET | Freq: Every day | ORAL | Status: DC
  Administered 2020-06-08 – 2020-06-14 (×7): 100 mg via ORAL
  Filled 2020-06-08 (×7): qty 2

## 2020-06-08 MED ORDER — BUDESONIDE 0.25 MG/2ML IN SUSP
0.2500 mg | Freq: Two times a day (BID) | RESPIRATORY_TRACT | Status: DC
  Administered 2020-06-08 – 2020-06-14 (×13): 0.25 mg via RESPIRATORY_TRACT
  Filled 2020-06-08 (×12): qty 2

## 2020-06-08 MED ORDER — BUDESONIDE 0.25 MG/2ML IN SUSP
0.2500 mg | Freq: Two times a day (BID) | RESPIRATORY_TRACT | Status: DC
  Filled 2020-06-08: qty 2

## 2020-06-08 MED ORDER — FOLIC ACID 1 MG PO TABS
1.0000 mg | ORAL_TABLET | Freq: Every day | ORAL | Status: DC
  Administered 2020-06-08 – 2020-06-14 (×7): 1 mg via ORAL
  Filled 2020-06-08 (×7): qty 1

## 2020-06-08 MED ORDER — QUETIAPINE FUMARATE 25 MG PO TABS
25.0000 mg | ORAL_TABLET | Freq: Every day | ORAL | Status: DC
  Administered 2020-06-08 – 2020-06-13 (×6): 25 mg via ORAL
  Filled 2020-06-08 (×6): qty 1

## 2020-06-08 MED ORDER — ARFORMOTEROL TARTRATE 15 MCG/2ML IN NEBU
15.0000 ug | INHALATION_SOLUTION | Freq: Two times a day (BID) | RESPIRATORY_TRACT | Status: DC
  Administered 2020-06-09 – 2020-06-13 (×7): 15 ug via RESPIRATORY_TRACT
  Filled 2020-06-08 (×11): qty 2

## 2020-06-08 MED ORDER — DONEPEZIL HCL 5 MG PO TABS
5.0000 mg | ORAL_TABLET | Freq: Every day | ORAL | Status: DC
  Administered 2020-06-08 – 2020-06-10 (×3): 5 mg via ORAL
  Filled 2020-06-08 (×3): qty 1

## 2020-06-08 MED ORDER — APIXABAN 5 MG PO TABS
5.0000 mg | ORAL_TABLET | Freq: Two times a day (BID) | ORAL | Status: DC
  Administered 2020-06-08 – 2020-06-14 (×13): 5 mg via ORAL
  Filled 2020-06-08 (×13): qty 1

## 2020-06-08 MED ORDER — ATORVASTATIN CALCIUM 20 MG PO TABS
80.0000 mg | ORAL_TABLET | Freq: Every day | ORAL | Status: DC
  Administered 2020-06-08 – 2020-06-14 (×7): 80 mg via ORAL
  Filled 2020-06-08 (×7): qty 4

## 2020-06-08 NOTE — Progress Notes (Signed)
Brief hospitalist update note.  This is a nonbillable note.  Please see same-day H&P from Dr. Cyndia Bent for full billable details.  Briefly, this is a 80 year old male with history of advanced emphysema/COPD/chronic respiratory failure on 3 L, pulmonary embolism on anticoagulation, recent admission for NSTEMI who presents for hypoxia and shortness of breath.  Patient has advanced emphysema and has been dependent on BiPAP for the majority of his hospital stay.  He also has elements of congestive heart failure.  He was started on aggressive COPD and CHF regimen.  We will continue with aggressive nebulizer and steroid regimen for decompensated COPD.  Continue BiPAP as needed.  Continue Lasix for fluid overload.  Patient is a DNR.  Respiratory status very tenuous.  Patient high risk for further decompensation.  Continue monitoring in stepdown status for now  Lolita Patella MD

## 2020-06-08 NOTE — Progress Notes (Signed)
Small ST elevation was noted. EKG was done (placed in the chart., MD notified (will review shortly. Trop lab ordered as well. Pt asymptomatic,  denies pain, but states  he has  SOB (which he had since I received report. O2 increased to 4l at this time) Pt comfortably sits in the chair. Safety monitor is on. Alert/oriented to self, place, time, however forgetful easily about the situation. Redirected easily.

## 2020-06-08 NOTE — H&P (Signed)
History and Physical    Melvin Campbell:[id number] DOB: [birth year] DOA: 06/07/2020  PCP: Patient, No Pcp Per (Inactive)  Patient coming from: [name]  I have personally briefly reviewed patient's old medical records in [facility]  Chief Complaint: Shortness of breath  HPI: Melvin Campbell is a 80 y.o. male with medical history significant for dementia, emphysema with chronic respiratory failure on 3 L, PE on Eliquis, recent NSTEMI, hypertension, BPH, hyperlipidemia who presents from SNF with concerns of hypoxia and shortness of breath.  Patient unable to provide history as he is on BiPAP and received droperidol for agitation prior to my evaluation.  Reportedly, patient has been having increasing shortness of breath throughout today.  He received nebulizer treatment around noon with continual worsening symptoms.  Reportedly was in severe distress on his chronic 3 L with oxygen saturation of 80%.  On EMS arrival, he was placed on nonrebreather which did not increase his O2 above 80.  He was placed on CPAP and transitioned to BiPAP in the ED after 3 duo nebs.  He was also given 125 mg IV Solu-Medrol.  He was also noted to be hypertensive up to 200s and initially received an inch of nitroglycerin paste. Chest x-ray shows vascular congestion and with pulmonary edema.  VBG with CO2 of 97, bicarb 48.  Patient was recently hospitalized from 4/9-4/13 for acute on chronic respiratory failure thought due to worsening of his advanced emphysema.  He also had elevated troponin and cardiology recommended medical management since he is not a candidate for any invasive evaluation.  He was discharged with steroid.   Review of Systems: Unable to obtain given patient's altered mental status    reports that he has never smoked. He has never used smokeless tobacco. He reports previous alcohol use. He reports previous drug use. Social History  Allergies  Allergen Reactions  .  Amlodipine    Family Hx: unable to obtain due to sedation   Prior to Admission medications   Medication Sig Start Date End Date Taking? Authorizing Provider  acetaminophen (TYLENOL) 325 MG tablet Take 650 mg by mouth every 4 (four) hours as needed. 12/31/19  Yes [provider]  albuterol (VENTOLIN HFA) 108 (90 Base) MCG/ACT inhaler Inhale 2 puffs into the lungs every 4 (four) hours as needed. 01/02/20  Yes [provider]  allopurinol (ZYLOPRIM) 300 MG tablet Take 300 mg by mouth daily. 03/23/20  Yes [provider]  ARTIFICIAL TEARS 1.4 % ophthalmic solution Place 1 drop into the right eye every 6 (six) hours. 03/08/20  Yes [provider]  aspirin 81 MG EC tablet Take 1 tablet (81 mg total) by mouth daily. Swallow whole. 04/13/20  Yes Cipriano Bunker, MD  atorvastatin (LIPITOR) 80 MG tablet Take 1 tablet (80 mg total) by mouth daily. 04/13/20  Yes Cipriano Bunker, MD  budesonide (PULMICORT) 0.25 MG/2ML nebulizer solution Take 2 mLs (0.25 mg total) by nebulization 2 (two) times daily. 04/12/20  Yes Cipriano Bunker, MD  donepezil (ARICEPT) 5 MG tablet Take 5 mg by mouth daily. 05/19/20  Yes [provider]  ELIQUIS 5 MG TABS tablet Take 5 mg by mouth 2 (two) times daily. 03/23/20  Yes [provider]  escitalopram (LEXAPRO) 20 MG tablet Take 20 mg by mouth daily. 03/23/20  Yes [provider]  folic acid (FOLVITE) 1 MG tablet Take 1 mg by mouth daily. 03/23/20  Yes [provider]  furosemide (LASIX) 20 MG tablet Take  20 mg by mouth daily as needed. 11/28/19  Yes [provider]  losartan (COZAAR) 100 MG tablet Take 100 mg by mouth daily. 03/23/20  Yes [provider]  magnesium oxide (MAG-OX) 400 (241.3 Mg) MG tablet Take 1 tablet by mouth 2 (two) times daily. 03/23/20  Yes [provider]  metoprolol succinate (TOPROL-XL) 25 MG 24 hr tablet Take 3 tablets (75 mg total) by mouth daily. Take with or immediately  following a meal. 05/30/20  Yes Arnetha Courser, MD  Multiple Vitamins-Minerals (MULTI-DAY PLUS MINERALS) TABS Take 1 tablet by mouth daily. 03/23/20  Yes [provider]  nitroGLYCERIN (NITROSTAT) 0.4 MG SL tablet Place 1 tablet (0.4 mg total) under the tongue every 5 (five) minutes x 3 doses as needed for chest pain. 04/12/20  Yes Cipriano Bunker, MD  prednisoLONE acetate (PRED FORTE) 1 % ophthalmic suspension Place 1 drop into the right eye 2 (two) times daily. 02/28/20  Yes [provider]  QUEtiapine (SEROQUEL) 25 MG tablet Take 25 mg by mouth at bedtime. 05/19/20  Yes [provider]  Thiamine HCl (B-1) 100 MG TABS Take 1 tablet by mouth daily. 03/23/20  Yes [provider]  predniSONE (DELTASONE) 50 MG tablet Take 1 tablet daily for 5 days. Patient not taking: No sig reported 05/29/20   Arnetha Courser, MD    Physical Exam: Vitals:   06/07/20 2330 06/07/20 2334 06/07/20 2336 06/08/20 0000  BP: (!) 126/59   (!) 111/57  Pulse: (!) 49   (!) 49  Resp: (!) 21   18  SpO2: 92%  93% 91%  Weight:  81.5 kg    Height:  5\' 7"  (1.702 m)      Constitutional: NAD, ill-appearing male lethargic on BiPAP after receiving stated medication from ED physician Vitals:   06/07/20 2330 06/07/20 2334 06/07/20 2336 06/08/20 0000  BP: (!) 126/59   (!) 111/57  Pulse: (!) 49   (!) 49  Resp: (!) 21   18  SpO2: 92%  93% 91%  Weight:  81.5 kg    Height:  5\' 7"  (1.702 m)     Eyes: PERRL, lids and conjunctivae normal ENMT: Mucous membranes are moist.  Neck: normal, supple,  Respiratory: Unable to auscultate lungs clearly 1 BiPAP. Normal respiratory effort on BiPAP. No accessory muscle use.  Cardiovascular: Sinus bradycardia, no murmurs / rubs / gallops.  Mild +1 pitting edema of the lower pretibial region bilaterally.   Abdomen: No grimacing or movement with abdominal palpation.  Bowel sounds positive.  Musculoskeletal: no clubbing / cyanosis. No joint deformity upper and lower  extremities.  Skin: no rashes, lesions, ulcers. No induration Neurologic: Patient lethargic and sedated after receiving medication.  Psychiatric: Unable to assess due to patient lethargy.   Labs on Admission: I have personally reviewed following labs and imaging studies  CBC: Recent Labs  Lab 06/07/20 2308  WBC 13.4*  NEUTROABS 9.2*  HGB 14.2  HCT 50.8  MCV 88.8  PLT 182   Basic Metabolic Panel: Recent Labs  Lab 06/07/20 2308  NA 142  K 4.1  CL 95*  CO2 39*  GLUCOSE 122*  BUN 19  CREATININE 0.61  CALCIUM 9.2  MG 2.1   GFR: Estimated Creatinine Clearance: 76.6 mL/min (by C-G formula based on SCr of 0.61 mg/dL). Liver Function Tests: Recent Labs  Lab 06/07/20 2308  AST 24  ALT 58*  ALKPHOS 83  BILITOT 1.2  PROT 6.5  ALBUMIN 3.6   No results for input(s):  LIPASE, AMYLASE in the last 168 hours. No results for input(s): AMMONIA in the last 168 hours. Coagulation Profile: No results for input(s): INR, PROTIME in the last 168 hours. Cardiac Enzymes: No results for input(s): CKTOTAL, CKMB, CKMBINDEX, TROPONINI in the last 168 hours. BNP (last 3 results) No results for input(s): PROBNP in the last 8760 hours. HbA1C: No results for input(s): HGBA1C in the last 72 hours. CBG: No results for input(s): GLUCAP in the last 168 hours. Lipid Profile: No results for input(s): CHOL, HDL, LDLCALC, TRIG, CHOLHDL, LDLDIRECT in the last 72 hours. Thyroid Function Tests: No results for input(s): TSH, T4TOTAL, FREET4, T3FREE, THYROIDAB in the last 72 hours. Anemia Panel: No results for input(s): VITAMINB12, FOLATE, FERRITIN, TIBC, IRON, RETICCTPCT in the last 72 hours. Urine analysis: No results found for: COLORURINE, APPEARANCEUR, LABSPEC, PHURINE, GLUCOSEU, HGBUR, BILIRUBINUR, KETONESUR, PROTEINUR, UROBILINOGEN, NITRITE, LEUKOCYTESUR  Radiological Exams on Admission: DG Chest Portable 1 View  Result Date: 06/07/2020 CLINICAL DATA:  Dyspnea EXAM: PORTABLE CHEST 1 VIEW  COMPARISON:  05/25/2020, CT 05/25/2020 FINDINGS: Stable cardiomediastinal silhouette. Vascular congestion and mild diffuse interstitial and ground-glass opacity likely pulmonary edema. Small left-sided pleural effusion. No pneumothorax. Aortic atherosclerosis. IMPRESSION: Continued vascular congestion and mild diffuse interstitial and ground-glass opacity likely pulmonary edema. Small left-sided pleural effusion. Electronically Signed   By: Jasmine Pang M.D.   On: 06/07/2020 23:15      Assessment/Plan  Acute on chronic hypoxic and hypercapnic respiratory failure from worsening chronic lung disease Pt chronically on 3 L and admitted on BiPAP VBG with pH of 7.3, CO2 97, bicarb 48, repeat VBG in the morning  Duoneb q4hr  IV daily solu-medrol  Consider pulm consult if not improved   Possible new heart failure -CXR shows pulmonary edema -BNP of 429 has been progressively increasing from last two weeks -had possible NSTEMI earlier this year but no invasive intervention due to comorbidies and was medical managment per cardiology  -Last echocardiogram in February showed EF of 60 to 65% -Repeat echocardiogram -Give daily IV Lasix 40 mg - Strict intake and output - Daily weight  Bradycardia Asymptomatic.  Heart rate in the range of 50s. Previously documented to have metoprolol decreased from 75 to 50 mg daily but still had 75mg  on med list Will do 50mg  daily  History of pulmonary embolism Continue Eliquis CTA chest on 4/9 negative for PE  Hypertension Continue losartan metoprolol  Depression Continue Lexapro  History of gout Continue allopurinol  Dementia Continue Aricept and Seroquel  DVT prophylaxis:.Eliquis Code Status: DNR per family Family Communication: No family at bedside  disposition Plan: Home with at least 2 midnight stays  Consults called:  Admission status: inpatient  Level of care: Stepdown  Status is: Inpatient  Remains inpatient appropriate  because:Inpatient level of care appropriate due to severity of illness   Dispo: The patient is from: Home              Anticipated d/c is to: Home              Patient currently is not medically stable to d/c.   Difficult to place patient No         DO [facility]   If 7PM-7AM, please contact night-coverage [email or web address]   06/08/2020, 1:06 AM

## 2020-06-08 NOTE — Progress Notes (Signed)
*  PRELIMINARY RESULTS* Echocardiogram 2D Echocardiogram has been performed. Definity IV Contrast used on this study.  Melvin Campbell 06/08/2020, 11:43 AM

## 2020-06-09 DIAGNOSIS — J9622 Acute and chronic respiratory failure with hypercapnia: Secondary | ICD-10-CM | POA: Diagnosis not present

## 2020-06-09 DIAGNOSIS — J9621 Acute and chronic respiratory failure with hypoxia: Secondary | ICD-10-CM | POA: Diagnosis not present

## 2020-06-09 LAB — TROPONIN I (HIGH SENSITIVITY): Troponin I (High Sensitivity): 11 ng/L (ref <18)

## 2020-06-09 LAB — MRSA PCR SCREENING: MRSA by PCR: NEGATIVE

## 2020-06-09 MED ORDER — METHYLPREDNISOLONE SODIUM SUCC 40 MG IJ SOLR
40.0000 mg | Freq: Three times a day (TID) | INTRAMUSCULAR | Status: DC
  Administered 2020-06-09 – 2020-06-10 (×3): 40 mg via INTRAVENOUS
  Filled 2020-06-09 (×3): qty 1

## 2020-06-09 NOTE — TOC Progression Note (Signed)
Transition of Care [facility]) - Progression Note    Patient Details  Name: Melvin Campbell MRN: [id number] Date of Birth: 1940-07-05  Transition of Care [facility]) CM/SW Contact  Marina Goodell Phone Number: [phone] 06/09/2020, 1:45 PM  Clinical Narrative:     Patient presents to [facility] due to SOB and walking without his O2 tank. Patient has hx of dementia and lives at [facility].  Patient's daughter Sloan, Takagi [phone] is main contact and decision maker. CSW explained role of TOC in patient care. Ms. Aguinaldo stated the patient is unable to perform ADLs on his own. Patient uses a walker to ambulate, can dress himself but takes a time due to consistent SOB. Patient is able to eat independently but needs supervision to take his medications due to forgetfulness.  Ms. Vanaman stated she would prefer for the patient to return to [facility] and receive home health.  Ms. Eschmann stated she has concerns with the patient becoming confused if he is placed in a SNF and then returning to the ALF.  TOC will continue to follow for further needs.  Expected Discharge Plan: Long Term Nursing Home Barriers to Discharge: Continued Medical Work up  Expected Discharge Plan and Services Expected Discharge Plan: Long Term Nursing Home In-house Referral: Clinical Social Work   Post Acute Care Choice: Home Health ([facility]) Living arrangements for the past 2 months: Assisted Living Facility ([facility]                                       Social Determinants of Health (SDOH) Interventions    Readmission Risk Interventions No flowsheet data found.

## 2020-06-09 NOTE — NC FL2 (Signed)
[place] MEDICAID FL2 LEVEL OF CARE SCREENING TOOL     IDENTIFICATION  Patient Name: Melvin Campbell Birthdate: 1940/04/17 Sex: male Admission Date (Current Location): 06/07/2020  [place] and [place] Number:  Chiropodist and Address:  [facility], [address] [place], [place] [address]      Provider Number: [id number]  Attending Physician Name and Address:  Tresa Moore, MD  Relative Name and Phone Number:  Berry, Godsey (Daughter)   [phone]    Current Level of Care: Hospital Recommended Level of Care: Assisted Living Facility ([facility]) Prior Approval Number:    Date Approved/Denied:   PASRR Number:    Discharge Plan: Other (Comment) (ALF - [name] )    Current Diagnoses: Patient Active Problem List   Diagnosis Date Noted  . Acute on chronic respiratory failure with hypoxia and hypercapnia (HCC) 06/08/2020  . Bradycardia 06/08/2020  . History of pulmonary embolism 06/08/2020  . HTN (hypertension) 06/08/2020  . Depression 06/08/2020  . Dementia with behavioral disturbance (HCC) 06/08/2020  . Gout 06/08/2020  . DNR (do not resuscitate) 06/08/2020  . Shortness of breath   . Hypoxia   . ACS (acute coronary syndrome) (HCC) 05/25/2020  . Acute on chronic respiratory failure with hypoxia (HCC) 03/31/2020  . Elevated troponin 03/31/2020  . Community acquired pneumonia 03/31/2020  . Bilateral lower extremity edema 03/31/2020    Orientation RESPIRATION BLADDER Height & Weight     Self,Place  Normal Continent Weight: 185 lb 13.6 oz (84.3 kg) Height:  5\' 7"  (170.2 cm)  BEHAVIORAL SYMPTOMS/MOOD NEUROLOGICAL BOWEL NUTRITION STATUS      Continent Diet  AMBULATORY STATUS COMMUNICATION OF NEEDS Skin   Supervision Verbally Normal                       Personal Care Assistance Level of Assistance  Bathing,Feeding,Dressing,Total care Bathing Assistance: Independent Feeding assistance:  Independent Dressing Assistance: Independent Total Care Assistance: Limited assistance   Functional Limitations Info  Sight,Hearing,Speech Sight Info: Adequate Hearing Info: Adequate Speech Info: Adequate    SPECIAL CARE FACTORS FREQUENCY       Home Health PT 5X per week                Contractures Contractures Info: Not present    Additional Factors Info                  Current Medications (06/09/2020):  This is the current hospital active medication list Current Facility-Administered Medications  Medication Dose Route Frequency Provider Last Rate Last Admin  . allopurinol (ZYLOPRIM) tablet 300 mg  300 mg Oral Daily Tu, Ching T, DO   300 mg at 06/09/20 0925  . apixaban (ELIQUIS) tablet 5 mg  5 mg Oral BID Tu, Ching T, DO   5 mg at 06/09/20 06/11/20  . arformoterol (BROVANA) nebulizer solution 15 mcg  15 mcg Nebulization BID [id number] B, MD   15 mcg at 06/09/20 0719  . aspirin EC tablet 81 mg  81 mg Oral Daily Tu, Ching T, DO   81 mg at 06/09/20 0925  . atorvastatin (LIPITOR) tablet 80 mg  80 mg Oral Daily Tu, Ching T, DO   80 mg at 06/09/20 0925  . budesonide (PULMICORT) nebulizer solution 0.25 mg  0.25 mg Nebulization BID 06/11/20 B, MD   0.25 mg at 06/09/20 0720  . Chlorhexidine Gluconate Cloth 2 % PADS 6 each  6 each  Topical Daily Erin Fulling, MD   6 each at 06/09/20 1306  . donepezil (ARICEPT) tablet 5 mg  5 mg Oral Daily Tu, Ching T, DO   5 mg at 06/09/20 1113  . escitalopram (LEXAPRO) tablet 20 mg  20 mg Oral Daily Tu, Ching T, DO   20 mg at 06/09/20 1113  . folic acid (FOLVITE) tablet 1 mg  1 mg Oral Daily Tu, Ching T, DO   1 mg at 06/09/20 0925  . furosemide (LASIX) injection 40 mg  40 mg Intravenous Daily Tu, Ching T, DO   40 mg at 06/09/20 0926  . ipratropium-albuterol (DUONEB) 0.5-2.5 (3) MG/3ML nebulizer solution 3 mL  3 mL Nebulization Q4H Tu, Ching T, DO   3 mL at 06/09/20 1106  . losartan (COZAAR) tablet 100 mg  100 mg Oral Daily Tu, Ching  T, DO   100 mg at 06/09/20 1113  . magnesium oxide (MAG-OX) tablet 400 mg  400 mg Oral BID Tu, Ching T, DO   400 mg at 06/09/20 0925  . methylPREDNISolone sodium succinate (SOLU-MEDROL) 40 mg/mL injection 40 mg  40 mg Intravenous Q8H Sreenath, Sudheer B, MD   40 mg at 06/09/20 1304  . metoprolol succinate (TOPROL-XL) 24 hr tablet 50 mg  50 mg Oral Daily Tu, Ching T, DO   50 mg at 06/09/20 1112  . QUEtiapine (SEROQUEL) tablet 25 mg  25 mg Oral QHS Tu, Ching T, DO   25 mg at 06/08/20 2107  . thiamine tablet 100 mg  100 mg Oral Daily Tu, Ching T, DO   100 mg at 06/09/20 6270     Discharge Medications: Please see discharge summary for a list of discharge medications.  Relevant Imaging Results:  Relevant Lab Results:   Additional Information SS# [id number]  Joseph Art, LCSWA

## 2020-06-09 NOTE — Progress Notes (Signed)
PROGRESS NOTE    Melvin Campbell  QMV:[id number] DOB: 1940/06/22 DOA: 06/07/2020 PCP: Patient, No Pcp Per (Inactive)   Brief Narrative:  80 year old male with history of advanced emphysema/COPD/chronic respiratory failure on 3 L, pulmonary embolism on anticoagulation, recent admission for NSTEMI who presents for hypoxia and shortness of breath.  Patient has advanced emphysema and has been dependent on BiPAP for the majority of his hospital stay.  He also has elements of congestive heart failure.  He was started on aggressive COPD and CHF regimen.  Clinical status has been improving.  Patient is on 3 to 4 L during the day and NIPPV at night.  Seems to be tolerating this well.  Making good urine.  Mental status also improving.   Assessment & Plan:   Principal Problem:   Acute on chronic respiratory failure with hypoxia and hypercapnia (HCC) Active Problems:   Bradycardia   History of pulmonary embolism   HTN (hypertension)   Depression   Dementia with behavioral disturbance (HCC)   Gout   DNR (do not resuscitate)  Acute on chronic hypoxic and hypercapnic respiratory failure from worsening chronic lung disease Pt chronically on 3 L and admitted on BiPAP VBG with pH of 7.3, CO2 97, bicarb 48 Respiratory status improving Plan: Continue multimodal nebulizer regimen Duo nebs every 4 hours Brovana twice daily Pulmicort twice daily IV Solu-Medrol 40 mg every 8 hours Submental oxygen wean as tolerated Goal saturation 88-92%  Acute on chronic diastolic congestive heart failure -CXR shows pulmonary edema -BNP of 429 has been progressively increasing from last two weeks -had possible NSTEMI earlier this year but no invasive intervention due to comorbidies and was medical managment per cardiology  -Last echocardiogram in February showed EF of 60 to 65% -Repeat echocardiogram normal EF with grade 2 diastolic dysfunction Plan: Lasix 40 mg IV twice daily Strict I's and O's Daily  weights Cardiac diet  Bradycardia Asymptomatic.  Heart rate in the range of 50s. Previously documented to have metoprolol decreased from 75 to 50 mg daily but still had 75mg  on med list Will do 50mg  daily  History of pulmonary embolism Continue Eliquis CTA chest on 4/9 negative for PE  Hypertension Continue losartan metoprolol  Depression Continue Lexapro  History of gout Continue allopurinol  Dementia Continue Aricept and Seroquel   DVT prophylaxis: Eliquis Code Status: DNR Family Communication: None Disposition Plan: Status is: Inpatient  Remains inpatient appropriate because:Inpatient level of care appropriate due to severity of illness   Dispo: The patient is from: Home              Anticipated d/c is to: Home              Patient currently is not medically stable to d/c.   Difficult to place patient No  Improvement and respiratory failure in setting of decompensated COPD/CHF.  Transferring out of ICU today.  Therapy evaluations requested.  Possible disposition within 24 to 48 hours.     Level of care: Progressive Cardiac  Consultants:   None   Procedures: None  Antimicrobials:   None   Subjective: Seen and examined.  Sitting up in chair.  Tolerating p.o. diet.  Reports improvement in respiratory status.  Objective: Vitals:   06/09/20 0800 06/09/20 0900 06/09/20 1107 06/09/20 1112  BP: (!) 160/75 (!) 144/72  (!) 173/70  Pulse: (!) 59 (!) 56  71  Resp: (!) 21 20    Temp: 97.7 F (36.5 C)     TempSrc: Oral  SpO2: 91% 97% 92%   Weight:      Height:        Intake/Output Summary (Last 24 hours) at 06/09/2020 1230 Last data filed at 06/09/2020 1100 Gross per 24 hour  Intake 240 ml  Output 2350 ml  Net -2110 ml   Filed Weights   06/07/20 2334 06/08/20 0230  Weight: 81.5 kg 84.3 kg    Examination:   General exam: No acute distress.  Appears fatigued Respiratory system: Bibasilar crackles.  Normal work of breathing.  4  L Cardiovascular system: Tachycardic, S1-S2, no murmurs Gastrointestinal system: Abdomen is nondistended, soft and nontender. No organomegaly or masses felt. Normal bowel sounds heard. Central nervous system: Alert and oriented. No focal neurological deficits. Extremities: Symmetric 5 x 5 power. Skin: No rashes, lesions or ulcers Psychiatry: Judgement and insight appear normal. Mood & affect appropriate.     Data Reviewed: I have personally reviewed following labs and imaging studies  CBC: Recent Labs  Lab 06/07/20 2308 06/08/20 0546  WBC 13.4* 10.3  NEUTROABS 9.2*  --   HGB 14.2 13.8  HCT 50.8 49.0  MCV 88.8 88.1  PLT 182 155   Basic Metabolic Panel: Recent Labs  Lab 06/07/20 2308 06/08/20 0546  NA 142 141  K 4.1 4.7  CL 95* 94*  CO2 39* 38*  GLUCOSE 122* 164*  BUN 19 20  CREATININE 0.61 0.75  CALCIUM 9.2 9.1  MG 2.1  --    GFR: Estimated Creatinine Clearance: 77.7 mL/min (by C-G formula based on SCr of 0.75 mg/dL). Liver Function Tests: Recent Labs  Lab 06/07/20 2308  AST 24  ALT 58*  ALKPHOS 83  BILITOT 1.2  PROT 6.5  ALBUMIN 3.6   No results for input(s): LIPASE, AMYLASE in the last 168 hours. No results for input(s): AMMONIA in the last 168 hours. Coagulation Profile: No results for input(s): INR, PROTIME in the last 168 hours. Cardiac Enzymes: No results for input(s): CKTOTAL, CKMB, CKMBINDEX, TROPONINI in the last 168 hours. BNP (last 3 results) No results for input(s): PROBNP in the last 8760 hours. HbA1C: No results for input(s): HGBA1C in the last 72 hours. CBG: No results for input(s): GLUCAP in the last 168 hours. Lipid Profile: No results for input(s): CHOL, HDL, LDLCALC, TRIG, CHOLHDL, LDLDIRECT in the last 72 hours. Thyroid Function Tests: No results for input(s): TSH, T4TOTAL, FREET4, T3FREE, THYROIDAB in the last 72 hours. Anemia Panel: No results for input(s): VITAMINB12, FOLATE, FERRITIN, TIBC, IRON, RETICCTPCT in the last 72  hours. Sepsis Labs: Recent Labs  Lab 06/07/20 2308  PROCALCITON <0.10    Recent Results (from the past 240 hour(s))  Resp Panel by RT-PCR (Flu A&B, Covid) Nasopharyngeal Swab     Status: None   Collection Time: 06/07/20 11:08 PM   Specimen: Nasopharyngeal Swab; Nasopharyngeal(NP) swabs in vial transport medium  Result Value Ref Range Status   SARS Coronavirus 2 by RT PCR NEGATIVE NEGATIVE Final    Comment: (NOTE) SARS-CoV-2 target nucleic acids are NOT DETECTED.  The SARS-CoV-2 RNA is generally detectable in upper respiratory specimens during the acute phase of infection. The lowest concentration of SARS-CoV-2 viral copies this assay can detect is 138 copies/mL. A negative result does not preclude SARS-Cov-2 infection and should not be used as the sole basis for treatment or other patient management decisions. A negative result may occur with  improper specimen collection/handling, submission of specimen other than nasopharyngeal swab, presence of viral mutation(s) within the areas targeted by this assay,  and inadequate number of viral copies(<138 copies/mL). A negative result must be combined with clinical observations, patient history, and epidemiological information. The expected result is Negative.  Fact Sheet for Patients:  BloggerCourse.com  Fact Sheet for Healthcare Providers:  SeriousBroker.it  This test is no t yet approved or cleared by the [place] FDA and  has been authorized for detection and/or diagnosis of SARS-CoV-2 by FDA under an Emergency Use Authorization (EUA). This EUA will remain  in effect (meaning this test can be used) for the duration of the COVID-19 declaration under Section 564(b)(1) of the Act, 21 U.S.C.section 360bbb-3(b)(1), unless the authorization is terminated  or revoked sooner.       Influenza A by PCR NEGATIVE NEGATIVE Final   Influenza B by PCR NEGATIVE NEGATIVE Final     Comment: (NOTE) The Xpert Xpress SARS-CoV-2/FLU/RSV plus assay is intended as an aid in the diagnosis of influenza from Nasopharyngeal swab specimens and should not be used as a sole basis for treatment. Nasal washings and aspirates are unacceptable for Xpert Xpress SARS-CoV-2/FLU/RSV testing.  Fact Sheet for Patients: BloggerCourse.com  Fact Sheet for Healthcare Providers: SeriousBroker.it  This test is not yet approved or cleared by the [place] FDA and has been authorized for detection and/or diagnosis of SARS-CoV-2 by FDA under an Emergency Use Authorization (EUA). This EUA will remain in effect (meaning this test can be used) for the duration of the COVID-19 declaration under Section 564(b)(1) of the Act, 21 U.S.C. section 360bbb-3(b)(1), unless the authorization is terminated or revoked.  Performed at [facility], [address] [place], [place] [address]   MRSA PCR Screening     Status: None   Collection Time: 06/09/20  1:34 AM   Specimen: Nasal Mucosa; Nasopharyngeal  Result Value Ref Range Status   MRSA by PCR NEGATIVE NEGATIVE Final    Comment:        The GeneXpert MRSA Assay (FDA approved for NASAL specimens only), is one component of a comprehensive MRSA colonization surveillance program. It is not intended to diagnose MRSA infection nor to guide or monitor treatment for MRSA infections. Performed at [facility], [address] [place], [place] [address]          Radiology Studies: DG Chest Portable 1 View  Result Date: 06/07/2020 CLINICAL DATA:  Dyspnea EXAM: PORTABLE CHEST 1 VIEW COMPARISON:  05/25/2020, CT 05/25/2020 FINDINGS: Stable cardiomediastinal silhouette. Vascular congestion and mild diffuse interstitial and ground-glass opacity likely pulmonary edema. Small left-sided pleural effusion. No pneumothorax. Aortic atherosclerosis. IMPRESSION: Continued vascular congestion  and mild diffuse interstitial and ground-glass opacity likely pulmonary edema. Small left-sided pleural effusion. Electronically Signed   By: Jasmine Pang M.D.   On: 06/07/2020 23:15   ECHOCARDIOGRAM COMPLETE  Result Date: 06/08/2020    ECHOCARDIOGRAM REPORT   Patient Name:   Melvin Campbell Date of Exam: 06/08/2020 Medical Rec #:  [id number]          Height:       67.0 in Accession #:    [id number]         Weight:       185.8 lb Date of Birth:  07-03-1940           BSA:          1.960 m Patient Age:    79 years           BP:           128/67 mmHg Patient Gender: M  HR:           65 bpm. Exam Location:  [facility] Procedure: 2D Echo and Intracardiac Opacification Agent Indications:     Elevated Troponin  History:         Patient has prior history of Echocardiogram examinations, most                  recent 04/01/2020.  Sonographer:     Wonda Cerise RDCS Referring Phys:  [id number] CHING T TU Diagnosing Phys: Julien Nordmann MD  Sonographer Comments: Technically difficult study due to poor echo windows. Image acquisition challenging due to patient body habitus. IMPRESSIONS  1. Left ventricular ejection fraction, by estimation, is 60 to 65%. The left ventricle has normal function. The left ventricle has no regional wall motion abnormalities. Left ventricular diastolic parameters are consistent with Grade II diastolic dysfunction (pseudonormalization).  2. Right ventricular systolic function is normal. The right ventricular size is normal. Tricuspid regurgitation signal is inadequate for assessing PA pressure.  3. Left atrial size was mildly dilated.  4. There is severe calcifcation of the aortic valve. Suspect at least mild to moderate, possibly moderate aortic stenosis visually, though unable to accurately estimate as pressure gradients not recorded secondary to challenging images. FINDINGS  Left Ventricle: Left ventricular ejection fraction, by estimation, is 60 to 65%. The left ventricle has normal  function. The left ventricle has no regional wall motion abnormalities. Definity contrast agent was given IV to delineate the left ventricular  endocardial borders. The left ventricular internal cavity size was normal in size. There is no left ventricular hypertrophy. Left ventricular diastolic parameters are consistent with Grade II diastolic dysfunction (pseudonormalization). Right Ventricle: The right ventricular size is normal. No increase in right ventricular wall thickness. Right ventricular systolic function is normal. Tricuspid regurgitation signal is inadequate for assessing PA pressure. Left Atrium: Left atrial size was mildly dilated. Right Atrium: Right atrial size was normal in size. Pericardium: There is no evidence of pericardial effusion. Mitral Valve: The mitral valve is normal in structure. Moderate mitral annular calcification. No evidence of mitral valve regurgitation. No evidence of mitral valve stenosis. Tricuspid Valve: The tricuspid valve is normal in structure. Tricuspid valve regurgitation is not demonstrated. No evidence of tricuspid stenosis. Aortic Valve: The aortic valve is normal in structure. There is severe calcifcation of the aortic valve. Aortic valve regurgitation is not visualized. No aortic stenosis is present. Pulmonic Valve: The pulmonic valve was normal in structure. Pulmonic valve regurgitation is not visualized. No evidence of pulmonic stenosis. Aorta: The aortic root is normal in size and structure. Venous: The inferior vena cava is normal in size with greater than 50% respiratory variability, suggesting right atrial pressure of 3 mmHg. IAS/Shunts: No atrial level shunt detected by color flow Doppler.  LEFT VENTRICLE PLAX 2D LVIDd:         5.23 cm  Diastology LVIDs:         4.04 cm  LV e' medial:    3.70 cm/s LV PW:         1.07 cm  LV E/e' medial:  29.5 LV IVS:        1.16 cm  LV e' lateral:   5.98 cm/s LVOT diam:     2.10 cm  LV E/e' lateral: 18.2 LVOT Area:     3.46 cm   RIGHT VENTRICLE RV S prime:     17.70 cm/s TAPSE (M-mode): 2.6 cm LEFT ATRIUM  Index LA diam:        4.20 cm 2.14 cm/m LA Vol (A2C):   47.4 ml 24.18 ml/m LA Vol (A4C):   34.2 ml 17.45 ml/m LA Biplane Vol: 43.5 ml 22.19 ml/m                        PULMONIC VALVE AORTA                 PV Vmax:       1.06 m/s Ao Root diam: 3.90 cm PV Peak grad:  4.5 mmHg Ao Asc diam:  3.50 cm  MITRAL VALVE MV Area (PHT): 5.06 cm     SHUNTS MV Decel Time: 150 msec     Systemic Diam: 2.10 cm MV E velocity: 109.00 cm/s MV A velocity: 95.10 cm/s MV E/A ratio:  1.15 Julien Nordmannimothy Gollan MD Electronically signed by Julien Nordmannimothy Gollan MD Signature Date/Time: 06/08/2020/1:44:59 PM    Final         Scheduled Meds: . allopurinol  300 mg Oral Daily  . apixaban  5 mg Oral BID  . arformoterol  15 mcg Nebulization BID  . aspirin EC  81 mg Oral Daily  . atorvastatin  80 mg Oral Daily  . budesonide (PULMICORT) nebulizer solution  0.25 mg Nebulization BID  . Chlorhexidine Gluconate Cloth  6 each Topical Daily  . donepezil  5 mg Oral Daily  . escitalopram  20 mg Oral Daily  . folic acid  1 mg Oral Daily  . furosemide  40 mg Intravenous Daily  . ipratropium-albuterol  3 mL Nebulization Q4H  . losartan  100 mg Oral Daily  . magnesium oxide  400 mg Oral BID  . methylPREDNISolone (SOLU-MEDROL) injection  40 mg Intravenous Q8H  . metoprolol succinate  50 mg Oral Daily  . QUEtiapine  25 mg Oral QHS  . thiamine  100 mg Oral Daily   Continuous Infusions:   LOS: 1 day    Time spent: 25 minutes    Tresa MooreSudheer B Shaliyah Taite, MD [facility] Pager [phone]  If 7PM-7AM, please contact night-coverage 06/09/2020, 12:30 PM

## 2020-06-10 DIAGNOSIS — J9622 Acute and chronic respiratory failure with hypercapnia: Secondary | ICD-10-CM | POA: Diagnosis not present

## 2020-06-10 DIAGNOSIS — J9621 Acute and chronic respiratory failure with hypoxia: Secondary | ICD-10-CM | POA: Diagnosis not present

## 2020-06-10 LAB — CBC WITH DIFFERENTIAL/PLATELET
Abs Immature Granulocytes: 0.06 10*3/uL (ref 0.00–0.07)
Basophils Absolute: 0 10*3/uL (ref 0.0–0.1)
Basophils Relative: 0 %
Eosinophils Absolute: 0 10*3/uL (ref 0.0–0.5)
Eosinophils Relative: 0 %
HCT: 52.1 % — ABNORMAL HIGH (ref 39.0–52.0)
Hemoglobin: 14.7 g/dL (ref 13.0–17.0)
Immature Granulocytes: 0 %
Lymphocytes Relative: 3 %
Lymphs Abs: 0.6 10*3/uL — ABNORMAL LOW (ref 0.7–4.0)
MCH: 24.9 pg — ABNORMAL LOW (ref 26.0–34.0)
MCHC: 28.2 g/dL — ABNORMAL LOW (ref 30.0–36.0)
MCV: 88.2 fL (ref 80.0–100.0)
Monocytes Absolute: 0.3 10*3/uL (ref 0.1–1.0)
Monocytes Relative: 2 %
Neutro Abs: 15.7 10*3/uL — ABNORMAL HIGH (ref 1.7–7.7)
Neutrophils Relative %: 95 %
Platelets: 202 10*3/uL (ref 150–400)
RBC: 5.91 MIL/uL — ABNORMAL HIGH (ref 4.22–5.81)
RDW: 16.3 % — ABNORMAL HIGH (ref 11.5–15.5)
WBC: 16.6 10*3/uL — ABNORMAL HIGH (ref 4.0–10.5)
nRBC: 0 % (ref 0.0–0.2)

## 2020-06-10 LAB — BASIC METABOLIC PANEL
Anion gap: 6 (ref 5–15)
BUN: 26 mg/dL — ABNORMAL HIGH (ref 8–23)
CO2: 46 mmol/L — ABNORMAL HIGH (ref 22–32)
Calcium: 9.5 mg/dL (ref 8.9–10.3)
Chloride: 89 mmol/L — ABNORMAL LOW (ref 98–111)
Creatinine, Ser: 0.61 mg/dL (ref 0.61–1.24)
GFR, Estimated: >60 mL/min (ref >60)
Glucose, Bld: 125 mg/dL — ABNORMAL HIGH (ref 70–99)
Potassium: 4.6 mmol/L (ref 3.5–5.1)
Sodium: 141 mmol/L (ref 135–145)

## 2020-06-10 LAB — GLUCOSE, CAPILLARY: Glucose-Capillary: 154 mg/dL — ABNORMAL HIGH (ref 70–99)

## 2020-06-10 MED ORDER — IPRATROPIUM-ALBUTEROL 0.5-2.5 (3) MG/3ML IN SOLN
3.0000 mL | Freq: Four times a day (QID) | RESPIRATORY_TRACT | Status: DC
  Administered 2020-06-10 – 2020-06-12 (×8): 3 mL via RESPIRATORY_TRACT
  Filled 2020-06-10 (×8): qty 3

## 2020-06-10 MED ORDER — METHYLPREDNISOLONE SODIUM SUCC 40 MG IJ SOLR
40.0000 mg | Freq: Two times a day (BID) | INTRAMUSCULAR | Status: DC
  Administered 2020-06-10 – 2020-06-12 (×5): 40 mg via INTRAVENOUS
  Filled 2020-06-10 (×5): qty 1

## 2020-06-10 NOTE — Progress Notes (Signed)
Patient removed bipap and is refusing to wear bipap, O2 sats down to 79%. 4L [place] placed back on patient.

## 2020-06-10 NOTE — Evaluation (Signed)
Occupational Therapy Evaluation Patient Details Name: Melvin Campbell MRN: [id number] DOB: 03-Sep-1940 Today's Date: 06/10/2020    History of Present Illness Pt is a 80 year old male with history of advanced emphysema/COPD/chronic respiratory failure on 3 L, pulmonary embolism on anticoagulation, recent admission for NSTEMI who presents for hypoxia and shortness of breath, placed on bipap, progressed to [place] during the day and NIPPV at night.   Clinical Impression   Melvin Campbell was seen for OT evaluation this date. Per chart, PTA pt was MOD I for mobility and ADLs using RW and O2. Pt lives at [facility]. Pt alert and oriented to self and location (hospital in [place]). Disoriented to situation (states here for his eye) and PLOF is differing from social history listed in chart (denies RW and O2 use). Pt presents to acute OT demonstrating impaired ADL performance and functional mobility 2/2 decreased activity tolerance and functional strength/balance deficits.  Pt currently requires MOD I don/doff B socks seated EOC. CGA functional reach inside BOS standing EOC. SBA for ADL t/f using hand held support. Pt completed seated therex as described below. SpO2 desat 85% standing marching on 4L [place]. Remained 90% on 4L for seated exercises. Pt would benefit from skilled OT to address noted impairments and functional limitations (see below for any additional details) in order to maximize safety and independence while minimizing falls risk and caregiver burden. Upon hospital discharge, recommend HHOT to maximize pt safety and return to functional independence during meaningful occupations of daily life.     Follow Up Recommendations  Home health OT;Supervision - Intermittent    Equipment Recommendations  None recommended by OT    Recommendations for Other Services       Precautions / Restrictions Precautions Precautions: Fall Restrictions Weight Bearing Restrictions: No       Mobility Bed Mobility     General bed mobility comments: pt received and left up in chair    Transfers Overall transfer level: Needs assistance Equipment used: 1 person hand held assist Transfers: Sit to/from Stand Sit to Stand: Min guard         General transfer comment: no assist to stand    Balance Overall balance assessment: Needs assistance Sitting-balance support: Feet supported Sitting balance-Leahy Scale: Good     Standing balance support: No upper extremity supported;During functional activity Standing balance-Leahy Scale: Fair Standing balance comment: single UE and no UE support reaching inside BOS w/o LOBs                           ADL either performed or assessed with clinical judgement   ADL Overall ADL's : Needs assistance/impaired                                       General ADL Comments: MOD I don/doff B socks seated EOC. CGA functional reach inside BOS standing EOC. SBA for ADL t/f using hand held support     Vision Baseline Vision/History: Wears glasses Wears Glasses: At all times              Pertinent Vitals/Pain Pain Assessment: No/denies pain     Hand Dominance Left   Extremity/Trunk Assessment Upper Extremity Assessment Upper Extremity Assessment: Overall WFL for tasks assessed   Lower Extremity Assessment Lower Extremity Assessment: Generalized weakness   Cervical / Trunk Assessment Cervical / Trunk Assessment: Normal  Communication Communication Communication: No difficulties   Cognition Arousal/Alertness: Awake/alert Behavior During Therapy: WFL for tasks assessed/performed Overall Cognitive Status: No family/caregiver present to determine baseline cognitive functioning                                 General Comments: Pt alert and oriented to self and location (hospital in [place]). Disoriented to situation (states here for his eye) and PLOF is differing from social  history listed in chart (denies RW and O2 use)   General Comments  SpO2 desat 85% standing marching on 4L [place]. Remained 90% on 4L for seated exercises    Exercises Exercises: General Lower Extremity;General Upper Extremity;Other exercises General Exercises - Upper Extremity Shoulder Flexion: AROM;Strengthening;Both;10 reps;Seated Shoulder Extension: AROM;Strengthening;Both;10 reps;Seated Shoulder Horizontal ABduction: AROM;Strengthening;Both;10 reps;Seated Shoulder Horizontal ADduction: AROM;Strengthening;Both;10 reps;Seated Elbow Flexion: AROM;Strengthening;Both;10 reps;Seated Elbow Extension: AROM;Strengthening;Both;10 reps;Seated General Exercises - Lower Extremity Gluteal Sets: AROM;Strengthening;Both;10 reps;Seated Long Arc Quad: AROM;Strengthening;Both;10 reps;Seated Hip ABduction/ADduction: AROM;Strengthening;Both;10 reps;Seated Hip Flexion/Marching: AROM;Strengthening;Both;10 reps;Seated;Standing Toe Raises: AROM;Strengthening;Both;10 reps;Seated Heel Raises: AROM;Strengthening;Both;10 reps;Seated Other Exercises Other Exercises: Pt educated re: OT role, DME recs, d/c recs, falls prevention, ECS Other Exercises: LBD, sit<>Stand x2, sitting/standing balance/tolerance   Shoulder Instructions      Home Living Family/patient expects to be discharged to:: Assisted living                             Home Equipment: None   Additional Comments: Pt is a questionable historian. Pt reports living alone in a 1-story home with walk-in-shower, however chart review reveals that pt is a resident at [facility].      Prior Functioning/Environment Level of Independence: Independent        Comments: Pt reports being indep with ADLs, household mobilization without assist device; per chart, home O2 at 3L (patient denies).  Denies fall history.        OT Problem List: Decreased strength;Decreased activity tolerance;Impaired balance (sitting and/or standing);Decreased safety  awareness;Cardiopulmonary status limiting activity      OT Treatment/Interventions: Self-care/ADL training;Therapeutic exercise;Energy conservation;DME and/or AE instruction;Therapeutic activities;Patient/family education;Balance training    OT Goals(Current goals can be found in the care plan section) Acute Rehab OT Goals Patient Stated Goal: to get back home OT Goal Formulation: With patient Time For Goal Achievement: 06/24/20 Potential to Achieve Goals: Good ADL Goals Pt Will Perform Grooming: with modified independence;standing (c LRAD PRN) Pt Will Perform Lower Body Dressing: Independently;sit to/from stand Pt Will Transfer to Toilet: with modified independence;ambulating;regular height toilet (c LRAD PRN)  OT Frequency: Min 1X/week    AM-PAC OT "6 Clicks" Daily Activity     Outcome Measure Help from another person eating meals?: None Help from another person taking care of personal grooming?: A Little Help from another person toileting, which includes using toliet, bedpan, or urinal?: A Little Help from another person bathing (including washing, rinsing, drying)?: A Little Help from another person to put on and taking off regular upper body clothing?: A Little Help from another person to put on and taking off regular lower body clothing?: A Little 6 Click Score: 19   End of Session Equipment Utilized During Treatment: Oxygen  Activity Tolerance: Patient tolerated treatment well Patient left: in chair;with call bell/phone within reach;with chair alarm set  OT Visit Diagnosis: Unsteadiness on feet (R26.81)  Time: 2751-7001 OT Time Calculation (min): 17 min Charges:  OT General Charges $OT Visit: 1 Visit OT Evaluation $OT Eval Low Complexity: 1 Low OT Treatments $Therapeutic Exercise: 8-22 mins  Kathie Dike, M.S. OTR/L  06/10/20, 2:53 PM  ascom [phone]

## 2020-06-10 NOTE — Progress Notes (Signed)
PROGRESS NOTE    Melvin Campbell  WUJ:[id number] DOB: 06/30/1940 DOA: 06/07/2020 PCP: Patient, No Pcp Per (Inactive)   Brief Narrative:  80 year old male with history of advanced emphysema/COPD/chronic respiratory failure on 3 L, pulmonary embolism on anticoagulation, recent admission for NSTEMI who presents for hypoxia and shortness of breath.  Patient has advanced emphysema and has been dependent on BiPAP for the majority of his hospital stay.  He also has elements of congestive heart failure.  He was started on aggressive COPD and CHF regimen.  Clinical status has been improving.  Patient is on 3 to 4 L during the day and NIPPV at night.  Seems to be tolerating this well.  Making good urine.  Mental status also improving.   Assessment & Plan:   Principal Problem:   Acute on chronic respiratory failure with hypoxia and hypercapnia (HCC) Active Problems:   Bradycardia   History of pulmonary embolism   HTN (hypertension)   Depression   Dementia with behavioral disturbance (HCC)   Gout   DNR (do not resuscitate)  Acute on chronic hypoxic and hypercapnic respiratory failure from worsening chronic lung disease Pt chronically on 3 L and admitted on BiPAP VBG with pH of 7.3, CO2 97, bicarb 48 Respiratory status improving Developing metabolic alkalosis, suspect contraction Plan: Continue multimodal nebulizer regimen DuoNebs every 6 hours Brovana twice daily Pulmicort twice daily IV Solu-Medrol 40 mg every 12 hours Suspect transition to p.o. prednisone tomorrow Submental oxygen wean as tolerated Goal saturation 88-92%  Acute on chronic diastolic congestive heart failure -CXR shows pulmonary edema -BNP of 429 has been progressively increasing from last two weeks -had possible NSTEMI earlier this year but no invasive intervention due to comorbidies and was medical managment per cardiology  -Last echocardiogram in February showed EF of 60 to 65% -Repeat echocardiogram normal  EF with grade 2 diastolic dysfunction -Bicarb increasing, suspect contraction alkalosis Plan: Hold Lasix for now Strict I's and O's Daily weights Cardiac diet  Bradycardia Asymptomatic.  Heart rate in the range of 50s. Previously documented to have metoprolol decreased from 75 to 50 mg daily but still had 75mg  on med list Will do 50mg  daily  History of pulmonary embolism Continue Eliquis CTA chest on 4/9 negative for PE  Hypertension Continue losartan metoprolol  Depression Continue Lexapro  History of gout Continue allopurinol  Dementia Continue Aricept and Seroquel   DVT prophylaxis: Eliquis Code Status: DNR Family Communication: None Disposition Plan: Status is: Inpatient  Remains inpatient appropriate because:Inpatient level of care appropriate due to severity of illness   Dispo: The patient is from: Home              Anticipated d/c is to: Home              Patient currently is not medically stable to d/c.   Difficult to place patient No  Improvement in respiratory failure.  Transition to MedSurg status.  Anticipate discharge home with home health within 24 to 48 hours.     Level of care: Med-Surg  Consultants:   None   Procedures: None  Antimicrobials:   None   Subjective: Patient seen and examined.  Resting in bed.  No visible distress.  Normal work of breathing.  Tolerating diet  Objective: Vitals:   06/10/20 1200 06/10/20 1300 06/10/20 1330 06/10/20 1400  BP: (!) 159/74 (!) 167/85  (!) 158/74  Pulse: (!) 51 (!) 51 66 62  Resp: 17 19 19  (!) 26  Temp: 97.8 F (  36.6 C)     TempSrc:      SpO2: 95% 96% 92% 92%  Weight:      Height:        Intake/Output Summary (Last 24 hours) at 06/10/2020 1455 Last data filed at 06/10/2020 0600 Gross per 24 hour  Intake 600 ml  Output 800 ml  Net -200 ml   Filed Weights   06/07/20 2334 06/08/20 0230  Weight: 81.5 kg 84.3 kg    Examination:   General exam: No acute distress.  Appears  fatigued Respiratory system: Mild bibasilar crackles.  Normal work of breathing.  4 L Cardiovascular system: S1-S2, regular rate and rhythm, no murmurs Gastrointestinal system: Abdomen is nondistended, soft and nontender. No organomegaly or masses felt. Normal bowel sounds heard. Central nervous system: Alert and oriented. No focal neurological deficits. Extremities: Symmetric 5 x 5 power. Skin: No rashes, lesions or ulcers Psychiatry: Judgement and insight appear normal. Mood & affect appropriate.     Data Reviewed: I have personally reviewed following labs and imaging studies  CBC: Recent Labs  Lab 06/07/20 2308 06/08/20 0546 06/10/20 0840  WBC 13.4* 10.3 16.6*  NEUTROABS 9.2*  --  15.7*  HGB 14.2 13.8 14.7  HCT 50.8 49.0 52.1*  MCV 88.8 88.1 88.2  PLT 182 155 202   Basic Metabolic Panel: Recent Labs  Lab 06/07/20 2308 06/08/20 0546 06/10/20 0840  NA 142 141 141  K 4.1 4.7 4.6  CL 95* 94* 89*  CO2 39* 38* 46*  GLUCOSE 122* 164* 125*  BUN 19 20 26*  CREATININE 0.61 0.75 0.61  CALCIUM 9.2 9.1 9.5  MG 2.1  --   --    GFR: Estimated Creatinine Clearance: 77.7 mL/min (by C-G formula based on SCr of 0.61 mg/dL). Liver Function Tests: Recent Labs  Lab 06/07/20 2308  AST 24  ALT 58*  ALKPHOS 83  BILITOT 1.2  PROT 6.5  ALBUMIN 3.6   No results for input(s): LIPASE, AMYLASE in the last 168 hours. No results for input(s): AMMONIA in the last 168 hours. Coagulation Profile: No results for input(s): INR, PROTIME in the last 168 hours. Cardiac Enzymes: No results for input(s): CKTOTAL, CKMB, CKMBINDEX, TROPONINI in the last 168 hours. BNP (last 3 results) No results for input(s): PROBNP in the last 8760 hours. HbA1C: No results for input(s): HGBA1C in the last 72 hours. CBG: Recent Labs  Lab 06/08/20 0222  GLUCAP 154*   Lipid Profile: No results for input(s): CHOL, HDL, LDLCALC, TRIG, CHOLHDL, LDLDIRECT in the last 72 hours. Thyroid Function Tests: No  results for input(s): TSH, T4TOTAL, FREET4, T3FREE, THYROIDAB in the last 72 hours. Anemia Panel: No results for input(s): VITAMINB12, FOLATE, FERRITIN, TIBC, IRON, RETICCTPCT in the last 72 hours. Sepsis Labs: Recent Labs  Lab 06/07/20 2308  PROCALCITON <0.10    Recent Results (from the past 240 hour(s))  Resp Panel by RT-PCR (Flu A&B, Covid) Nasopharyngeal Swab     Status: None   Collection Time: 06/07/20 11:08 PM   Specimen: Nasopharyngeal Swab; Nasopharyngeal(NP) swabs in vial transport medium  Result Value Ref Range Status   SARS Coronavirus 2 by RT PCR NEGATIVE NEGATIVE Final    Comment: (NOTE) SARS-CoV-2 target nucleic acids are NOT DETECTED.  The SARS-CoV-2 RNA is generally detectable in upper respiratory specimens during the acute phase of infection. The lowest concentration of SARS-CoV-2 viral copies this assay can detect is 138 copies/mL. A negative result does not preclude SARS-Cov-2 infection and should not be used as  the sole basis for treatment or other patient management decisions. A negative result may occur with  improper specimen collection/handling, submission of specimen other than nasopharyngeal swab, presence of viral mutation(s) within the areas targeted by this assay, and inadequate number of viral copies(<138 copies/mL). A negative result must be combined with clinical observations, patient history, and epidemiological information. The expected result is Negative.  Fact Sheet for Patients:  BloggerCourse.com  Fact Sheet for Healthcare Providers:  SeriousBroker.it  This test is no t yet approved or cleared by the [place] FDA and  has been authorized for detection and/or diagnosis of SARS-CoV-2 by FDA under an Emergency Use Authorization (EUA). This EUA will remain  in effect (meaning this test can be used) for the duration of the COVID-19 declaration under Section 564(b)(1) of the Act,  21 U.S.C.section 360bbb-3(b)(1), unless the authorization is terminated  or revoked sooner.       Influenza A by PCR NEGATIVE NEGATIVE Final   Influenza B by PCR NEGATIVE NEGATIVE Final    Comment: (NOTE) The Xpert Xpress SARS-CoV-2/FLU/RSV plus assay is intended as an aid in the diagnosis of influenza from Nasopharyngeal swab specimens and should not be used as a sole basis for treatment. Nasal washings and aspirates are unacceptable for Xpert Xpress SARS-CoV-2/FLU/RSV testing.  Fact Sheet for Patients: BloggerCourse.com  Fact Sheet for Healthcare Providers: SeriousBroker.it  This test is not yet approved or cleared by the [place] FDA and has been authorized for detection and/or diagnosis of SARS-CoV-2 by FDA under an Emergency Use Authorization (EUA). This EUA will remain in effect (meaning this test can be used) for the duration of the COVID-19 declaration under Section 564(b)(1) of the Act, 21 U.S.C. section 360bbb-3(b)(1), unless the authorization is terminated or revoked.  Performed at [facility], [address] [place], [place] [address]   MRSA PCR Screening     Status: None   Collection Time: 06/09/20  1:34 AM   Specimen: Nasal Mucosa; Nasopharyngeal  Result Value Ref Range Status   MRSA by PCR NEGATIVE NEGATIVE Final    Comment:        The GeneXpert MRSA Assay (FDA approved for NASAL specimens only), is one component of a comprehensive MRSA colonization surveillance program. It is not intended to diagnose MRSA infection nor to guide or monitor treatment for MRSA infections. Performed at [facility], [address], [place], [place] [address]          Radiology Studies: No results found.      Scheduled Meds: . allopurinol  300 mg Oral Daily  . apixaban  5 mg Oral BID  . arformoterol  15 mcg Nebulization BID  . aspirin EC  81 mg Oral Daily  . atorvastatin  80 mg  Oral Daily  . budesonide (PULMICORT) nebulizer solution  0.25 mg Nebulization BID  . Chlorhexidine Gluconate Cloth  6 each Topical Daily  . escitalopram  20 mg Oral Daily  . folic acid  1 mg Oral Daily  . ipratropium-albuterol  3 mL Nebulization Q6H  . losartan  100 mg Oral Daily  . magnesium oxide  400 mg Oral BID  . methylPREDNISolone (SOLU-MEDROL) injection  40 mg Intravenous Q12H  . metoprolol succinate  50 mg Oral Daily  . QUEtiapine  25 mg Oral QHS  . thiamine  100 mg Oral Daily   Continuous Infusions:   LOS: 2 days    Time spent: 25 minutes    Tresa Moore, MD [facility] Pager  [phone]  If 7PM-7AM, please contact night-coverage 06/10/2020, 2:55 PM

## 2020-06-10 NOTE — Progress Notes (Signed)
Up in chair all afternoon. Moves self in chair when he gets tired. Daughter in to visit . Updated on dad's status. Right eye remains still drooping.

## 2020-06-10 NOTE — Evaluation (Signed)
Physical Therapy Evaluation Patient Details Name: DOCTOR SHEAHAN MRN: [id number] DOB: 05-19-1940 Today's Date: 06/10/2020   History of Present Illness  Pt is a 80 year old male with history of advanced emphysema/COPD/chronic respiratory failure on 3 L, pulmonary embolism on anticoagulation, recent admission for NSTEMI who presents for hypoxia and shortness of breath, placed on bipap, progressed to [place] during the day and NIPPV at night.    Clinical Impression  Pt sleeping on PT arrival, and did intermittent fall back asleep if PT stepped out of room (for socks) or he stopped speaking. Oriented to self, placed, but disoriented to situation and provided PLOF that indicated living alone, chart review states he lives at [facility] of [address]  The patient was able to move all extremities freely against gravity. Supine to sit with HOB elevated and supervision. Good seated balance noted. Sit <> stand with RW and CGA, instructed in hand placement to increase safety. He was able to ambulate ~15ft total with RW and CGA, close chair follow. Pt with normal gait velocity; improved steadiness noted with RW use. Pt cued for activity pacing and one sitting break ~38ft into walk due to desaturation on 4L to spO2 in the 70's, pt reported asymptomatic. Increased to 6L, able to ambulate additional 21ft, mild desaturation noted, but able to return to 90s with cues for PLB and rest. Did seem to have decreased safety awareness. At end of session up in chair, on 4L spO2 >90%.  Overall the patient demonstrated deficits (see "PT Problem List") that impede the patient's functional abilities, safety, and mobility and would benefit from skilled PT intervention. Recommendation is HHPT with intermittent supervision.       Follow Up Recommendations Home health PT;Supervision - Intermittent    Equipment Recommendations  Rolling walker with 5" wheels    Recommendations for Other Services       Precautions / Restrictions  Precautions Precautions: Fall Restrictions Weight Bearing Restrictions: No      Mobility  Bed Mobility Overal bed mobility: Needs Assistance Bed Mobility: Supine to Sit     Supine to sit: Supervision;HOB elevated          Transfers Overall transfer level: Needs assistance Equipment used: Rolling walker (2 wheeled) Transfers: Sit to/from Stand Sit to Stand: Min guard         General transfer comment: cued for hand placement, no assist to physically stand  Ambulation/Gait Ambulation/Gait assistance: Min guard Gait Distance (Feet): 100 Feet Assistive device: Rolling walker (2 wheeled)       General Gait Details: pt with normal gait velocity; improved steadiness noted with RW use. Pt cued for activity pacing and one sitting break ~70ft into walk due to desaturation on 4L. Increased to 6L, able to ambulate additional 24ft.  Stairs            Wheelchair Mobility    Modified Rankin (Stroke Patients Only)       Balance Overall balance assessment: Needs assistance Sitting-balance support: Feet supported Sitting balance-Leahy Scale: Good     Standing balance support: Bilateral upper extremity supported Standing balance-Leahy Scale: Fair Standing balance comment: use of RW withy dynamic tasks, ambulation                             Pertinent Vitals/Pain Pain Assessment: No/denies pain    Home Living Family/patient expects to be discharged to:: Assisted living  Home Equipment: None Additional Comments: Pt is a questionable historian. Pt reports living alone in a 1-story home with walk-in-shower, however chart review reveals that pt is a resident at [facility].    Prior Function Level of Independence: Independent         Comments: Pt reports being indep with ADLs, household mobilization without assist device; per chart, home O2 at 3L (patient denies).  Denies fall history.     Hand Dominance   Dominant Hand:  Left    Extremity/Trunk Assessment   Upper Extremity Assessment Upper Extremity Assessment: Overall WFL for tasks assessed    Lower Extremity Assessment Lower Extremity Assessment: Generalized weakness (able to lift and move each extremity against gravity)    Cervical / Trunk Assessment Cervical / Trunk Assessment: Normal  Communication   Communication: No difficulties  Cognition Arousal/Alertness: Awake/alert Behavior During Therapy: WFL for tasks assessed/performed Overall Cognitive Status: No family/caregiver present to determine baseline cognitive functioning                                 General Comments: Pt alert and oriented to self and is aware that he is in a hospital. Disoriented to situation and PLOF is differing from social history listed in chart      General Comments      Exercises     Assessment/Plan    PT Assessment Patient needs continued PT services  PT Problem List Decreased activity tolerance;Decreased balance;Decreased mobility;Cardiopulmonary status limiting activity;Decreased cognition;Decreased coordination;Decreased knowledge of precautions;Decreased safety awareness;Decreased knowledge of use of DME       PT Treatment Interventions DME instruction;Gait training;Functional mobility training;Therapeutic activities;Therapeutic exercise;Balance training;Patient/family education    PT Goals (Current goals can be found in the Care Plan section)  Acute Rehab PT Goals Patient Stated Goal: to get back home PT Goal Formulation: With patient Time For Goal Achievement: 06/24/20 Potential to Achieve Goals: Good    Frequency Min 2X/week   Barriers to discharge        Co-evaluation               AM-PAC PT "6 Clicks" Mobility  Outcome Measure Help needed turning from your back to your side while in a flat bed without using bedrails?: None Help needed moving from lying on your back to sitting on the side of a flat bed without  using bedrails?: None Help needed moving to and from a bed to a chair (including a wheelchair)?: A Little Help needed standing up from a chair using your arms (e.g., wheelchair or bedside chair)?: A Little Help needed to walk in hospital room?: A Little Help needed climbing 3-5 steps with a railing? : A Little 6 Click Score: 20    End of Session Equipment Utilized During Treatment: Gait belt;Oxygen Activity Tolerance: Patient tolerated treatment well Patient left: in chair;with call bell/phone within reach;with chair alarm set Nurse Communication: Mobility status PT Visit Diagnosis: Muscle weakness (generalized) (M62.81);Difficulty in walking, not elsewhere classified (R26.2)    Time: 0258-5277 PT Time Calculation (min) (ACUTE ONLY): 32 min   Charges:   PT Evaluation $PT Eval Moderate Complexity: 1 Mod PT Treatments $Therapeutic Exercise: 23-37 mins        Olga Coaster PT, DPT 1:19 PM,06/10/20

## 2020-06-11 DIAGNOSIS — J9621 Acute and chronic respiratory failure with hypoxia: Secondary | ICD-10-CM | POA: Diagnosis not present

## 2020-06-11 DIAGNOSIS — J9622 Acute and chronic respiratory failure with hypercapnia: Secondary | ICD-10-CM | POA: Diagnosis not present

## 2020-06-11 LAB — CBC WITH DIFFERENTIAL/PLATELET
Abs Immature Granulocytes: 0.07 10*3/uL (ref 0.00–0.07)
Basophils Absolute: 0 10*3/uL (ref 0.0–0.1)
Basophils Relative: 0 %
Eosinophils Absolute: 0 10*3/uL (ref 0.0–0.5)
Eosinophils Relative: 0 %
HCT: 51.9 % (ref 39.0–52.0)
Hemoglobin: 14.7 g/dL (ref 13.0–17.0)
Immature Granulocytes: 1 %
Lymphocytes Relative: 6 %
Lymphs Abs: 0.8 10*3/uL (ref 0.7–4.0)
MCH: 24.9 pg — ABNORMAL LOW (ref 26.0–34.0)
MCHC: 28.3 g/dL — ABNORMAL LOW (ref 30.0–36.0)
MCV: 88 fL (ref 80.0–100.0)
Monocytes Absolute: 0.4 10*3/uL (ref 0.1–1.0)
Monocytes Relative: 3 %
Neutro Abs: 12.2 10*3/uL — ABNORMAL HIGH (ref 1.7–7.7)
Neutrophils Relative %: 90 %
Platelets: 200 10*3/uL (ref 150–400)
RBC: 5.9 MIL/uL — ABNORMAL HIGH (ref 4.22–5.81)
RDW: 16.1 % — ABNORMAL HIGH (ref 11.5–15.5)
WBC: 13.4 10*3/uL — ABNORMAL HIGH (ref 4.0–10.5)
nRBC: 0 % (ref 0.0–0.2)

## 2020-06-11 LAB — BASIC METABOLIC PANEL
Anion gap: 7 (ref 5–15)
BUN: 29 mg/dL — ABNORMAL HIGH (ref 8–23)
CO2: 46 mmol/L — ABNORMAL HIGH (ref 22–32)
Calcium: 9.7 mg/dL (ref 8.9–10.3)
Chloride: 87 mmol/L — ABNORMAL LOW (ref 98–111)
Creatinine, Ser: 0.62 mg/dL (ref 0.61–1.24)
GFR, Estimated: >60 mL/min (ref >60)
Glucose, Bld: 110 mg/dL — ABNORMAL HIGH (ref 70–99)
Potassium: 4.8 mmol/L (ref 3.5–5.1)
Sodium: 140 mmol/L (ref 135–145)

## 2020-06-11 NOTE — Progress Notes (Signed)
PROGRESS NOTE    CAROLINE MATTERS  XNA:[id number] DOB: 24-Jan-1941 DOA: 06/07/2020 PCP: Patient, No Pcp Per (Inactive)   Brief Narrative:  80 year old male with history of advanced emphysema/COPD/chronic respiratory failure on 3 L, pulmonary embolism on anticoagulation, recent admission for NSTEMI who presents for hypoxia and shortness of breath.  Patient has advanced emphysema and has been dependent on BiPAP for the majority of his hospital stay.  He also has elements of congestive heart failure.  He was started on aggressive COPD and CHF regimen.  Clinical status has been improving.  Patient is on 3 to 4 L during the day and NIPPV at night.  Seems to be tolerating this well.  Making good urine.  Mental status also improving.  4/26: Weaned to 4 l.  Patient himself denies use of home oxygen however per documentation is on 3 L chronically   Assessment & Plan:   Principal Problem:   Acute on chronic respiratory failure with hypoxia and hypercapnia (HCC) Active Problems:   Bradycardia   History of pulmonary embolism   HTN (hypertension)   Depression   Dementia with behavioral disturbance (HCC)   Gout   DNR (do not resuscitate)  Acute on chronic hypoxic and hypercapnic respiratory failure from worsening chronic lung disease Pt chronically on 3 L and admitted on BiPAP VBG with pH of 7.3, CO2 97, bicarb 48 Respiratory status improving Developing metabolic alkalosis, suspect contraction Plan: Continue multimodal nebulizer regimen DuoNebs every 6 hours Brovana twice daily Pulmicort twice daily Continue IV Solu-Medrol for today, 40 mg every 12 hours I suspect we can transition to p.o. prednisone tomorrow Continue supplemental oxygen as needed Goal oxygen saturation 88-92% Continue therapy evaluations Possible discharge in 24 to 48 hours respiratory status continues to improve  Acute on chronic diastolic congestive heart failure -CXR shows pulmonary edema -BNP of 429 has been  progressively increasing from last two weeks -had possible NSTEMI earlier this year but no invasive intervention due to comorbidies and was medical managment per cardiology  -Last echocardiogram in February showed EF of 60 to 65% -Repeat echocardiogram normal EF with grade 2 diastolic dysfunction -Bicarb increasing, suspect contraction alkalosis.  Patient appears dry Plan: Diuretics on hold Strict I's and O's Daily weights Cardiac diet  Bradycardia Asymptomatic.  Heart rate in the range of 50s. Previously documented to have metoprolol decreased from 75 to 50 mg daily but still had 75mg  on med list Plan: Continue metoprolol 50 mg daily  History of pulmonary embolism Continue Eliquis CTA chest on 4/9 negative for PE  Hypertension Continue losartan metoprolol  Depression Continue Lexapro  History of gout Continue allopurinol  Dementia Continue Aricept and Seroquel   DVT prophylaxis: Eliquis Code Status: DNR Family Communication: None.  Offered to call but patient declined Disposition Plan: Status is: Inpatient  Remains inpatient appropriate because:Inpatient level of care appropriate due to severity of illness   Dispo: The patient is from: Home              Anticipated d/c is to: Home              Patient currently is not medically stable to d/c.   Difficult to place patient No  Respiratory status continues to improve.  Continue MedSurg status for now.  Home health ordered.  Anticipate medical readiness for discharge in 24 to 48 hours.     Level of care: Med-Surg  Consultants:   None   Procedures: None  Antimicrobials:   None   Subjective:  Patient seen and examined.  Sitting comfortably in bed.  No visible distress.  Normal work of breathing.  Tolerating p.o. intake.  4 L  Objective: Vitals:   06/11/20 0500 06/11/20 0524 06/11/20 0738 06/11/20 0818  BP:  140/65 138/90   Pulse:  (!) 52 (!) 56   Resp:  16 20   Temp:  (!) 97.4 F (36.3 C)  97.6 F (36.4 C)   TempSrc:  Oral    SpO2:  99% 98% 99%  Weight: 82.5 kg     Height:        Intake/Output Summary (Last 24 hours) at 06/11/2020 1047 Last data filed at 06/11/2020 1000 Gross per 24 hour  Intake 240 ml  Output 700 ml  Net -460 ml   Filed Weights   06/07/20 2334 06/08/20 0230 06/11/20 0500  Weight: 81.5 kg 84.3 kg 82.5 kg    Examination:   General exam: No acute distress.  Appears fatigued Respiratory system: Mild bibasilar crackles.  Normal work of breathing.  4 L  cardiovascular system: S1-S2 heard, regular rate and rhythm, no murmurs Gastrointestinal system: Abdomen is nondistended, soft and nontender. No organomegaly or masses felt. Normal bowel sounds heard. Central nervous system: Alert and oriented. No focal neurological deficits. Extremities: Symmetric 5 x 5 power. Skin: No rashes, lesions or ulcers Psychiatry: Judgement and insight appear normal. Mood & affect appropriate.     Data Reviewed: I have personally reviewed following labs and imaging studies  CBC: Recent Labs  Lab 06/07/20 2308 06/08/20 0546 06/10/20 0840 06/11/20 0730  WBC 13.4* 10.3 16.6* 13.4*  NEUTROABS 9.2*  --  15.7* 12.2*  HGB 14.2 13.8 14.7 14.7  HCT 50.8 49.0 52.1* 51.9  MCV 88.8 88.1 88.2 88.0  PLT 182 155 202 200   Basic Metabolic Panel: Recent Labs  Lab 06/07/20 2308 06/08/20 0546 06/10/20 0840 06/11/20 0730  NA 142 141 141 140  K 4.1 4.7 4.6 4.8  CL 95* 94* 89* 87*  CO2 39* 38* 46* 46*  GLUCOSE 122* 164* 125* 110*  BUN 19 20 26* 29*  CREATININE 0.61 0.75 0.61 0.62  CALCIUM 9.2 9.1 9.5 9.7  MG 2.1  --   --   --    GFR: Estimated Creatinine Clearance: 77 mL/min (by C-G formula based on SCr of 0.62 mg/dL). Liver Function Tests: Recent Labs  Lab 06/07/20 2308  AST 24  ALT 58*  ALKPHOS 83  BILITOT 1.2  PROT 6.5  ALBUMIN 3.6   No results for input(s): LIPASE, AMYLASE in the last 168 hours. No results for input(s): AMMONIA in the last 168  hours. Coagulation Profile: No results for input(s): INR, PROTIME in the last 168 hours. Cardiac Enzymes: No results for input(s): CKTOTAL, CKMB, CKMBINDEX, TROPONINI in the last 168 hours. BNP (last 3 results) No results for input(s): PROBNP in the last 8760 hours. HbA1C: No results for input(s): HGBA1C in the last 72 hours. CBG: Recent Labs  Lab 06/08/20 0222  GLUCAP 154*   Lipid Profile: No results for input(s): CHOL, HDL, LDLCALC, TRIG, CHOLHDL, LDLDIRECT in the last 72 hours. Thyroid Function Tests: No results for input(s): TSH, T4TOTAL, FREET4, T3FREE, THYROIDAB in the last 72 hours. Anemia Panel: No results for input(s): VITAMINB12, FOLATE, FERRITIN, TIBC, IRON, RETICCTPCT in the last 72 hours. Sepsis Labs: Recent Labs  Lab 06/07/20 2308  PROCALCITON <0.10    Recent Results (from the past 240 hour(s))  Resp Panel by RT-PCR (Flu A&B, Covid) Nasopharyngeal Swab  Status: None   Collection Time: 06/07/20 11:08 PM   Specimen: Nasopharyngeal Swab; Nasopharyngeal(NP) swabs in vial transport medium  Result Value Ref Range Status   SARS Coronavirus 2 by RT PCR NEGATIVE NEGATIVE Final    Comment: (NOTE) SARS-CoV-2 target nucleic acids are NOT DETECTED.  The SARS-CoV-2 RNA is generally detectable in upper respiratory specimens during the acute phase of infection. The lowest concentration of SARS-CoV-2 viral copies this assay can detect is 138 copies/mL. A negative result does not preclude SARS-Cov-2 infection and should not be used as the sole basis for treatment or other patient management decisions. A negative result may occur with  improper specimen collection/handling, submission of specimen other than nasopharyngeal swab, presence of viral mutation(s) within the areas targeted by this assay, and inadequate number of viral copies(<138 copies/mL). A negative result must be combined with clinical observations, patient history, and epidemiological information. The  expected result is Negative.  Fact Sheet for Patients:  BloggerCourse.com  Fact Sheet for Healthcare Providers:  SeriousBroker.it  This test is no t yet approved or cleared by the [place] FDA and  has been authorized for detection and/or diagnosis of SARS-CoV-2 by FDA under an Emergency Use Authorization (EUA). This EUA will remain  in effect (meaning this test can be used) for the duration of the COVID-19 declaration under Section 564(b)(1) of the Act, 21 U.S.C.section 360bbb-3(b)(1), unless the authorization is terminated  or revoked sooner.       Influenza A by PCR NEGATIVE NEGATIVE Final   Influenza B by PCR NEGATIVE NEGATIVE Final    Comment: (NOTE) The Xpert Xpress SARS-CoV-2/FLU/RSV plus assay is intended as an aid in the diagnosis of influenza from Nasopharyngeal swab specimens and should not be used as a sole basis for treatment. Nasal washings and aspirates are unacceptable for Xpert Xpress SARS-CoV-2/FLU/RSV testing.  Fact Sheet for Patients: BloggerCourse.com  Fact Sheet for Healthcare Providers: SeriousBroker.it  This test is not yet approved or cleared by the [place] FDA and has been authorized for detection and/or diagnosis of SARS-CoV-2 by FDA under an Emergency Use Authorization (EUA). This EUA will remain in effect (meaning this test can be used) for the duration of the COVID-19 declaration under Section 564(b)(1) of the Act, 21 U.S.C. section 360bbb-3(b)(1), unless the authorization is terminated or revoked.  Performed at [facility], [address] [place], [place] [address]   MRSA PCR Screening     Status: None   Collection Time: 06/09/20  1:34 AM   Specimen: Nasal Mucosa; Nasopharyngeal  Result Value Ref Range Status   MRSA by PCR NEGATIVE NEGATIVE Final    Comment:        The GeneXpert MRSA Assay (FDA approved for NASAL  specimens only), is one component of a comprehensive MRSA colonization surveillance program. It is not intended to diagnose MRSA infection nor to guide or monitor treatment for MRSA infections. Performed at [facility], [address], [place], [place] [address]          Radiology Studies: No results found.      Scheduled Meds: . allopurinol  300 mg Oral Daily  . apixaban  5 mg Oral BID  . arformoterol  15 mcg Nebulization BID  . aspirin EC  81 mg Oral Daily  . atorvastatin  80 mg Oral Daily  . budesonide (PULMICORT) nebulizer solution  0.25 mg Nebulization BID  . Chlorhexidine Gluconate Cloth  6 each Topical Daily  . escitalopram  20 mg Oral Daily  .  folic acid  1 mg Oral Daily  . ipratropium-albuterol  3 mL Nebulization Q6H  . losartan  100 mg Oral Daily  . magnesium oxide  400 mg Oral BID  . methylPREDNISolone (SOLU-MEDROL) injection  40 mg Intravenous Q12H  . metoprolol succinate  50 mg Oral Daily  . QUEtiapine  25 mg Oral QHS  . thiamine  100 mg Oral Daily   Continuous Infusions:   LOS: 3 days    Time spent: 25 minutes    Tresa MooreSudheer B Eden Toohey, MD [facility] Pager [phone]  If 7PM-7AM, please contact night-coverage 06/11/2020, 10:47 AM

## 2020-06-11 NOTE — Plan of Care (Signed)

## 2020-06-12 DIAGNOSIS — J9622 Acute and chronic respiratory failure with hypercapnia: Secondary | ICD-10-CM | POA: Diagnosis not present

## 2020-06-12 DIAGNOSIS — J9621 Acute and chronic respiratory failure with hypoxia: Secondary | ICD-10-CM | POA: Diagnosis not present

## 2020-06-12 LAB — BASIC METABOLIC PANEL
Anion gap: 10 (ref 5–15)
BUN: 31 mg/dL — ABNORMAL HIGH (ref 8–23)
CO2: 43 mmol/L — ABNORMAL HIGH (ref 22–32)
Calcium: 9.6 mg/dL (ref 8.9–10.3)
Chloride: 88 mmol/L — ABNORMAL LOW (ref 98–111)
Creatinine, Ser: 0.63 mg/dL (ref 0.61–1.24)
GFR, Estimated: >60 mL/min (ref >60)
Glucose, Bld: 118 mg/dL — ABNORMAL HIGH (ref 70–99)
Potassium: 4.2 mmol/L (ref 3.5–5.1)
Sodium: 141 mmol/L (ref 135–145)

## 2020-06-12 LAB — GLUCOSE, CAPILLARY: Glucose-Capillary: 147 mg/dL — ABNORMAL HIGH (ref 70–99)

## 2020-06-12 LAB — CBC WITH DIFFERENTIAL/PLATELET
Abs Immature Granulocytes: 0.06 10*3/uL (ref 0.00–0.07)
Basophils Absolute: 0 10*3/uL (ref 0.0–0.1)
Basophils Relative: 0 %
Eosinophils Absolute: 0 10*3/uL (ref 0.0–0.5)
Eosinophils Relative: 0 %
HCT: 51.1 % (ref 39.0–52.0)
Hemoglobin: 14.5 g/dL (ref 13.0–17.0)
Immature Granulocytes: 1 %
Lymphocytes Relative: 5 %
Lymphs Abs: 0.6 10*3/uL — ABNORMAL LOW (ref 0.7–4.0)
MCH: 25 pg — ABNORMAL LOW (ref 26.0–34.0)
MCHC: 28.4 g/dL — ABNORMAL LOW (ref 30.0–36.0)
MCV: 88.3 fL (ref 80.0–100.0)
Monocytes Absolute: 0.3 10*3/uL (ref 0.1–1.0)
Monocytes Relative: 2 %
Neutro Abs: 10.7 10*3/uL — ABNORMAL HIGH (ref 1.7–7.7)
Neutrophils Relative %: 92 %
Platelets: 180 10*3/uL (ref 150–400)
RBC: 5.79 MIL/uL (ref 4.22–5.81)
RDW: 16.1 % — ABNORMAL HIGH (ref 11.5–15.5)
WBC: 11.6 10*3/uL — ABNORMAL HIGH (ref 4.0–10.5)
nRBC: 0 % (ref 0.0–0.2)

## 2020-06-12 NOTE — Care Management Important Message (Signed)
Important Message  Patient Details  Name: Melvin Campbell MRN: [id number] Date of Birth: 07/12/1940   Medicare Important Message Given:  Yes  Point of contact for this patient is his daughter, Damel Querry. I talked with her by phone [phone]) to review the Important Message from Medicare.  She is in agreement with the discharge plan and asked if she would like another copy of the form. Stated she had 4 copies with her and did not need another copy.  I thanked her for her time.   Olegario Messier A Jaelin Fackler 06/12/2020, 3:08 PM

## 2020-06-12 NOTE — Progress Notes (Signed)
PROGRESS NOTE    Melvin Campbell  TKW:[id number] DOB: 11-Jan-1941 DOA: 06/07/2020 PCP: Patient, No Pcp Per (Inactive)   Brief Narrative:  79 year old male with history of advanced emphysema/COPD/chronic respiratory failure on 3 L, pulmonary embolism on anticoagulation, recent admission for NSTEMI who presents for hypoxia and shortness of breath.  Patient has advanced emphysema and has been dependent on BiPAP for the majority of his hospital stay.  He also has elements of congestive heart failure.  He was started on aggressive COPD and CHF regimen.  Clinical status has been improving.  Patient is on 3 to 4 L during the day and NIPPV at night.  Seems to be tolerating this well.  Making good urine.  Mental status also improving.  4/26: Weaned to 4 l.  Patient himself denies use of home oxygen however per documentation is on 3 L chronically   Assessment & Plan:   Principal Problem:   Acute on chronic respiratory failure with hypoxia and hypercapnia (HCC) Active Problems:   Bradycardia   History of pulmonary embolism   HTN (hypertension)   Depression   Dementia with behavioral disturbance (HCC)   Gout   DNR (do not resuscitate)  Acute on chronic hypoxic and hypercapnic respiratory failure from worsening chronic lung disease Pt chronically on 3 L and admitted on BiPAP VBG with pH of 7.3, CO2 97, bicarb 48 Respiratory status improving Plan: Continue multimodal nebulizer regimen DuoNebs every 6 hours Brovana twice daily Pulmicort twice daily Continue IV Solu-Medrol 40 mg every 12 hours. --can transition to p.o. prednisone tomorrow --pulm consult today  --likely will need BiPAP at home  Acute on chronic diastolic congestive heart failure -CXR shows pulmonary edema -BNP of 429 has been progressively increasing from last two weeks -had possible NSTEMI earlier this year but no invasive intervention due to comorbidies and was medical managment per cardiology  -Last echocardiogram  in February showed EF of 60 to 65% -Repeat echocardiogram normal EF with grade 2 diastolic dysfunction -Bicarb increasing, suspect contraction alkalosis.  Pt received a few days of IV lasix Plan: --Hold diuretic for now  Bradycardia Asymptomatic.  Heart rate in the range of 50s. Previously documented to have metoprolol decreased from 75 to 50 mg daily but still had 75mg  on med list Plan: --cont Toprol 50 mg daily  History of pulmonary embolism CTA chest on 4/9 negative for PE --cont home Eliquis  Hypertension --cont losartan and Toprol  Depression Continue Lexapro  History of gout Continue allopurinol  Dementia Continue Seroquel Home Aricept on hold   DVT prophylaxis: Eliquis Code Status: DNR Family Communication:  Disposition Plan: Status is: Inpatient  Remains inpatient appropriate because:Inpatient level of care appropriate due to severity of illness   Dispo: The patient is from: ALF              Anticipated d/c is to: ALF              Patient currently is not medically stable to d/c.  Ordered pulm consult today since pt has had 2 admissions in this month for the same respiratory issues.    Difficult to place patient No   Level of care: Med-Surg  Consultants:   None   Procedures: None  Antimicrobials:   None   Subjective: Pt reported seeing ghosts of people he had known just passing by in his room.  Pt was not stress by them.  Reported having trouble getting enough air.   Objective: Vitals:   06/12/20 0038 06/12/20  0500 06/12/20 0513 06/12/20 0720  BP: (!) 152/62  (!) 161/76 (!) 166/92  Pulse: 60  (!) 47 (!) 56  Resp: 18  20 20   Temp: 98.3 F (36.8 C)  98.1 F (36.7 C) 97.7 F (36.5 C)  TempSrc: Oral  Oral   SpO2: 96%  93% 95%  Weight:  82.8 kg    Height:        Intake/Output Summary (Last 24 hours) at 06/12/2020 1740 Last data filed at 06/12/2020 1444 Gross per 24 hour  Intake 0 ml  Output 950 ml  Net -950 ml   Filed Weights    06/08/20 0230 06/11/20 0500 06/12/20 0500  Weight: 84.3 kg 82.5 kg 82.8 kg    Examination:   Constitutional: NAD, alert, oriented to person and place, sitting up in chair HEENT: conjunctivae and lids normal, EOMI CV: No cyanosis.   RESP: normal respiratory effort, clear, on 2L Extremities: chronic venous stasis changes in BLE SKIN: warm, dry Neuro: II - XII grossly intact.   Psych: Normal mood and affect.     Data Reviewed: I have personally reviewed following labs and imaging studies  CBC: Recent Labs  Lab 06/07/20 2308 06/08/20 0546 06/10/20 0840 06/11/20 0730 06/12/20 0504  WBC 13.4* 10.3 16.6* 13.4* 11.6*  NEUTROABS 9.2*  --  15.7* 12.2* 10.7*  HGB 14.2 13.8 14.7 14.7 14.5  HCT 50.8 49.0 52.1* 51.9 51.1  MCV 88.8 88.1 88.2 88.0 88.3  PLT 182 155 202 200 180   Basic Metabolic Panel: Recent Labs  Lab 06/07/20 2308 06/08/20 0546 06/10/20 0840 06/11/20 0730 06/12/20 0504  NA 142 141 141 140 141  K 4.1 4.7 4.6 4.8 4.2  CL 95* 94* 89* 87* 88*  CO2 39* 38* 46* 46* 43*  GLUCOSE 122* 164* 125* 110* 118*  BUN 19 20 26* 29* 31*  CREATININE 0.61 0.75 0.61 0.62 0.63  CALCIUM 9.2 9.1 9.5 9.7 9.6  MG 2.1  --   --   --   --    GFR: Estimated Creatinine Clearance: 77.1 mL/min (by C-G formula based on SCr of 0.63 mg/dL). Liver Function Tests: Recent Labs  Lab 06/07/20 2308  AST 24  ALT 58*  ALKPHOS 83  BILITOT 1.2  PROT 6.5  ALBUMIN 3.6   No results for input(s): LIPASE, AMYLASE in the last 168 hours. No results for input(s): AMMONIA in the last 168 hours. Coagulation Profile: No results for input(s): INR, PROTIME in the last 168 hours. Cardiac Enzymes: No results for input(s): CKTOTAL, CKMB, CKMBINDEX, TROPONINI in the last 168 hours. BNP (last 3 results) No results for input(s): PROBNP in the last 8760 hours. HbA1C: No results for input(s): HGBA1C in the last 72 hours. CBG: Recent Labs  Lab 06/08/20 0222 06/12/20 1211  GLUCAP 154* 147*   Lipid  Profile: No results for input(s): CHOL, HDL, LDLCALC, TRIG, CHOLHDL, LDLDIRECT in the last 72 hours. Thyroid Function Tests: No results for input(s): TSH, T4TOTAL, FREET4, T3FREE, THYROIDAB in the last 72 hours. Anemia Panel: No results for input(s): VITAMINB12, FOLATE, FERRITIN, TIBC, IRON, RETICCTPCT in the last 72 hours. Sepsis Labs: Recent Labs  Lab 06/07/20 2308  PROCALCITON <0.10    Recent Results (from the past 240 hour(s))  Resp Panel by RT-PCR (Flu A&B, Covid) Nasopharyngeal Swab     Status: None   Collection Time: 06/07/20 11:08 PM   Specimen: Nasopharyngeal Swab; Nasopharyngeal(NP) swabs in vial transport medium  Result Value Ref Range Status   SARS Coronavirus 2 by  RT PCR NEGATIVE NEGATIVE Final    Comment: (NOTE) SARS-CoV-2 target nucleic acids are NOT DETECTED.  The SARS-CoV-2 RNA is generally detectable in upper respiratory specimens during the acute phase of infection. The lowest concentration of SARS-CoV-2 viral copies this assay can detect is 138 copies/mL. A negative result does not preclude SARS-Cov-2 infection and should not be used as the sole basis for treatment or other patient management decisions. A negative result may occur with  improper specimen collection/handling, submission of specimen other than nasopharyngeal swab, presence of viral mutation(s) within the areas targeted by this assay, and inadequate number of viral copies(<138 copies/mL). A negative result must be combined with clinical observations, patient history, and epidemiological information. The expected result is Negative.  Fact Sheet for Patients:  BloggerCourse.comhttps://www.fda.gov/media/152166/download  Fact Sheet for Healthcare Providers:  SeriousBroker.ithttps://www.fda.gov/media/152162/download  This test is no t yet approved or cleared by the [place] FDA and  has been authorized for detection and/or diagnosis of SARS-CoV-2 by FDA under an Emergency Use Authorization (EUA). This EUA will remain  in  effect (meaning this test can be used) for the duration of the COVID-19 declaration under Section 564(b)(1) of the Act, 21 U.S.C.section 360bbb-3(b)(1), unless the authorization is terminated  or revoked sooner.       Influenza A by PCR NEGATIVE NEGATIVE Final   Influenza B by PCR NEGATIVE NEGATIVE Final    Comment: (NOTE) The Xpert Xpress SARS-CoV-2/FLU/RSV plus assay is intended as an aid in the diagnosis of influenza from Nasopharyngeal swab specimens and should not be used as a sole basis for treatment. Nasal washings and aspirates are unacceptable for Xpert Xpress SARS-CoV-2/FLU/RSV testing.  Fact Sheet for Patients: BloggerCourse.comhttps://www.fda.gov/media/152166/download  Fact Sheet for Healthcare Providers: SeriousBroker.ithttps://www.fda.gov/media/152162/download  This test is not yet approved or cleared by the [place] FDA and has been authorized for detection and/or diagnosis of SARS-CoV-2 by FDA under an Emergency Use Authorization (EUA). This EUA will remain in effect (meaning this test can be used) for the duration of the COVID-19 declaration under Section 564(b)(1) of the Act, 21 U.S.C. section 360bbb-3(b)(1), unless the authorization is terminated or revoked.  Performed at [facility], [address] [place], [place] [address]   MRSA PCR Screening     Status: None   Collection Time: 06/09/20  1:34 AM   Specimen: Nasal Mucosa; Nasopharyngeal  Result Value Ref Range Status   MRSA by PCR NEGATIVE NEGATIVE Final    Comment:        The GeneXpert MRSA Assay (FDA approved for NASAL specimens only), is one component of a comprehensive MRSA colonization surveillance program. It is not intended to diagnose MRSA infection nor to guide or monitor treatment for MRSA infections. Performed at [facility], [address] [place], [place] [address]        Radiology Studies: No results found.    Scheduled Meds: . allopurinol  300 mg Oral Daily  . apixaban  5  mg Oral BID  . arformoterol  15 mcg Nebulization BID  . aspirin EC  81 mg Oral Daily  . atorvastatin  80 mg Oral Daily  . budesonide (PULMICORT) nebulizer solution  0.25 mg Nebulization BID  . Chlorhexidine Gluconate Cloth  6 each Topical Daily  . escitalopram  20 mg Oral Daily  . folic acid  1 mg Oral Daily  . ipratropium-albuterol  3 mL Nebulization Q6H  . losartan  100 mg Oral Daily  . magnesium oxide  400 mg Oral BID  . methylPREDNISolone (  SOLU-MEDROL) injection  40 mg Intravenous Q12H  . metoprolol succinate  50 mg Oral Daily  . QUEtiapine  25 mg Oral QHS  . thiamine  100 mg Oral Daily   Continuous Infusions:   LOS: 4 days     Darlin Priestly, MD [facility] Pager [phone]  If 7PM-7AM, please contact night-coverage 06/12/2020, 5:40 PM

## 2020-06-12 NOTE — TOC Progression Note (Signed)
Transition of Care [facility]) - Progression Note    Patient Details  Name: Melvin Campbell MRN: [id number] Date of Birth: [birth year]  Transition of Care [facility] CM/SW Contact  Caryn Section, RN Phone Number: 06/12/2020, 11:00 AM  Clinical Narrative:   TOC in to see patient.  Patient remains confused, stating his heart monitor wires are 'suspicious'.  Contacted ALF, [facility], states he can come back to them upon discharge, with oxygen and state of confusion is "normal" for patient.  TOC will speak with daughter upon her arrival today, TOC contact information given, will follow through discharge.    Expected Discharge Plan: Long Term Nursing Home Barriers to Discharge: Continued Medical Work up  Expected Discharge Plan and Services Expected Discharge Plan: Long Term Nursing Home In-house Referral: Clinical Social Work   Post Acute Care Choice: Home Health ([facility]) Living arrangements for the past 2 months: Assisted Living Facility ([facility])                                       Social Determinants of Health (SDOH) Interventions    Readmission Risk Interventions Readmission Risk Prevention Plan 06/12/2020  Transportation Screening (No Data)  PCP or Specialist Appt within 3-5 Days (No Data)  HRI or Home Care Consult (No Data)  Social Work Consult for Recovery Care Planning/Counseling Not Complete  SW consult not completed comments RNCM assisgned to patient  Palliative Care Screening Not Applicable  Medication Review Oceanographer) Complete

## 2020-06-12 NOTE — TOC Progression Note (Signed)
Transition of Care [facility]) - Progression Note    Patient Details  Name: Melvin Campbell MRN: [id number] Date of Birth: [birth year]  Transition of Care [facility]) CM/SW Contact  Caryn Section, RN Phone Number: 06/12/2020, 4:10 PM  Clinical Narrative:   MD ordered Bipap for patient.  Zac at [employer] notified, will contact TOC following set up.    Expected Discharge Plan: Long Term Nursing Home Barriers to Discharge: Continued Medical Work up  Expected Discharge Plan and Services Expected Discharge Plan: Long Term Nursing Home In-house Referral: Clinical Social Work   Post Acute Care Choice: Home Health ([facility]) Living arrangements for the past 2 months: Assisted Living Facility ([facility])                                       Social Determinants of Health (SDOH) Interventions    Readmission Risk Interventions Readmission Risk Prevention Plan 06/12/2020  Transportation Screening (No Data)  PCP or Specialist Appt within 3-5 Days (No Data)  HRI or Home Care Consult (No Data)  Social Work Consult for Recovery Care Planning/Counseling Not Complete  SW consult not completed comments RNCM assisgned to patient  Palliative Care Screening Not Applicable  Medication Review Oceanographer) Complete

## 2020-06-12 NOTE — Progress Notes (Signed)
Physical Therapy Treatment Patient Details Name: Melvin Campbell MRN: [id number] DOB: 28-Nov-1940 Today's Date: 06/12/2020    History of Present Illness Pt is a 80 year old male with history of advanced emphysema/COPD/chronic respiratory failure on 3 L, pulmonary embolism on anticoagulation, recent admission for NSTEMI who presents for hypoxia and shortness of breath, placed on bipap, progressed to [place] during the day and NIPPV at night.    PT Comments    Pt on 3L O2 sitting up in chair, O2 sats at 93%.  Discussed POC, pt agreeable to PT.  Reviewed strengthening exercises and educated pt on pursed lip breathing technique.  Pt able to transfer sit<>stand with supervision and good initial standing balance.  Gait training 116ft while pushing O2 tank and CG/SBA for safety and vc's for pacing.  Pt desated to 85%, able to recover after 2 minute rest break.  Pt will benefit from HHPT upon returning home and education on O2 safety and management since he states he has never used oxygen before.    Follow Up Recommendations  Home health PT;Supervision - Intermittent     Equipment Recommendations  Rolling walker with 5" wheels    Recommendations for Other Services       Precautions / Restrictions Precautions Precautions: Fall Restrictions Weight Bearing Restrictions: No    Mobility  Bed Mobility Overal bed mobility: Needs Assistance Bed Mobility: Supine to Sit     Supine to sit: Supervision;HOB elevated     General bed mobility comments: pt received and left up in chair    Transfers Overall transfer level: Needs assistance   Transfers: Sit to/from Stand Sit to Stand: Supervision            Ambulation/Gait Ambulation/Gait assistance: Min guard;Supervision Gait Distance (Feet): 150 Feet Assistive device:  (Pushing O2 tank) Gait Pattern/deviations: Drifts right/left         Stairs             Wheelchair Mobility    Modified Rankin (Stroke Patients Only)        Balance                                            Cognition Arousal/Alertness: Awake/alert Behavior During Therapy: WFL for tasks assessed/performed Overall Cognitive Status: No family/caregiver present to determine baseline cognitive functioning                                        Exercises      General Comments General comments (skin integrity, edema, etc.):  (Reviewed seated strengthening and circulation exercises for B LE's also educated pt on PLB technique to reduce SOB upon exertion.  Pt remained on 3L O2 with O2 sats at 93% at rest decresing to 85% upon exertion)      Pertinent Vitals/Pain Pain Assessment: No/denies pain    Home Living                      Prior Function            PT Goals (current goals can now be found in the care plan section) Acute Rehab PT Goals Patient Stated Goal: to get back home    Frequency    Min 2X/week      PT Plan  Co-evaluation              AM-PAC PT "6 Clicks" Mobility   Outcome Measure  Help needed turning from your back to your side while in a flat bed without using bedrails?: None Help needed moving from lying on your back to sitting on the side of a flat bed without using bedrails?: None Help needed moving to and from a bed to a chair (including a wheelchair)?: A Little Help needed standing up from a chair using your arms (e.g., wheelchair or bedside chair)?: A Little Help needed to walk in hospital room?: A Little Help needed climbing 3-5 steps with a railing? : A Little 6 Click Score: 20    End of Session Equipment Utilized During Treatment: Gait belt;Oxygen Activity Tolerance: Patient tolerated treatment well Patient left: in chair;with call bell/phone within reach;with chair alarm set Nurse Communication: Mobility status PT Visit Diagnosis: Muscle weakness (generalized) (M62.81);Difficulty in walking, not elsewhere classified (R26.2)      Time: 4742-5956 PT Time Calculation (min) (ACUTE ONLY): 41 min  Charges:                       Zadie Cleverly, PTA  Jannet Askew 06/12/2020, 4:43 PM

## 2020-06-13 DIAGNOSIS — J9621 Acute and chronic respiratory failure with hypoxia: Secondary | ICD-10-CM | POA: Diagnosis not present

## 2020-06-13 DIAGNOSIS — J9622 Acute and chronic respiratory failure with hypercapnia: Secondary | ICD-10-CM | POA: Diagnosis not present

## 2020-06-13 LAB — CBC
HCT: 50 % (ref 39.0–52.0)
Hemoglobin: 14.2 g/dL (ref 13.0–17.0)
MCH: 24.8 pg — ABNORMAL LOW (ref 26.0–34.0)
MCHC: 28.4 g/dL — ABNORMAL LOW (ref 30.0–36.0)
MCV: 87.4 fL (ref 80.0–100.0)
Platelets: 145 10*3/uL — ABNORMAL LOW (ref 150–400)
RBC: 5.72 MIL/uL (ref 4.22–5.81)
RDW: 15.9 % — ABNORMAL HIGH (ref 11.5–15.5)
WBC: 9.8 10*3/uL (ref 4.0–10.5)
nRBC: 0 % (ref 0.0–0.2)

## 2020-06-13 LAB — BASIC METABOLIC PANEL
Anion gap: 7 (ref 5–15)
BUN: 30 mg/dL — ABNORMAL HIGH (ref 8–23)
CO2: 44 mmol/L — ABNORMAL HIGH (ref 22–32)
Calcium: 9.7 mg/dL (ref 8.9–10.3)
Chloride: 90 mmol/L — ABNORMAL LOW (ref 98–111)
Creatinine, Ser: 0.45 mg/dL — ABNORMAL LOW (ref 0.61–1.24)
GFR, Estimated: >60 mL/min (ref >60)
Glucose, Bld: 125 mg/dL — ABNORMAL HIGH (ref 70–99)
Potassium: 5.1 mmol/L (ref 3.5–5.1)
Sodium: 141 mmol/L (ref 135–145)

## 2020-06-13 LAB — MAGNESIUM: Magnesium: 2.2 mg/dL (ref 1.7–2.4)

## 2020-06-13 MED ORDER — FUROSEMIDE 10 MG/ML IJ SOLN
40.0000 mg | Freq: Every day | INTRAMUSCULAR | Status: DC
  Filled 2020-06-13: qty 4

## 2020-06-13 MED ORDER — LEVALBUTEROL HCL 0.63 MG/3ML IN NEBU
0.6300 mg | INHALATION_SOLUTION | Freq: Three times a day (TID) | RESPIRATORY_TRACT | Status: DC
  Filled 2020-06-13 (×2): qty 3

## 2020-06-13 MED ORDER — IPRATROPIUM-ALBUTEROL 0.5-2.5 (3) MG/3ML IN SOLN
3.0000 mL | Freq: Two times a day (BID) | RESPIRATORY_TRACT | Status: DC
  Administered 2020-06-13: 3 mL via RESPIRATORY_TRACT
  Filled 2020-06-13: qty 3

## 2020-06-13 MED ORDER — PREDNISONE 50 MG PO TABS
50.0000 mg | ORAL_TABLET | Freq: Every day | ORAL | Status: DC
  Administered 2020-06-13 – 2020-06-14 (×2): 50 mg via ORAL
  Filled 2020-06-13 (×2): qty 1

## 2020-06-13 MED ORDER — LEVALBUTEROL HCL 0.63 MG/3ML IN NEBU
0.6300 mg | INHALATION_SOLUTION | Freq: Two times a day (BID) | RESPIRATORY_TRACT | Status: DC
  Administered 2020-06-13 – 2020-06-14 (×2): 0.63 mg via RESPIRATORY_TRACT
  Filled 2020-06-13 (×5): qty 3

## 2020-06-13 MED ORDER — TORSEMIDE 20 MG PO TABS
20.0000 mg | ORAL_TABLET | Freq: Every day | ORAL | Status: DC
  Administered 2020-06-13 – 2020-06-14 (×2): 20 mg via ORAL
  Filled 2020-06-13 (×2): qty 1

## 2020-06-13 NOTE — Progress Notes (Addendum)
Occupational Therapy Treatment Patient Details Name: Melvin Campbell MRN: [id number] DOB: 01-15-1941 Today's Date: 06/13/2020    History of present illness Pt is a 80 year old male with history of advanced emphysema/COPD/chronic respiratory failure on 3 L, pulmonary embolism on anticoagulation, recent admission for NSTEMI who presents for hypoxia and shortness of breath, placed on bipap, progressed to [place] during the day and NIPPV at night.   OT comments  Mr Cassar was seen for OT treatment on this date. Upon arrival to room pt in urine soaked bed, pt states he spilled water and it is not urine despite the strong smell. Pt is pleasant and agreeable to session. Pt found with O2 removed SpO2 72% on RA. Pt requires MAX cues to don O2 correctly, O2 resolved to 90% on 2L [place]. CGA + RW for toilet t/f - VCs to manage RW, desat 85% on 2L [place] during mobility. Pt denies need to toilet and returns to chair, completes bed making task with SETUP to put pillowcases on x2 pillows seated EOC. Pt again states need to void and returns to toilet, unable to locate restroom door w/o MAX cues. SUPERVISION toileting and perihygiene c lateral leans. Pt making good progress toward goals. Pt continues to benefit from skilled OT services to maximize return to PLOF and minimize risk of future falls, injury, caregiver burden, and readmission. Will continue to follow POC. Discharge recommendation remains appropriate.    Follow Up Recommendations  Home health OT;Supervision - Intermittent    Equipment Recommendations  None recommended by OT    Recommendations for Other Services      Precautions / Restrictions Precautions Precautions: Fall Restrictions Weight Bearing Restrictions: No       Mobility Bed Mobility Overal bed mobility: Needs Assistance Bed Mobility: Supine to Sit;Sit to Supine     Supine to sit: Supervision;HOB elevated Sit to supine: Supervision        Transfers Overall transfer level: Needs  assistance Equipment used: Rolling walker (2 wheeled) Transfers: Sit to/from Stand Sit to Stand: Min guard         General transfer comment: no physcial assist, cues for safety and O2 tank mgmt    Balance Overall balance assessment: Needs assistance Sitting-balance support: Feet supported Sitting balance-Leahy Scale: Good     Standing balance support: Bilateral upper extremity supported Standing balance-Leahy Scale: Fair                             ADL either performed or assessed with clinical judgement   ADL Overall ADL's : Needs assistance/impaired                                       General ADL Comments: MAX cues to don O2 correctly. SETUP to put pillowcases on x2 pillows seated EOC. CGA + RW for ADL t/f - VCs to manage RW. SUPERVISION toileting and perihygiene c lateral leans.               Cognition Arousal/Alertness: Awake/alert Behavior During Therapy: WFL for tasks assessed/performed Overall Cognitive Status: No family/caregiver present to determine baseline cognitive functioning                                 General Comments: States location as [facility], states not on O2 baseline which is  not correct per chart. O2 removed on arrival satting 72%        Exercises Exercises: Other exercises Other Exercises Other Exercises: Pt educated re: OT role, DME recs, d/c recs, falls prevention, ECS Other Exercises: UBD, sit<>Stand x10, toileting, hand washing, bed making, ~50 ft mobility, sitting/standing balance/tolerance           Pertinent Vitals/ Pain       Pain Assessment: No/denies pain         Frequency  Min 1X/week        Progress Toward Goals  OT Goals(current goals can now be found in the care plan section)  Progress towards OT goals: Progressing toward goals  Acute Rehab OT Goals Patient Stated Goal: to get back home OT Goal Formulation: With patient Time For Goal Achievement: 06/24/20 Potential  to Achieve Goals: Good ADL Goals Pt Will Perform Grooming: with modified independence;standing Pt Will Perform Lower Body Dressing: Independently;sit to/from stand Pt Will Transfer to Toilet: with modified independence;ambulating;regular height toilet Pt Will Perform Toileting - Clothing Manipulation and hygiene: with modified independence;sit to/from stand  Plan Discharge plan remains appropriate;Frequency remains appropriate       AM-PAC OT "6 Clicks" Daily Activity     Outcome Measure   Help from another person eating meals?: None Help from another person taking care of personal grooming?: A Little Help from another person toileting, which includes using toliet, bedpan, or urinal?: A Little Help from another person bathing (including washing, rinsing, drying)?: A Little Help from another person to put on and taking off regular upper body clothing?: A Little Help from another person to put on and taking off regular lower body clothing?: A Little 6 Click Score: 19    End of Session Equipment Utilized During Treatment: Oxygen;Rolling walker  OT Visit Diagnosis: Unsteadiness on feet (R26.81)   Activity Tolerance Patient tolerated treatment well   Patient Left in bed;with call bell/phone within reach;with bed alarm set   Nurse Communication Mobility status        Time: 5027-7412 OT Time Calculation (min): 27 min  Charges: OT General Charges $OT Visit: 1 Visit OT Treatments $Self Care/Home Management : 23-37 mins   Kathie Dike, M.S. OTR/L  06/13/20, 2:52 PM  ascom [phone]

## 2020-06-13 NOTE — Progress Notes (Addendum)
PROGRESS NOTE    Melvin Campbell  JJO:[id number] DOB: 05/23/1940 DOA: 06/07/2020 PCP: Melvin Campbell, No Pcp Per (Inactive)   Brief Narrative:  80 year old male with history of advanced emphysema/COPD/chronic respiratory failure on 3 L, pulmonary embolism on anticoagulation, recent admission for NSTEMI who presents for hypoxia and shortness of breath.  Melvin Campbell has advanced emphysema and has been dependent on BiPAP for the majority of his hospital stay.  He also has elements of congestive heart failure.  He was started on aggressive COPD and CHF regimen.  Clinical status has been improving.  Melvin Campbell is on 3 to 4 L during the day and NIPPV at night.  Seems to be tolerating this well.  Making good urine.  Mental status also improving.  4/26: Weaned to 4 l.  Melvin Campbell himself denies use of home oxygen however per documentation is on 3 L chronically   Assessment & Plan:   Principal Problem:   Acute on chronic respiratory failure with hypoxia and hypercapnia (HCC) Active Problems:   Bradycardia   History of pulmonary embolism   HTN (hypertension)   Depression   Dementia with behavioral disturbance (HCC)   Gout   DNR (do not resuscitate)  Acute on chronic hypercapnic hypoxemic respiratory failure Due to acute on chronic COPD exacerbation Pt chronically on 3 L and admitted on BiPAP VBG with pH of 7.3, CO2 97, bicarb 48 Respiratory status improving   - Melvin Campbell has had progressive worsening of COPD with hypercapnia and encephalopathy   - Per pulm, "his lung disease has advanced and he now would benefit from additional support from ventilator such as trelegy due to chronic respiratory failure with advanced COPD and hypercapnia." Plan: --cont multimodal nebs while inpatient --will discharge on home inhalers --transition from IV solumedrol to prednisone today --will set up [facility] (ALF)  Melvin Campbell continues to exhibit signs of hypercapnia associated with chronic respiratory failure  secondary to severe COPD.  Interruption or failure to provide NIV would quickly lead to exacerbation of the Melvin Campbell's condition, hospital re-admission (Melvin Campbell has had 3 hospital admissions year-to-date), and likely harm to the Melvin Campbell. Melvin Campbell was recently hospitalized from 4/9-4/13 for acute on chronic respiratory failure thought due to worsening of his advanced emphysema.  Continued use is preferred.  The use of the NIV will treat Melvin Campbell's high PC02 levels (83 on 04/03/20) and can reduce risk of exacerbations and future hospitalizations when used at night and during the day.  BiPAP has been considered and ruled out as ineffective in providing adequate ventilation necessary to maintain acceptable CO2 levels.   Melvin Campbell will be required to have frequent durations of NIV with AVAPS-AE and any interruption could lead to a decline in the Melvin Campbell's condition requiring readmission.  Acute on chronic diastolic congestive heart failure -CXR shows pulmonary edema -BNP of 429 has been progressively increasing from last two weeks -had possible NSTEMI earlier this year but no invasive intervention due to comorbidies and was medical managment per cardiology  -Last echocardiogram in February showed EF of 60 to 65% -Repeat echocardiogram normal EF with grade 2 diastolic dysfunction -Bicarb increasing, suspect contraction alkalosis.  Pt received a few days of IV lasix Plan: --diurese with oral torsemide, per pulm rec  Bradycardia Asymptomatic.  Heart rate in the range of 50s. Previously documented to have metoprolol decreased from 75 to 50 mg daily but still had 75mg  on med list Plan: --cont Toprol 50 mg daily  History of pulmonary embolism CTA chest on 4/9 negative for PE --cont  home Eliquis  Hypertension --BP varied widely --cont losartan and Toprol  Depression Continue Lexapro  History of gout Continue allopurinol  Dementia Continue Seroquel Home Aricept on hold   DVT prophylaxis:  Eliquis Code Status: DNR Family Communication: daughter updated on the phone today Disposition Plan: Status is: Inpatient   Dispo: The Melvin Campbell is from: ALF              Anticipated d/c is to: ALF tomorrow              Melvin Campbell currently is medically stable to d/c.      Difficult to place Melvin Campbell No   Level of care: Med-Surg  Consultants:   None   Procedures: None  Antimicrobials:   None   Subjective: Pt lost his IV and initially refused to have another placed.  Diuretic changed to oral.  Pt spoke coherently about things but not factual.  Believes he still lives at his own house.  Pt said he would wear BiPAP as doctors prescribed.   Objective: Vitals:   06/13/20 0517 06/13/20 0750 06/13/20 1120 06/13/20 1607  BP: (!) 168/70 (!) 146/66 (!) 160/86 (!) 178/71  Pulse: (!) 54 (!) 52 67 65  Resp:   19 18  Temp:  97.8 F (36.6 C) 97.9 F (36.6 C) 97.7 F (36.5 C)  TempSrc:      SpO2: (!) 88% 90% (!) 89% 90%  Weight:      Height:        Intake/Output Summary (Last 24 hours) at 06/13/2020 1850 Last data filed at 06/13/2020 1015 Gross per 24 hour  Intake 360 ml  Output 600 ml  Net -240 ml   Filed Weights   06/08/20 0230 06/11/20 0500 06/12/20 0500  Weight: 84.3 kg 82.5 kg 82.8 kg    Examination:   Constitutional: NAD, alert, oriented to person and place HEENT: conjunctivae and lids normal, EOMI CV: No cyanosis.   RESP: normal respiratory effort, on 2L with just 1 prong in his nostril. Extremities: No effusions, edema in BLE SKIN: warm, dry Neuro: II - XII grossly intact.   Psych: Normal mood and affect.     Data Reviewed: I have personally reviewed following labs and imaging studies  CBC: Recent Labs  Lab 06/07/20 2308 06/08/20 0546 06/10/20 0840 06/11/20 0730 06/12/20 0504 06/13/20 0327  WBC 13.4* 10.3 16.6* 13.4* 11.6* 9.8  NEUTROABS 9.2*  --  15.7* 12.2* 10.7*  --   HGB 14.2 13.8 14.7 14.7 14.5 14.2  HCT 50.8 49.0 52.1* 51.9 51.1 50.0  MCV  88.8 88.1 88.2 88.0 88.3 87.4  PLT 182 155 202 200 180 145*   Basic Metabolic Panel: Recent Labs  Lab 06/07/20 2308 06/08/20 0546 06/10/20 0840 06/11/20 0730 06/12/20 0504 06/13/20 0327  NA 142 141 141 140 141 141  K 4.1 4.7 4.6 4.8 4.2 5.1  CL 95* 94* 89* 87* 88* 90*  CO2 39* 38* 46* 46* 43* 44*  GLUCOSE 122* 164* 125* 110* 118* 125*  BUN 19 20 26* 29* 31* 30*  CREATININE 0.61 0.75 0.61 0.62 0.63 0.45*  CALCIUM 9.2 9.1 9.5 9.7 9.6 9.7  MG 2.1  --   --   --   --  2.2   GFR: Estimated Creatinine Clearance: 77.1 mL/min (A) (by C-G formula based on SCr of 0.45 mg/dL (L)). Liver Function Tests: Recent Labs  Lab 06/07/20 2308  AST 24  ALT 58*  ALKPHOS 83  BILITOT 1.2  PROT 6.5  ALBUMIN 3.6  No results for input(s): LIPASE, AMYLASE in the last 168 hours. No results for input(s): AMMONIA in the last 168 hours. Coagulation Profile: No results for input(s): INR, PROTIME in the last 168 hours. Cardiac Enzymes: No results for input(s): CKTOTAL, CKMB, CKMBINDEX, TROPONINI in the last 168 hours. BNP (last 3 results) No results for input(s): PROBNP in the last 8760 hours. HbA1C: No results for input(s): HGBA1C in the last 72 hours. CBG: Recent Labs  Lab 06/08/20 0222 06/12/20 1211  GLUCAP 154* 147*   Lipid Profile: No results for input(s): CHOL, HDL, LDLCALC, TRIG, CHOLHDL, LDLDIRECT in the last 72 hours. Thyroid Function Tests: No results for input(s): TSH, T4TOTAL, FREET4, T3FREE, THYROIDAB in the last 72 hours. Anemia Panel: No results for input(s): VITAMINB12, FOLATE, FERRITIN, TIBC, IRON, RETICCTPCT in the last 72 hours. Sepsis Labs: Recent Labs  Lab 06/07/20 2308  PROCALCITON <0.10    Recent Results (from the past 240 hour(s))  Resp Panel by RT-PCR (Flu A&B, Covid) Nasopharyngeal Swab     Status: None   Collection Time: 06/07/20 11:08 PM   Specimen: Nasopharyngeal Swab; Nasopharyngeal(NP) swabs in vial transport medium  Result Value Ref Range Status    SARS Coronavirus 2 by RT PCR NEGATIVE NEGATIVE Final    Comment: (NOTE) SARS-CoV-2 target nucleic acids are NOT DETECTED.  The SARS-CoV-2 RNA is generally detectable in upper respiratory specimens during the acute phase of infection. The lowest concentration of SARS-CoV-2 viral copies this assay can detect is 138 copies/mL. A negative result does not preclude SARS-Cov-2 infection and should not be used as the sole basis for treatment or other Melvin Campbell management decisions. A negative result may occur with  improper specimen collection/handling, submission of specimen other than nasopharyngeal swab, presence of viral mutation(s) within the areas targeted by this assay, and inadequate number of viral copies(<138 copies/mL). A negative result must be combined with clinical observations, Melvin Campbell history, and epidemiological information. The expected result is Negative.  Fact Sheet for Patients:  BloggerCourse.comhttps://www.fda.gov/media/152166/download  Fact Sheet for Healthcare Providers:  SeriousBroker.ithttps://www.fda.gov/media/152162/download  This test is no t yet approved or cleared by the [place] FDA and  has been authorized for detection and/or diagnosis of SARS-CoV-2 by FDA under an Emergency Use Authorization (EUA). This EUA will remain  in effect (meaning this test can be used) for the duration of the COVID-19 declaration under Section 564(b)(1) of the Act, 21 U.S.C.section 360bbb-3(b)(1), unless the authorization is terminated  or revoked sooner.       Influenza A by PCR NEGATIVE NEGATIVE Final   Influenza B by PCR NEGATIVE NEGATIVE Final    Comment: (NOTE) The Xpert Xpress SARS-CoV-2/FLU/RSV plus assay is intended as an aid in the diagnosis of influenza from Nasopharyngeal swab specimens and should not be used as a sole basis for treatment. Nasal washings and aspirates are unacceptable for Xpert Xpress SARS-CoV-2/FLU/RSV testing.  Fact Sheet for  Patients: BloggerCourse.comhttps://www.fda.gov/media/152166/download  Fact Sheet for Healthcare Providers: SeriousBroker.ithttps://www.fda.gov/media/152162/download  This test is not yet approved or cleared by the [place] FDA and has been authorized for detection and/or diagnosis of SARS-CoV-2 by FDA under an Emergency Use Authorization (EUA). This EUA will remain in effect (meaning this test can be used) for the duration of the COVID-19 declaration under Section 564(b)(1) of the Act, 21 U.S.C. section 360bbb-3(b)(1), unless the authorization is terminated or revoked.  Performed at [facility], [address] [place], [place] [address]   MRSA PCR Screening     Status: None   Collection Time:  06/09/20  1:34 AM   Specimen: Nasal Mucosa; Nasopharyngeal  Result Value Ref Range Status   MRSA by PCR NEGATIVE NEGATIVE Final    Comment:        The GeneXpert MRSA Assay (FDA approved for NASAL specimens only), is one component of a comprehensive MRSA colonization surveillance program. It is not intended to diagnose MRSA infection nor to guide or monitor treatment for MRSA infections. Performed at [facility], [address], [place], [place] [address]        Radiology Studies: No results found.    Scheduled Meds: . allopurinol  300 mg Oral Daily  . apixaban  5 mg Oral BID  . aspirin EC  81 mg Oral Daily  . atorvastatin  80 mg Oral Daily  . budesonide (PULMICORT) nebulizer solution  0.25 mg Nebulization BID  . Chlorhexidine Gluconate Cloth  6 each Topical Daily  . escitalopram  20 mg Oral Daily  . folic acid  1 mg Oral Daily  . furosemide  40 mg Intravenous Daily  . levalbuterol  0.63 mg Nebulization BID  . losartan  100 mg Oral Daily  . magnesium oxide  400 mg Oral BID  . metoprolol succinate  50 mg Oral Daily  . predniSONE  50 mg Oral Q breakfast  . QUEtiapine  25 mg Oral QHS  . thiamine  100 mg Oral Daily   Continuous Infusions:   LOS: 5 days     Darlin Priestly,  MD [facility] Pager [phone]  If 7PM-7AM, please contact night-coverage 06/13/2020, 6:50 PM

## 2020-06-13 NOTE — Progress Notes (Signed)
Secure chat with Dr Fran Lowes and Dr Karna Christmas re PIV request.  Decision to leave PIV out and use po diuretics.  Bonnye RN also included in conversation.
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
## 2020-06-13 NOTE — TOC Progression Note (Signed)
Transition of Care [facility]) - Progression Note    Patient Details  Name: Melvin Campbell MRN: [id number] Date of Birth: 30-Oct-1940  Transition of [facility]) CM/SW Contact  Caryn Section, RN Phone Number: 06/13/2020, 4:11 PM  Clinical Narrative:   TOC spoke to patient's daughter, plan is for him to return to [facility] [address]  Provider requests bipap, facility amenable to patient returning with bipap.  Facility and daughter are concerned with patient's compliance, as he is cognitively impaired, but agree to make attempts to assist patient with placing device and monitoring.  [facility] notified by provider and TOC re: bipap, will set up and have assessments and discussions with family and facility.  Patient is [facility] to [facility] for [facility] Nursing.  TOC spoke with rep from home health, they are amenable to accepting patient with bipap, will need post acute care orders upon discharge.  TOC contact information given to daughter and to facility.  TOC to follow through discharge.    Expected Discharge Plan: Long Term Nursing Home Barriers to Discharge: Continued Medical Work up  Expected Discharge Plan and Services Expected Discharge Plan: Long Term Nursing Home In-house Referral: Clinical Social Work   Post Acute Care Choice: Home Health ([facility]) Living arrangements for the past 2 months: Assisted Living Facility ([facility])                                       Social Determinants of Health (SDOH) Interventions    Readmission Risk Interventions Readmission Risk Prevention Plan 06/12/2020  Transportation Screening (No Data)  PCP or Specialist Appt within 3-5 Days (No Data)  HRI or Home Care Consult (No Data)  Social Work Consult for Recovery Care Planning/Counseling Not Complete  SW consult not completed comments RNCM assisgned to patient  Palliative Care Screening Not Applicable  Medication Review Oceanographer) Complete

## 2020-06-13 NOTE — Progress Notes (Signed)
Made MD aware pt pulled out IV as reported to me during night shift and refuses new IV access, unable to give ordered IV lasix at this time

## 2020-06-14 DIAGNOSIS — J9622 Acute and chronic respiratory failure with hypercapnia: Secondary | ICD-10-CM | POA: Diagnosis not present

## 2020-06-14 DIAGNOSIS — J9621 Acute and chronic respiratory failure with hypoxia: Secondary | ICD-10-CM | POA: Diagnosis not present

## 2020-06-14 LAB — CBC
HCT: 51 % (ref 39.0–52.0)
Hemoglobin: 14.9 g/dL (ref 13.0–17.0)
MCH: 25.3 pg — ABNORMAL LOW (ref 26.0–34.0)
MCHC: 29.2 g/dL — ABNORMAL LOW (ref 30.0–36.0)
MCV: 86.4 fL (ref 80.0–100.0)
Platelets: 122 10*3/uL — ABNORMAL LOW (ref 150–400)
RBC: 5.9 MIL/uL — ABNORMAL HIGH (ref 4.22–5.81)
RDW: 16 % — ABNORMAL HIGH (ref 11.5–15.5)
WBC: 9.4 10*3/uL (ref 4.0–10.5)
nRBC: 0 % (ref 0.0–0.2)

## 2020-06-14 LAB — BASIC METABOLIC PANEL
Anion gap: 9 (ref 5–15)
BUN: 31 mg/dL — ABNORMAL HIGH (ref 8–23)
CO2: 46 mmol/L — ABNORMAL HIGH (ref 22–32)
Calcium: 9.6 mg/dL (ref 8.9–10.3)
Chloride: 88 mmol/L — ABNORMAL LOW (ref 98–111)
Creatinine, Ser: 0.59 mg/dL — ABNORMAL LOW (ref 0.61–1.24)
GFR, Estimated: >60 mL/min (ref >60)
Glucose, Bld: 94 mg/dL (ref 70–99)
Potassium: 4.1 mmol/L (ref 3.5–5.1)
Sodium: 143 mmol/L (ref 135–145)

## 2020-06-14 LAB — MAGNESIUM: Magnesium: 2 mg/dL (ref 1.7–2.4)

## 2020-06-14 MED ORDER — PREDNISONE 50 MG PO TABS: ORAL_TABLET | ORAL | 0 refills | Status: DC

## 2020-06-14 MED ORDER — METOPROLOL SUCCINATE ER 25 MG PO TB24: 25.0000 mg | ORAL_TABLET | Freq: Every day | ORAL | Status: AC

## 2020-06-14 MED ORDER — FUROSEMIDE 20 MG PO TABS: 40.0000 mg | ORAL_TABLET | Freq: Every day | ORAL | Status: AC | PRN

## 2020-06-14 MED ORDER — PREDNISONE 20 MG PO TABS: ORAL_TABLET | ORAL | 0 refills | Status: AC

## 2020-06-14 NOTE — [place] FL2 (Signed)
[place] MEDICAID FL2 LEVEL OF CARE SCREENING TOOL     IDENTIFICATION  Patient Name: Melvin Campbell Birthdate: [birth year] Sex: male Admission Date (Current Location): 06/07/2020  [place] and [place] Number:  Chiropodist and Address:  [facility], [address] [place], [place] [address]      Provider Number: [id number]  Attending Physician Name and Address:  Darlin Priestly, MD  Relative Name and Phone Number:  Eryck, Negron (Daughter)   [phone]    Current Level of Care: Hospital Recommended Level of Care: Assisted Living Facility ([facility]) Prior Approval Number:    Date Approved/Denied:   PASRR Number:    Discharge Plan: Other (Comment) ([facility])    Current Diagnoses: Patient Active Problem List   Diagnosis Date Noted  . Acute on chronic respiratory failure with hypoxia and hypercapnia (HCC) 06/08/2020  . Bradycardia 06/08/2020  . History of pulmonary embolism 06/08/2020  . HTN (hypertension) 06/08/2020  . Depression 06/08/2020  . Dementia with behavioral disturbance (HCC) 06/08/2020  . Gout 06/08/2020  . DNR (do not resuscitate) 06/08/2020  . Shortness of breath   . Hypoxia   . ACS (acute coronary syndrome) (HCC) 05/25/2020  . Acute on chronic respiratory failure with hypoxia (HCC) 03/31/2020  . Elevated troponin 03/31/2020  . Community acquired pneumonia 03/31/2020  . Bilateral lower extremity edema 03/31/2020    Orientation RESPIRATION BLADDER Height & Weight     Self  Normal,O2 (Bipap at night through [employer]) Continent (incontinent at times) Weight: 80.5 kg Height:  5\' 7"  (170.2 cm)  BEHAVIORAL SYMPTOMS/MOOD NEUROLOGICAL BOWEL NUTRITION STATUS     (Cognitive Impairment) Continent Diet (Heart Healthy)  AMBULATORY STATUS COMMUNICATION OF NEEDS Skin   Supervision Verbally Normal                       Personal Care Assistance Level of Assistance  Bathing,Feeding,Dressing Bathing Assistance:  Limited assistance Feeding assistance: Independent (Set up) Dressing Assistance: Limited assistance Total Care Assistance: Limited assistance   Functional Limitations Info  Sight,Hearing,Speech Sight Info: Adequate Hearing Info: Adequate Speech Info: Adequate    SPECIAL CARE FACTORS FREQUENCY                       Contractures Contractures Info: Not present    Additional Factors Info  Code Status,Allergies Code Status Info: DNR Allergies Info: Amlodipine           Current Medications (06/14/2020):  This is the current hospital active medication list Current Facility-Administered Medications  Medication Dose Route Frequency Provider Last Rate Last Admin  . allopurinol (ZYLOPRIM) tablet 300 mg  300 mg Oral Daily Tu, Ching T, DO   300 mg at 06/14/20 1037  . apixaban (ELIQUIS) tablet 5 mg  5 mg Oral BID Tu, Ching T, DO   5 mg at 06/14/20 1036  . aspirin EC tablet 81 mg  81 mg Oral Daily Tu, Ching T, DO   81 mg at 06/14/20 1037  . atorvastatin (LIPITOR) tablet 80 mg  80 mg Oral Daily Tu, Ching T, DO   80 mg at 06/14/20 1036  . budesonide (PULMICORT) nebulizer solution 0.25 mg  0.25 mg Nebulization BID 06/16/20 B, MD   0.25 mg at 06/14/20 0755  . Chlorhexidine Gluconate Cloth 2 % PADS 6 each  6 each Topical Daily 06/16/20, MD   6 each at 06/14/20 1039  . escitalopram (LEXAPRO) tablet 20 mg  20  mg Oral Daily Tu, Ching T, DO   20 mg at 06/14/20 1039  . folic acid (FOLVITE) tablet 1 mg  1 mg Oral Daily Tu, Ching T, DO   1 mg at 06/14/20 1037  . levalbuterol (XOPENEX) nebulizer solution 0.63 mg  0.63 mg Nebulization BID Vida Rigger, MD   0.63 mg at 06/14/20 0755  . losartan (COZAAR) tablet 100 mg  100 mg Oral Daily Tu, Ching T, DO   100 mg at 06/14/20 1036  . magnesium oxide TABS 400 mg  400 mg Oral BID Tu, Ching T, DO   400 mg at 06/14/20 1036  . metoprolol succinate (TOPROL-XL) 24 hr tablet 50 mg  50 mg Oral Daily Tu, Ching T, DO   50 mg at 06/14/20 1037  .  predniSONE (DELTASONE) tablet 50 mg  50 mg Oral Q breakfast Darlin Priestly, MD   50 mg at 06/14/20 1038  . QUEtiapine (SEROQUEL) tablet 25 mg  25 mg Oral QHS Tu, Ching T, DO   25 mg at 06/13/20 2052  . thiamine tablet 100 mg  100 mg Oral Daily Tu, Ching T, DO   100 mg at 06/14/20 1039  . torsemide (DEMADEX) tablet 20 mg  20 mg Oral Daily Darlin Priestly, MD   20 mg at 06/14/20 1039     Discharge Medications: . TAKE these medications       acetaminophen 325 MG tablet Commonly known as: TYLENOL Take 650 mg by mouth every 4 (four) hours as needed.   albuterol 108 (90 Base) MCG/ACT inhaler Commonly known as: VENTOLIN HFA Inhale 2 puffs into the lungs every 4 (four) hours as needed.   allopurinol 300 MG tablet Commonly known as: ZYLOPRIM Take 300 mg by mouth daily.   Artificial Tears 1.4 % ophthalmic solution Generic drug: polyvinyl alcohol Place 1 drop into the right eye every 6 (six) hours.   aspirin 81 MG EC tablet Take 1 tablet (81 mg total) by mouth daily. Swallow whole.   atorvastatin 80 MG tablet Commonly known as: LIPITOR Take 1 tablet (80 mg total) by mouth daily.   B-1 100 MG Tabs Take 1 tablet by mouth daily.   budesonide 0.25 MG/2ML nebulizer solution Commonly known as: PULMICORT Take 2 mLs (0.25 mg total) by nebulization 2 (two) times daily.   donepezil 5 MG tablet Commonly known as: ARICEPT Take 5 mg by mouth daily.   Eliquis 5 MG Tabs tablet Generic drug: apixaban Take 5 mg by mouth 2 (two) times daily.   escitalopram 20 MG tablet Commonly known as: LEXAPRO Take 20 mg by mouth daily.   folic acid 1 MG tablet Commonly known as: FOLVITE Take 1 mg by mouth daily.   furosemide 20 MG tablet Commonly known as: LASIX Take 2 tablets (40 mg total) by mouth daily as needed for up to 7 days (pulm edema). What changed:   how much to take  reasons to take this   losartan 100 MG tablet Commonly known as: COZAAR Take 100 mg by mouth daily.    magnesium oxide 400 (241.3 Mg) MG tablet Commonly known as: MAG-OX Take 1 tablet by mouth 2 (two) times daily.   metoprolol succinate 25 MG 24 hr tablet Commonly known as: TOPROL-XL Take 1 tablet (25 mg total) by mouth daily. Take with or immediately following a meal. What changed: how much to take   Multi-Day Plus Minerals Tabs Take 1 tablet by mouth daily.   nitroGLYCERIN 0.4 MG SL tablet Commonly known as:  NITROSTAT Place 1 tablet (0.4 mg total) under the tongue every 5 (five) minutes x 3 doses as needed for chest pain.   prednisoLONE acetate 1 % ophthalmic suspension Commonly known as: PRED FORTE Place 1 drop into the right eye 2 (two) times daily.   predniSONE 50 MG tablet Commonly known as: DELTASONE Take 40 mg from 4/30 to 5/1, then 20 mg from 5/2 to 5/5. Start taking on: June 15, 2020 What changed: additional instructions   QUEtiapine 25 MG tablet Commonly known as: SEROQUEL Take 25 mg by mouth at bedtime.     Relevant Imaging Results:  Relevant Lab Results:   Additional Information SS# [id number]  Caryn Section, RN

## 2020-06-14 NOTE — TOC Transition Note (Signed)
Transition of Care [facility]) - CM/SW Discharge Note   Patient Details  Name: Melvin Campbell MRN: [id number] Date of Birth: [birth year]  Transition of Care [facility]) CM/SW Contact:  Caryn Section, RN Phone Number: 06/14/2020, 11:02 AM   Clinical Narrative:   Spoke to [employer] re: bipap:  Need note from provider for the [facility], at that point they can submit to [facility] and then they will work with ALF to arrange for bipap.      Final next level of care: Long Term Nursing Home Barriers to Discharge: Continued Medical Work up   Patient Goals and CMS Choice Patient states their goals for this hospitalization and ongoing recovery are:: Return ALF w/ home health.      Discharge Placement                       Discharge Plan and Services In-house Referral: Clinical Social Work   Post Acute Care Choice: Home Health ([facility])                               Social Determinants of Health (SDOH) Interventions     Readmission Risk Interventions Readmission Risk Prevention Plan 06/12/2020  Transportation Screening (No Data)  PCP or Specialist Appt within 3-5 Days (No Data)  HRI or Home Care Consult (No Data)  Social Work Consult for Recovery Care Planning/Counseling Not Complete  SW consult not completed comments RNCM assisgned to patient  Palliative Care Screening Not Applicable  Medication Review Oceanographer) Complete

## 2020-06-14 NOTE — TOC Transition Note (Signed)
Transition of Care [facility]) - CM/SW Discharge Note   Patient Details  Name: Melvin Campbell MRN: [id number] Date of Birth: [birth year]  Transition of Care [facility]) CM/SW Contact:  Caryn Section, RN Phone Number: 06/14/2020, 2:11 PM   Clinical Narrative:   Per [employer], still waiting for insurance auth for bipap.  Per Pulmonologist and Hospitalist, patient can go to ALF without bipap until it is approved, but will need to leave his O2 on .  Patient remains confused, only oriented to self, care team is aware.    Daughter aware of discharge without bipap today to ALF.    Final next level of care: Long Term Nursing Home Barriers to Discharge: Continued Medical Work up   Patient Goals and CMS Choice Patient states their goals for this hospitalization and ongoing recovery are:: Return ALF w/ home health.      Discharge Placement                       Discharge Plan and Services In-house Referral: Clinical Social Work   Post Acute Care Choice: Home Health ([facility])                               Social Determinants of Health (SDOH) Interventions     Readmission Risk Interventions Readmission Risk Prevention Plan 06/12/2020  Transportation Screening (No Data)  PCP or Specialist Appt within 3-5 Days (No Data)  HRI or Home Care Consult (No Data)  Social Work Consult for Recovery Care Planning/Counseling Not Complete  SW consult not completed comments RNCM assisgned to patient  Palliative Care Screening Not Applicable  Medication Review Oceanographer) Complete

## 2020-06-14 NOTE — Progress Notes (Signed)
[facility] room# 128 Civil engineer, contracting [facility]) Hospital Liaison Note  Notified by Oliver Pila of family's request for Palliative services once patient goes home.   [facility] liaison will follow patient for disposition.   Please call with any Hospice or oupatient related question or concerns.   Thank you for the opportunity to participate in patient's care.   Thea Gist, RN, [employer] [phone]
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
## 2020-06-14 NOTE — Plan of Care (Signed)

## 2020-06-14 NOTE — Discharge Summary (Addendum)
Physician Discharge Summary   Melvin Campbell  male DOB: Sep 26, 1940  UEA:[id number]  PCP: Patient, No Pcp Per (Inactive)  Admit date: 06/07/2020 Discharge date: 06/14/2020  Admitted From: ALF Disposition:  ALF Discharge plans discussed with daughter over the phone.  CODE STATUS: DNR   Hospital Course:  For full details, please see H&P, progress notes, consult notes and ancillary notes.  Briefly,  Melvin Campbell is a 80 year old male with history of advanced emphysema/COPD/chronic respiratory failure on 3 L, pulmonary embolism on anticoagulation, recent admission for NSTEMI who presented for hypoxia and shortness of breath.  Patient has advanced emphysema and has been dependent on BiPAP for the majority of his hospital stay. He also has elements of congestive heart failure. He was started on aggressive COPD and CHF regimen.  Clinical status has been improving.  Patient is on 2-3 L during the day and NIPPV at night.  Seems to be tolerating this well.  Making good urine.  Mental status also improving.  Acute on chronic hypercapnic hypoxemic respiratory failure Due to acute on chronic COPD exacerbation Pt chronically on 3 L and admitted on BiPAP VBG with pH of 7.3, CO2 97, bicarb 48 Respiratory status improving - patient has had progressive worsening of COPD with hypercapnia and encephalopathy - Per pulm, "his lung disease has advanced and he now would benefit from additional support from ventilator such as trelegy due to chronic respiratory failure with advanced COPD and hypercapnia." --received multimodal nebs while inpatient --will discharge on home inhalers --transition from IV solumedrol to prednisone, and will continue prednisone taper after discharge. --will [facility] (ALF)  [name] continues to exhibit signs of hypercapnia associated with chronic respiratory failure secondary to severe COPD.  Interruption or failure to provide NIV would  quickly lead to exacerbation of the patient's condition, hospital re-admission (patient has had 3 hospital admissions year-to-date), and likely harm to the patient. Patient was recently hospitalized from 4/9-4/13 for acute on chronic respiratory failure thought due to worsening of his advanced emphysema.  Continued use is preferred.  The use of the NIV will treat patient's high PC02 levels (83 on 04/03/20) and can reduce risk of exacerbations and future hospitalizations when used at night and during the day.  BiPAP has been considered and ruled out as ineffective in providing adequate ventilation necessary to maintain acceptable CO2 levels.   Patient will be required to have frequent durations of NIV with AVAPS-AE and any interruption could lead to a decline in the patient's condition requiring readmission.  Acute encephalopathy 2/2 hypoxia and hypercapnia  Acute on chronic diastolic congestive heart failure -CXR shows pulmonary edema -BNP of 429 has been progressively increasing from last two weeks -had possible NSTEMI earlier this year but no invasive intervention due to comorbidies and was medical managment per cardiology  -Last echocardiogram in February showed EF of 60 to 65% -Repeat echocardiogram normal EF with grade 2 diastolic dysfunction -Bicarb increasing, suspect contraction alkalosis.  Pt received a few days of IV lasix during hospitalization. --Pt was on home lasix 20 mg PRN at home. --Pt is discharged on oral Lasix 40 mg daily for 7 days, and follow up with PCP to see if there is need to continue.  Bradycardia Asymptomatic. Heart rate in the range of 50s. Previouslydocumented to havemetoprolol decreased from 75 to 50 mg daily but still had  on med list.  Pt is discharged on Toprol 25 mg daily.  History of pulmonary embolism CTA chest on  4/9 negative for PE --cont home Eliquis  Hypertension --BP varied widely --cont losartan and Toprol  Depression Continue  Lexapro  History of gout Continue allopurinol  Dementia Continue Seroquel and Home Aricept.   Discharge Diagnoses:  Principal Problem:   Acute on chronic respiratory failure with hypoxia and hypercapnia (HCC) Active Problems:   Bradycardia   History of pulmonary embolism   HTN (hypertension)   Depression   Dementia with behavioral disturbance (HCC)   Gout   DNR (do not resuscitate)   30 Day Unplanned Readmission Risk Score   Flowsheet Row ED to Hosp-Admission (Current) from 06/07/2020 in [facility] (1C)  30 Day Unplanned Readmission Risk Score (%) 25.34 Filed at 06/14/2020 0801     This score is the patient's risk of an unplanned readmission within 30 days of being discharged (0 -100%). The score is based on dignosis, age, lab data, medications, orders, and past utilization.   Low:  0-14.9   Medium: 15-21.9   High: 22-29.9   Extreme: 30 and above        Discharge Instructions:  Allergies as of 06/14/2020      Reactions   Amlodipine       Medication List    TAKE these medications   acetaminophen 325 MG tablet Commonly known as: TYLENOL Take 650 mg by mouth every 4 (four) hours as needed.   albuterol 108 (90 Base) MCG/ACT inhaler Commonly known as: VENTOLIN HFA Inhale 2 puffs into the lungs every 4 (four) hours as needed.   allopurinol 300 MG tablet Commonly known as: ZYLOPRIM Take 300 mg by mouth daily.   Artificial Tears 1.4 % ophthalmic solution Generic drug: polyvinyl alcohol Place 1 drop into the right eye every 6 (six) hours.   aspirin 81 MG EC tablet Take 1 tablet (81 mg total) by mouth daily. Swallow whole.   atorvastatin 80 MG tablet Commonly known as: LIPITOR Take 1 tablet (80 mg total) by mouth daily.   B-1 100 MG Tabs Take 1 tablet by mouth daily.   budesonide 0.25 MG/2ML nebulizer solution Commonly known as: PULMICORT Take 2 mLs (0.25 mg total) by nebulization 2 (two) times daily.   donepezil 5 MG  tablet Commonly known as: ARICEPT Take 5 mg by mouth daily.   Eliquis 5 MG Tabs tablet Generic drug: apixaban Take 5 mg by mouth 2 (two) times daily.   escitalopram 20 MG tablet Commonly known as: LEXAPRO Take 20 mg by mouth daily.   folic acid 1 MG tablet Commonly known as: FOLVITE Take 1 mg by mouth daily.   furosemide 20 MG tablet Commonly known as: LASIX Take 2 tablets (40 mg total) by mouth daily as needed for up to 7 days (pulm edema). What changed:   how much to take  reasons to take this   losartan 100 MG tablet Commonly known as: COZAAR Take 100 mg by mouth daily.   magnesium oxide 400 (241.3 Mg) MG tablet Commonly known as: MAG-OX Take 1 tablet by mouth 2 (two) times daily.   metoprolol succinate 25 MG 24 hr tablet Commonly known as: TOPROL-XL Take 1 tablet (25 mg total) by mouth daily. Take with or immediately following a meal. What changed: how much to take   Multi-Day Plus Minerals Tabs Take 1 tablet by mouth daily.   nitroGLYCERIN 0.4 MG SL tablet Commonly known as: NITROSTAT Place 1 tablet (0.4 mg total) under the tongue every 5 (five) minutes x 3 doses as needed for chest  pain.   prednisoLONE acetate 1 % ophthalmic suspension Commonly known as: PRED FORTE Place 1 drop into the right eye 2 (two) times daily.   predniSONE 50 MG tablet Commonly known as: DELTASONE Take 40 mg from 4/30 to 5/1, then 20 mg from 5/2 to 5/5. Start taking on: June 15, 2020 What changed: additional instructions   QUEtiapine 25 MG tablet Commonly known as: SEROQUEL Take 25 mg by mouth at bedtime.            Durable Medical Equipment  (From admission, onward)         Start     Ordered   06/11/20 1054  For home use only DME Walker  Once       Question:  Patient needs a walker to treat with the following condition  Answer:  Weakness   06/11/20 1054           Follow-up Information    Vida Rigger, MD. Schedule an appointment as soon as possible for a  visit in 1 week(s).   Specialty: Pulmonary Disease Contact information: [address] [place] [address] [phone]               Allergies  Allergen Reactions  . Amlodipine      The results of significant diagnostics from this hospitalization (including imaging, microbiology, ancillary and laboratory) are listed below for reference.   Consultations:   Procedures/Studies: DG Chest 2 View  Result Date: 05/25/2020 CLINICAL DATA:  Shortness of breath EXAM: CHEST - 2 VIEW COMPARISON:  04/10/2020 FINDINGS: The heart size and mediastinal contours are within normal limits. Low volume examination with diffuse bilateral interstitial pulmonary opacity and atelectasis or consolidation of the lung bases, generally similar in appearance to prior examination. Disc degenerative disease of the thoracic spine. IMPRESSION: Low volume examination with diffuse bilateral interstitial pulmonary opacity and atelectasis or consolidation of the lung bases, generally similar in appearance to prior examination. Findings may reflect edema and/or chronic interstitial change. No new or focal airspace opacity. Electronically Signed   By: Lauralyn Primes M.D.   On: 05/25/2020 21:58   CT Angio Chest PE W and/or Wo Contrast  Result Date: 05/25/2020 CLINICAL DATA:  Shortness of breath EXAM: CT ANGIOGRAPHY CHEST WITH CONTRAST TECHNIQUE: Multidetector CT imaging of the chest was performed using the standard protocol during bolus administration of intravenous contrast. Multiplanar CT image reconstructions and MIPs were obtained to evaluate the vascular anatomy. CONTRAST:  76mL OMNIPAQUE IOHEXOL 350 MG/ML SOLN COMPARISON:  03/31/2020 FINDINGS: Cardiovascular: Satisfactory opacification of the pulmonary arteries to the segmental level. No evidence of pulmonary embolism. Cardiomegaly. Three-vessel coronary artery calcifications. No pericardial effusion. Aortic atherosclerosis. Mediastinum/Nodes: No enlarged  mediastinal, hilar, or axillary lymph nodes. Thyroid gland, trachea, and esophagus demonstrate no significant findings. Lungs/Pleura: Small left pleural effusion. Low pulmonary volumes with extensive dependent bibasilar atelectasis and/or consolidation, similar in appearance to prior examination. Mild centrilobular emphysema. Upper Abdomen: No acute abnormality. Musculoskeletal: No chest wall abnormality. No acute or significant osseous findings. Review of the MIP images confirms the above findings. IMPRESSION: 1. Negative examination for pulmonary embolism. 2. Small left pleural effusion. 3. Low pulmonary volumes with extensive dependent bibasilar atelectasis and/or consolidation, similar in appearance to prior examination. 4. Emphysema. 5. Coronary artery disease. Aortic Atherosclerosis (ICD10-I70.0) and Emphysema (ICD10-J43.9). Electronically Signed   By: Lauralyn Primes M.D.   On: 05/25/2020 22:55   DG Chest Portable 1 View  Result Date: 06/07/2020 CLINICAL DATA:  Dyspnea EXAM: PORTABLE CHEST 1  VIEW COMPARISON:  05/25/2020, CT 05/25/2020 FINDINGS: Stable cardiomediastinal silhouette. Vascular congestion and mild diffuse interstitial and ground-glass opacity likely pulmonary edema. Small left-sided pleural effusion. No pneumothorax. Aortic atherosclerosis. IMPRESSION: Continued vascular congestion and mild diffuse interstitial and ground-glass opacity likely pulmonary edema. Small left-sided pleural effusion. Electronically Signed   By: Jasmine Pang M.D.   On: 06/07/2020 23:15   ECHOCARDIOGRAM COMPLETE  Result Date: 06/08/2020    ECHOCARDIOGRAM REPORT   Patient Name:   ZAKK BORGEN Date of Exam: 06/08/2020 Medical Rec #:  [id number]          Height:       67.0 in Accession #:    [id number]         Weight:       185.8 lb Date of Birth:  [birth year]           BSA:          1.960 m Patient Age:    79 years           BP:           128/67 mmHg Patient Gender: M                  HR:           65 bpm. Exam  Location:  [facility] Procedure: 2D Echo and Intracardiac Opacification Agent Indications:     Elevated Troponin  History:         Patient has prior history of Echocardiogram examinations, most                  recent 04/01/2020.  Sonographer:     Wonda Cerise RDCS Referring Phys:  [id number] CHING T TU Diagnosing Phys: Julien Nordmann MD  Sonographer Comments: Technically difficult study due to poor echo windows. Image acquisition challenging due to patient body habitus. IMPRESSIONS  1. Left ventricular ejection fraction, by estimation, is 60 to 65%. The left ventricle has normal function. The left ventricle has no regional wall motion abnormalities. Left ventricular diastolic parameters are consistent with Grade II diastolic dysfunction (pseudonormalization).  2. Right ventricular systolic function is normal. The right ventricular size is normal. Tricuspid regurgitation signal is inadequate for assessing PA pressure.  3. Left atrial size was mildly dilated.  4. There is severe calcifcation of the aortic valve. Suspect at least mild to moderate, possibly moderate aortic stenosis visually, though unable to accurately estimate as pressure gradients not recorded secondary to challenging images. FINDINGS  Left Ventricle: Left ventricular ejection fraction, by estimation, is 60 to 65%. The left ventricle has normal function. The left ventricle has no regional wall motion abnormalities. Definity contrast agent was given IV to delineate the left ventricular  endocardial borders. The left ventricular internal cavity size was normal in size. There is no left ventricular hypertrophy. Left ventricular diastolic parameters are consistent with Grade II diastolic dysfunction (pseudonormalization). Right Ventricle: The right ventricular size is normal. No increase in right ventricular wall thickness. Right ventricular systolic function is normal. Tricuspid regurgitation signal is inadequate for assessing PA pressure. Left Atrium: Left  atrial size was mildly dilated. Right Atrium: Right atrial size was normal in size. Pericardium: There is no evidence of pericardial effusion. Mitral Valve: The mitral valve is normal in structure. Moderate mitral annular calcification. No evidence of mitral valve regurgitation. No evidence of mitral valve stenosis. Tricuspid Valve: The tricuspid valve is normal in structure. Tricuspid valve regurgitation is not demonstrated. No evidence of tricuspid stenosis. Aortic Valve:  The aortic valve is normal in structure. There is severe calcifcation of the aortic valve. Aortic valve regurgitation is not visualized. No aortic stenosis is present. Pulmonic Valve: The pulmonic valve was normal in structure. Pulmonic valve regurgitation is not visualized. No evidence of pulmonic stenosis. Aorta: The aortic root is normal in size and structure. Venous: The inferior vena cava is normal in size with greater than 50% respiratory variability, suggesting right atrial pressure of 3 mmHg. IAS/Shunts: No atrial level shunt detected by color flow Doppler.  LEFT VENTRICLE PLAX 2D LVIDd:         5.23 cm  Diastology LVIDs:         4.04 cm  LV e' medial:    3.70 cm/s LV PW:         1.07 cm  LV E/e' medial:  29.5 LV IVS:        1.16 cm  LV e' lateral:   5.98 cm/s LVOT diam:     2.10 cm  LV E/e' lateral: 18.2 LVOT Area:     3.46 cm  RIGHT VENTRICLE RV S prime:     17.70 cm/s TAPSE (M-mode): 2.6 cm LEFT ATRIUM             Index LA diam:        4.20 cm 2.14 cm/m LA Vol (A2C):   47.4 ml 24.18 ml/m LA Vol (A4C):   34.2 ml 17.45 ml/m LA Biplane Vol: 43.5 ml 22.19 ml/m                        PULMONIC VALVE AORTA                 PV Vmax:       1.06 m/s Ao Root diam: 3.90 cm PV Peak grad:  4.5 mmHg Ao Asc diam:  3.50 cm  MITRAL VALVE MV Area (PHT): 5.06 cm     SHUNTS MV Decel Time: 150 msec     Systemic Diam: 2.10 cm MV E velocity: 109.00 cm/s MV A velocity: 95.10 cm/s MV E/A ratio:  1.15 Julien Nordmannimothy Gollan MD Electronically signed by Julien Nordmannimothy  Gollan MD Signature Date/Time: 06/08/2020/1:44:59 PM    Final       Labs: BNP (last 3 results) Recent Labs    05/26/20 0035 05/27/20 0415 06/07/20 2308  BNP 386.4* 524.0* 429.8*   Basic Metabolic Panel: Recent Labs  Lab 06/07/20 2308 06/08/20 0546 06/10/20 0840 06/11/20 0730 06/12/20 0504 06/13/20 0327 06/14/20 0516  NA 142   < > 141 140 141 141 143  K 4.1   < > 4.6 4.8 4.2 5.1 4.1  CL 95*   < > 89* 87* 88* 90* 88*  CO2 39*   < > 46* 46* 43* 44* 46*  GLUCOSE 122*   < > 125* 110* 118* 125* 94  BUN 19   < > 26* 29* 31* 30* 31*  CREATININE 0.61   < > 0.61 0.62 0.63 0.45* 0.59*  CALCIUM 9.2   < > 9.5 9.7 9.6 9.7 9.6  MG 2.1  --   --   --   --  2.2 2.0   < > = values in this interval not displayed.   Liver Function Tests: Recent Labs  Lab 06/07/20 2308  AST 24  ALT 58*  ALKPHOS 83  BILITOT 1.2  PROT 6.5  ALBUMIN 3.6   No results for input(s): LIPASE, AMYLASE in the last 168 hours. No results for  input(s): AMMONIA in the last 168 hours. CBC: Recent Labs  Lab 06/07/20 2308 06/08/20 0546 06/10/20 0840 06/11/20 0730 06/12/20 0504 06/13/20 0327 06/14/20 0516  WBC 13.4*   < > 16.6* 13.4* 11.6* 9.8 9.4  NEUTROABS 9.2*  --  15.7* 12.2* 10.7*  --   --   HGB 14.2   < > 14.7 14.7 14.5 14.2 14.9  HCT 50.8   < > 52.1* 51.9 51.1 50.0 51.0  MCV 88.8   < > 88.2 88.0 88.3 87.4 86.4  PLT 182   < > 202 200 180 145* 122*   < > = values in this interval not displayed.   Cardiac Enzymes: No results for input(s): CKTOTAL, CKMB, CKMBINDEX, TROPONINI in the last 168 hours. BNP: Invalid input(s): POCBNP CBG: Recent Labs  Lab 06/08/20 0222 06/12/20 1211  GLUCAP 154* 147*   D-Dimer No results for input(s): DDIMER in the last 72 hours. Hgb A1c No results for input(s): HGBA1C in the last 72 hours. Lipid Profile No results for input(s): CHOL, HDL, LDLCALC, TRIG, CHOLHDL, LDLDIRECT in the last 72 hours. Thyroid function studies No results for input(s): TSH, T4TOTAL,  T3FREE, THYROIDAB in the last 72 hours.  Invalid input(s): FREET3 Anemia work up No results for input(s): VITAMINB12, FOLATE, FERRITIN, TIBC, IRON, RETICCTPCT in the last 72 hours. Urinalysis No results found for: COLORURINE, APPEARANCEUR, LABSPEC, PHURINE, GLUCOSEU, HGBUR, BILIRUBINUR, KETONESUR, PROTEINUR, UROBILINOGEN, NITRITE, LEUKOCYTESUR Sepsis Labs Invalid input(s): PROCALCITONIN,  WBC,  LACTICIDVEN Microbiology Recent Results (from the past 240 hour(s))  Resp Panel by RT-PCR (Flu A&B, Covid) Nasopharyngeal Swab     Status: None   Collection Time: 06/07/20 11:08 PM   Specimen: Nasopharyngeal Swab; Nasopharyngeal(NP) swabs in vial transport medium  Result Value Ref Range Status   SARS Coronavirus 2 by RT PCR NEGATIVE NEGATIVE Final    Comment: (NOTE) SARS-CoV-2 target nucleic acids are NOT DETECTED.  The SARS-CoV-2 RNA is generally detectable in upper respiratory specimens during the acute phase of infection. The lowest concentration of SARS-CoV-2 viral copies this assay can detect is 138 copies/mL. A negative result does not preclude SARS-Cov-2 infection and should not be used as the sole basis for treatment or other patient management decisions. A negative result may occur with  improper specimen collection/handling, submission of specimen other than nasopharyngeal swab, presence of viral mutation(s) within the areas targeted by this assay, and inadequate number of viral copies(<138 copies/mL). A negative result must be combined with clinical observations, patient history, and epidemiological information. The expected result is Negative.  Fact Sheet for Patients:  BloggerCourse.com  Fact Sheet for Healthcare Providers:  SeriousBroker.it  This test is no t yet approved or cleared by the [place] FDA and  has been authorized for detection and/or diagnosis of SARS-CoV-2 by FDA under an Emergency Use Authorization  (EUA). This EUA will remain  in effect (meaning this test can be used) for the duration of the COVID-19 declaration under Section 564(b)(1) of the Act, 21 U.S.C.section 360bbb-3(b)(1), unless the authorization is terminated  or revoked sooner.       Influenza A by PCR NEGATIVE NEGATIVE Final   Influenza B by PCR NEGATIVE NEGATIVE Final    Comment: (NOTE) The Xpert Xpress SARS-CoV-2/FLU/RSV plus assay is intended as an aid in the diagnosis of influenza from Nasopharyngeal swab specimens and should not be used as a sole basis for treatment. Nasal washings and aspirates are unacceptable for Xpert Xpress SARS-CoV-2/FLU/RSV testing.  Fact Sheet for Patients: BloggerCourse.com  Fact Sheet for  Healthcare Providers: SeriousBroker.it  This test is not yet approved or cleared by the [place] and has been authorized for detection and/or diagnosis of SARS-CoV-2 by FDA under an Emergency Use Authorization (EUA). This EUA will remain in effect (meaning this test can be used) for the duration of the COVID-19 declaration under Section 564(b)(1) of the Act, 21 U.S.C. section 360bbb-3(b)(1), unless the authorization is terminated or revoked.  Performed at [facility], [address] [place], [place] [address]   MRSA PCR Screening     Status: None   Collection Time: 06/09/20  1:34 AM   Specimen: Nasal Mucosa; Nasopharyngeal  Result Value Ref Range Status   MRSA by PCR NEGATIVE NEGATIVE Final    Comment:        The GeneXpert MRSA Assay (FDA approved for NASAL specimens only), is one component of a comprehensive MRSA colonization surveillance program. It is not intended to diagnose MRSA infection nor to guide or monitor treatment for MRSA infections. Performed at [facility], [address] [place], [place] [address]      Total time spend on discharging this patient, including the last patient exam,  discussing the hospital stay, instructions for ongoing care as it relates to all pertinent caregivers, as well as preparing the medical discharge records, prescriptions, and/or referrals as applicable, is 30 minutes.    Darlin Priestly, MD  [facility] 06/14/2020, 8:52 AM

## 2020-06-14 NOTE — TOC Transition Note (Signed)
Transition of Care [facility]) - CM/SW Discharge Note   Patient Details  Name: Melvin Campbell MRN: [id number] Date of Birth: 1940/10/24  Transition of Care [facility]) CM/SW Contact:  Caryn Section, RN Phone Number: 06/14/2020, 4:36 PM   Clinical Narrative:   Patient being transported home by facility [facility]. Daughter requested outpatient palliative consult.  Team notified.    Final next level of care: Long Term Nursing Home Barriers to Discharge: Continued Medical Work up   Patient Goals and CMS Choice Patient states their goals for this hospitalization and ongoing recovery are:: Return ALF w/ home health.      Discharge Placement                       Discharge Plan and Services In-house Referral: Clinical Social Work   Post Acute Care Choice: Home Health ([facility])                               Social Determinants of Health (SDOH) Interventions     Readmission Risk Interventions Readmission Risk Prevention Plan 06/12/2020  Transportation Screening (No Data)  PCP or Specialist Appt within 3-5 Days (No Data)  HRI or Home Care Consult (No Data)  Social Work Consult for Recovery Care Planning/Counseling Not Complete  SW consult not completed comments RNCM assisgned to patient  Palliative Care Screening Not Applicable  Medication Review Oceanographer) Complete

## 2020-06-25 LAB — BLOOD GAS, VENOUS
Acid-Base Excess: 16.3 mmol/L — ABNORMAL HIGH (ref 0.0–2.0)
Bicarbonate: 48.2 mmol/L — ABNORMAL HIGH (ref 20.0–28.0)
Delivery systems: POSITIVE
FIO2: 0.45
O2 Saturation: 67.3 %
Patient temperature: 37
pCO2, Ven: 98 mmHg (ref 44.0–60.0)
pH, Ven: 7.3 (ref 7.250–7.430)
pO2, Ven: 39 mmHg (ref 32.0–45.0)

## 2020-06-26 ENCOUNTER — Encounter: Payer: Self-pay | Admitting: Adult Health Nurse Practitioner

## 2020-06-26 ENCOUNTER — Non-Acute Institutional Stay: Payer: Medicare Other | Admitting: Adult Health Nurse Practitioner

## 2020-06-26 ENCOUNTER — Other Ambulatory Visit: Payer: Self-pay

## 2020-06-26 VITALS — HR 54 | Ht 67.0 in | Wt 177.5 lb

## 2020-06-26 DIAGNOSIS — Z515 Encounter for palliative care: Secondary | ICD-10-CM

## 2020-06-26 DIAGNOSIS — F0281 Dementia in other diseases classified elsewhere with behavioral disturbance: Secondary | ICD-10-CM

## 2020-06-26 DIAGNOSIS — J9621 Acute and chronic respiratory failure with hypoxia: Secondary | ICD-10-CM

## 2020-06-26 NOTE — Progress Notes (Signed)
[facility] Palliative Care Consult Note Telephone: [phone]  Fax: [phone]    Date of encounter: 06/26/20 PATIENT NAME: Melvin Campbell [address] [place] [address]   [phone] (home)  DOB: 10-11-1940 MRN: [id number] PRIMARY CARE PROVIDER:    Merrily Brittle Campbell   REFERRING PROVIDER:   Merrily Brittle Campbell  RESPONSIBLE PARTY:    Contact Information    Name Relation Home Work Mobile   Melvin Campbell Daughter [phone]  [phone]       I met face to face with patient in home. Palliative Care was asked to follow this patient by consultation request of  Melvin Campbell to address advance care planning and complex medical decision making. This is the initial visit.   Talked with daughter via phone to update on today's visit                                    ASSESSMENT AND PLAN / RECOMMENDATIONS:   Advance Care Planning/Goals of Care: Goals include to maximize quality of life and symptom management.   CODE STATUS: DNR  Symptom Management/Plan:  Acute on chronic respiratory failure: Recommendations for trilogy after last hospitalization.  Daughter not wanting to pursue this as he has not tolerated BiPAP in the past.  Dementia: This is stable at this time.  He gets delirium when he goes into respiratory failure.  Daughter and I talked at length about options of pursuing pulmonology consult and trying trilogy versus making patient comfortable pursuing hospice.  Daughter would like to focus on his quality of life and would like hospice services if he is eligible.  Will discuss case with hospice physicians.  Daughter encouraged to call with any questions or concerns.  Follow up Palliative Care Visit: Palliative care will continue to follow for complex medical decision making, advance care planning, and clarification of goals.   I spent 75 minutes providing this consultation. More than 50% of the time in this consultation  was spent in counseling and care coordination.   PPS: 40%  HOSPICE ELIGIBILITY/DIAGNOSIS: TBD  Chief Complaint: initial palliative visit  HISTORY OF PRESENT ILLNESS:  Melvin Campbell is a 80 y.o. year old male  with acute on chronic respiratory failure, COPD, HTN, gout, bradycardia, pulmonary embolism on Eliquis, depression, dementia, HLD, PCM.  Patient is resident of ALF since November 2021.  Patient has had 3 hospitalizations over the past 3 months for acute on chronic respiratory failure and NSTEMI.  Chest x-ray at last hospitalization at end of April showed pulmonary edema and echo with normal EF with grade 2 diastolic dysfunction.  Patient having bradycardia and his Toprol was decreased to 25 mg daily.  His baseline CO2 levels are in the 40s.  Daughter does state that this time last year he was living alone and driving.  He now is in ALF and can walk with out assistive devices in his room but uses walker for longer distances.  Though gets SOB just walking to his bed to the bathroom and uses a urinal to conserve energy.  Staff does report he gets winded with any activity.  He can perform ADLs but takes a long time.  Daughter helps him to shower.  Has gone from PPS 70% to 40%.  He has had a 5% weight loss over the past 3 months.  03/31/20 he weighed 187 pounds with BMI 29.28 and now  weighs 177.5 pounds with BMI of 27.79.  He eats less than 25% of his meals.   The patient alert and oriented x3 today he answers all provider questions with "I am fine."  HPI/ROS may be unreliable secondary to dementia.  Daughter states that after last hospitalization they want him on a Trilogy but she does not want to pursue this because in the hospital he would not wear the BiPap and the ALF will require him to move if he gets the Trilogy. When I met with him he was A&O x3, though when his CO2 levels increase he gets very confused.      History obtained from review of EMR, and interview with family, facility staff and  Melvin Campbell.  I reviewed available labs, medications, imaging, studies and related documents from the EMR.  Records reviewed and summarized above.    Physical Exam:  Constitutional: NAD General:WNWD EYES: anicteric sclera, lids intact, no discharge  ENMT: intact hearing, oral mucous membranes moist CV: S1S2, RRR, no LE edema Pulmonary: LCTA, no increased work of breathing, no cough, room air Abdomen: normo-active BS + 4 quadrants, soft and non tender MSK: moves all extremities, ambulatory and uses walker occasionally Skin: warm and dry, no rashes or wounds on visible skin Neuro:  A and O x 3 though has forgetfulness Hem/lymph/immuno: no widespread bruising   CURRENT PROBLEM LIST:  Patient Active Problem List   Diagnosis Date Noted  . Acute on chronic respiratory failure with hypoxia and hypercapnia ([place]) 06/08/2020  . Bradycardia 06/08/2020  . History of pulmonary embolism 06/08/2020  . HTN (hypertension) 06/08/2020  . Depression 06/08/2020  . Dementia with behavioral disturbance ([place]) 06/08/2020  . Gout 06/08/2020  . DNR (do not resuscitate) 06/08/2020  . Shortness of breath   . Hypoxia   . ACS (acute coronary syndrome) ([place]) 05/25/2020  . Acute on chronic respiratory failure with hypoxia ([place]) 03/31/2020  . Elevated troponin 03/31/2020  . Community acquired pneumonia 03/31/2020  . Bilateral lower extremity edema 03/31/2020   PAST MEDICAL HISTORY:  Active Ambulatory Problems    Diagnosis Date Noted  . Acute on chronic respiratory failure with hypoxia ([place]) 03/31/2020  . Elevated troponin 03/31/2020  . Community acquired pneumonia 03/31/2020  . Bilateral lower extremity edema 03/31/2020  . ACS (acute coronary syndrome) ([place]) 05/25/2020  . Shortness of breath   . Hypoxia   . Acute on chronic respiratory failure with hypoxia and hypercapnia ([place]) 06/08/2020  . Bradycardia 06/08/2020  . History of pulmonary embolism 06/08/2020  . HTN (hypertension) 06/08/2020  .  Depression 06/08/2020  . Dementia with behavioral disturbance ([place]) 06/08/2020  . Gout 06/08/2020  . DNR (do not resuscitate) 06/08/2020   Resolved Ambulatory Problems    Diagnosis Date Noted  . NSTEMI (non-ST elevated myocardial infarction) ([place]) 03/31/2020   No Additional Past Medical History   SOCIAL HX:  Social History   Tobacco Use  . Smoking status: Never Smoker  . Smokeless tobacco: Never Used  Substance Use Topics  . Alcohol use: Not Currently   FAMILY HX: No family history on file.    ALLERGIES:  Allergies  Allergen Reactions  . Amlodipine      PERTINENT MEDICATIONS:  Outpatient Encounter Medications as of 06/26/2020  Medication Sig  . acetaminophen (TYLENOL) 325 MG tablet Take 650 mg by mouth every 4 (four) hours as needed.  Marland Kitchen albuterol (VENTOLIN HFA) 108 (90 Base) MCG/ACT inhaler Inhale 2 puffs into the lungs every 4 (four) hours as needed.  Marland Kitchen  allopurinol (ZYLOPRIM) 300 MG tablet Take 300 mg by mouth daily.  . ARTIFICIAL TEARS 1.4 % ophthalmic solution Place 1 drop into the right eye every 6 (six) hours.  Marland Kitchen aspirin 81 MG EC tablet Take 1 tablet (81 mg total) by mouth daily. Swallow whole.  Marland Kitchen atorvastatin (LIPITOR) 80 MG tablet Take 1 tablet (80 mg total) by mouth daily.  . budesonide (PULMICORT) 0.25 MG/2ML nebulizer solution Take 2 mLs (0.25 mg total) by nebulization 2 (two) times daily.  Marland Kitchen donepezil (ARICEPT) 5 MG tablet Take 5 mg by mouth daily.  Marland Kitchen ELIQUIS 5 MG TABS tablet Take 5 mg by mouth 2 (two) times daily.  Marland Kitchen escitalopram (LEXAPRO) 20 MG tablet Take 20 mg by mouth daily.  . folic acid (FOLVITE) 1 MG tablet Take 1 mg by mouth daily.  . furosemide (LASIX) 20 MG tablet Take 2 tablets (40 mg total) by mouth daily as needed for up to 7 days (pulm edema).  . losartan (COZAAR) 100 MG tablet Take 100 mg by mouth daily.  . magnesium oxide (MAG-OX) 400 (241.3 Mg) MG tablet Take 1 tablet by mouth 2 (two) times daily.  . metoprolol succinate (TOPROL-XL) 25 MG 24 hr  tablet Take 1 tablet (25 mg total) by mouth daily. Take with or immediately following a meal.  . Multiple Vitamins-Minerals (MULTI-DAY PLUS MINERALS) TABS Take 1 tablet by mouth daily.  . nitroGLYCERIN (NITROSTAT) 0.4 MG SL tablet Place 1 tablet (0.4 mg total) under the tongue every 5 (five) minutes x 3 doses as needed for chest pain.  . prednisoLONE acetate (PRED FORTE) 1 % ophthalmic suspension Place 1 drop into the right eye 2 (two) times daily.  . predniSONE (DELTASONE) 20 MG tablet Take 40 mg from 4/30 to 5/1, then 20 mg from 5/2 to 5/5.  Marland Kitchen QUEtiapine (SEROQUEL) 25 MG tablet Take 25 mg by mouth at bedtime.  . Thiamine HCl (B-1) 100 MG TABS Take 1 tablet by mouth daily.   No facility-administered encounter medications on file as of 06/26/2020.    Thank you for the opportunity to participate in the care of Mr. Bivins.  The palliative care team will continue to follow. Please call our office at [phone] if we can be of additional assistance.   Teng Decou Jenetta Downer, [place] , DNP  This chart was dictated using voice recognition software. Despite best efforts to proofread, errors can occur which can change the documentation meaning.   COVID-19 PATIENT SCREENING TOOL Asked and negative response unless otherwise noted:   Have you had symptoms of covid, tested positive or been in contact with someone with symptoms/positive test in the past 5-10 days? Negative

## 2020-06-27 ENCOUNTER — Telehealth: Payer: Self-pay | Admitting: Adult Health Nurse Practitioner

## 2020-06-27 NOTE — Telephone Encounter (Signed)
Spoke with daughter about her father's hospice eligibility.  She would like to proceed with hospice.  Answered her questions.  Encouraged to call with any further questions. Reached out to Dr. Neila Gear (patient's PCP per daughter) office for hospice referral and if he will be attending.  Was informed at office that Dr. Emelda Brothers is out of town but he has other providers covering for him Nerida Boivin K. Garner Nash NP

## 2020-08-16 DEATH — deceased

## 2022-10-16 IMAGING — CR DG CHEST 2V
3 series · 3 of 3 positions shown · non-contrast
Comparison: None available.

CLINICAL DATA: Shortness of breath.

EXAM:
CHEST - 2 VIEW

[chest lat]
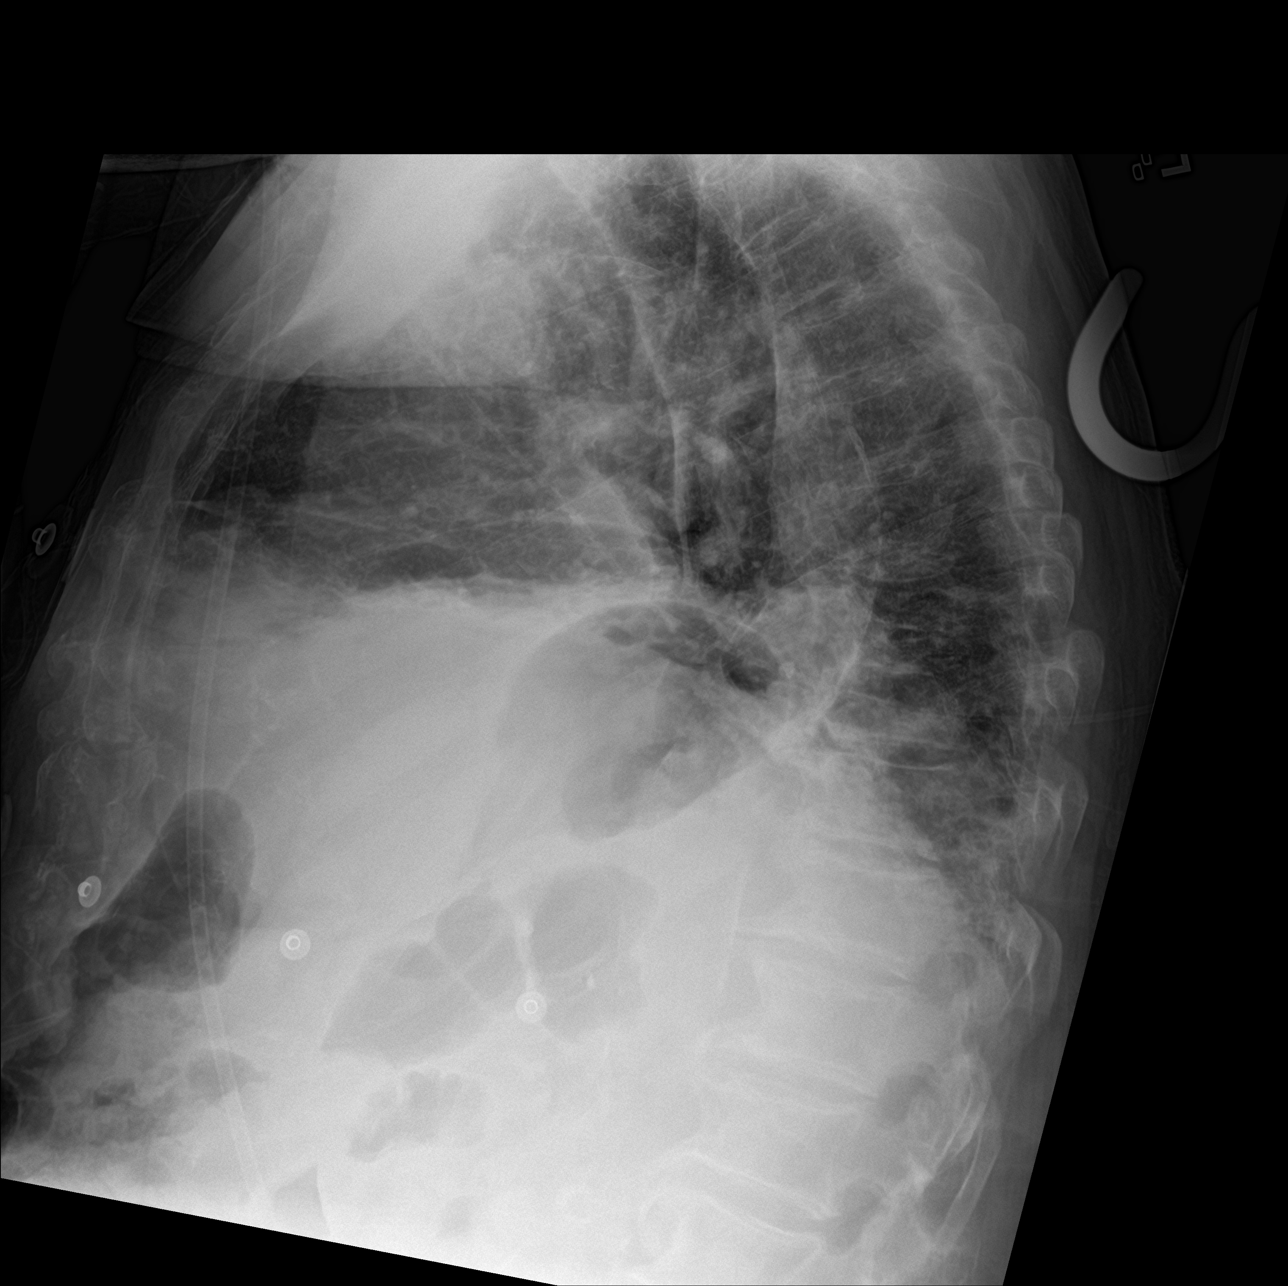

[chest ap (1 of 2)]
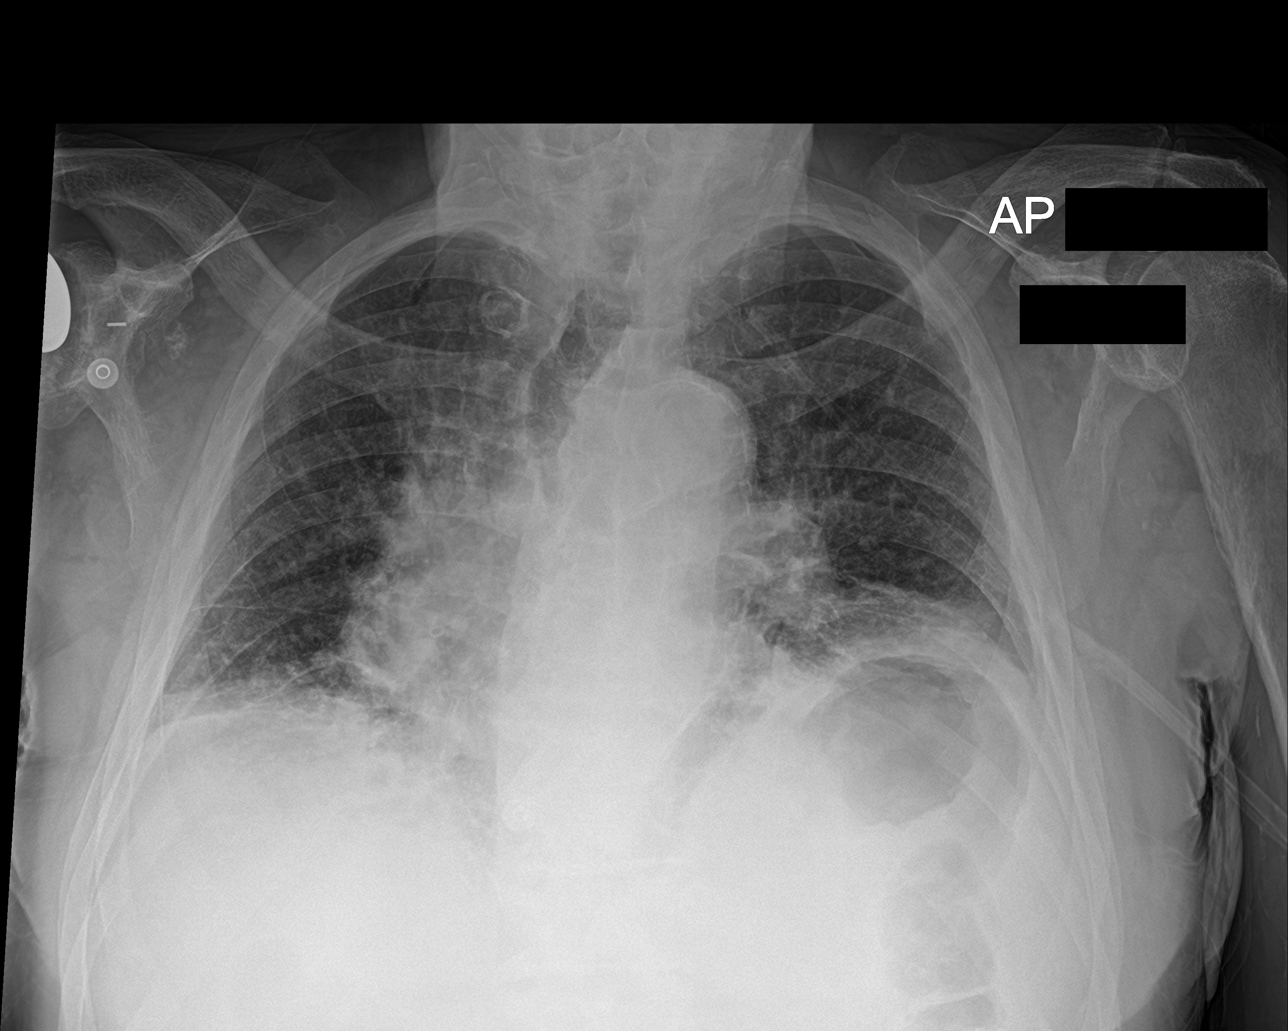

[chest ap (2 of 2)]
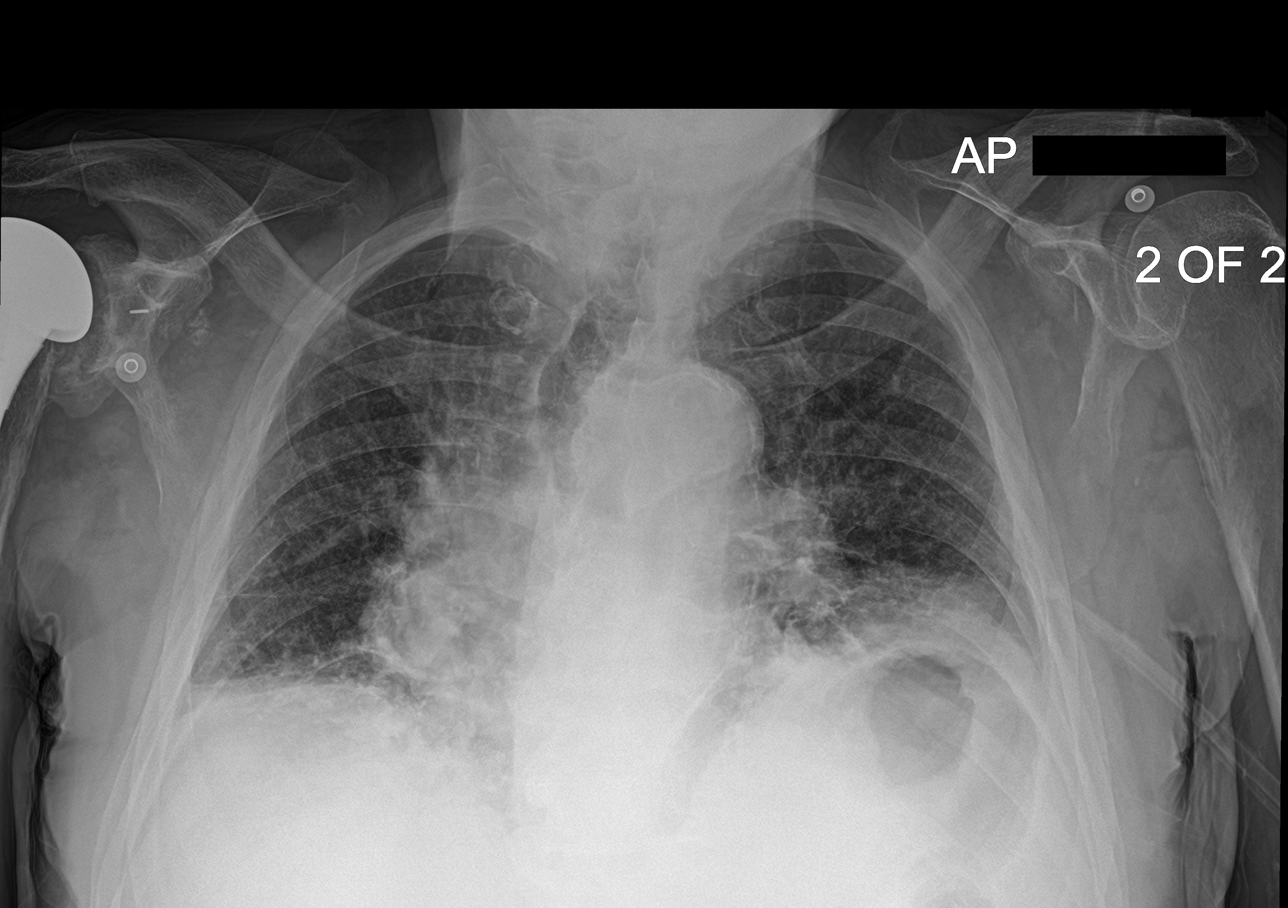

[3 of 3 positions shown; findings below may reference images not displayed]

FINDINGS: Low lung volumes limit assessment. Heart size upper normal. Aortic
atherosclerosis and tortuosity. There are streaky bibasilar
opacities. Mild diffuse peribronchial thickening. No significant
pleural effusion. No pneumothorax. Degenerative change throughout
the spine. Right shoulder arthroplasty. No acute osseous
abnormalities are seen.
IMPRESSION: 1. Low lung volumes with streaky bibasilar opacities, favoring
atelectasis. Mild diffuse peribronchial thickening.
2. Diffuse aortic atherosclerosis. Aortic tortuosity. Aortic
Atherosclerosis ([id number].R).

## 2022-10-16 IMAGING — CT CT ANGIO CHEST
2 of 6 series · 18 of 46 positions shown · IV contrast (APPLIED)
Comparison: Chest radiograph earlier today.  No prior CT available.

CLINICAL DATA: PE suspected, high prob PE in [DATE]; hypoxic

EXAM:
CT ANGIOGRAPHY CHEST WITH CONTRAST
TECHNIQUE: Multidetector CT imaging of the chest was performed using the
standard protocol during bolus administration of intravenous
contrast. Multiplanar CT image reconstructions and MIPs were
obtained to evaluate the vascular anatomy.
CONTRAST:  100mL OMNIPAQUE IOHEXOL 350 MG/ML SOLN

[Series 5: thins · axial · 0.74mm/px · z∈[-757,-515]mm · 15 of 266 slices shown]
[im 12/266  lung]
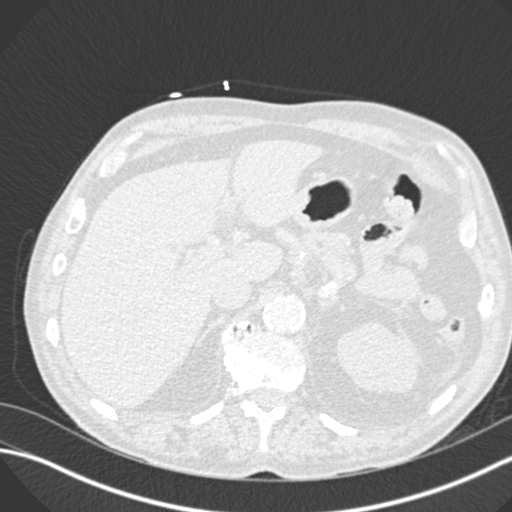
[im 35/266  soft-tissue]
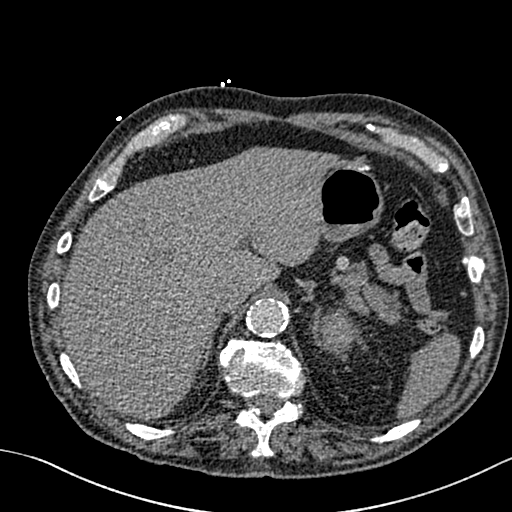
[im 47/266  lung]
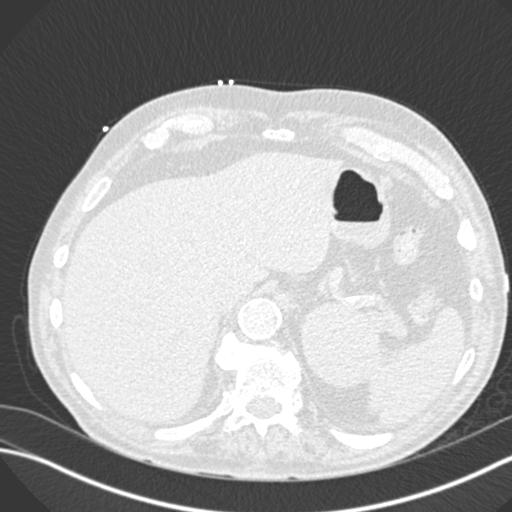
[im 70/266  soft-tissue]
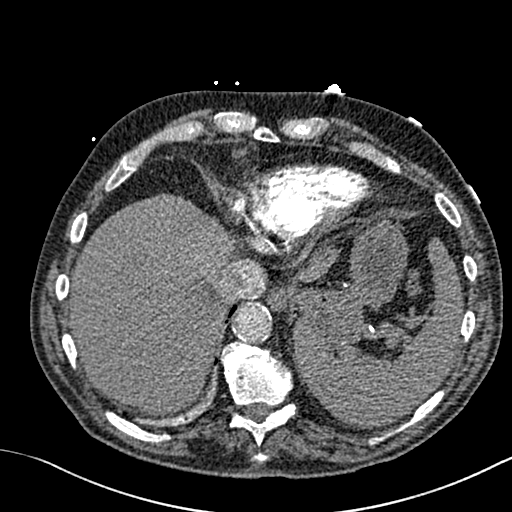
[im 81/266  lung]
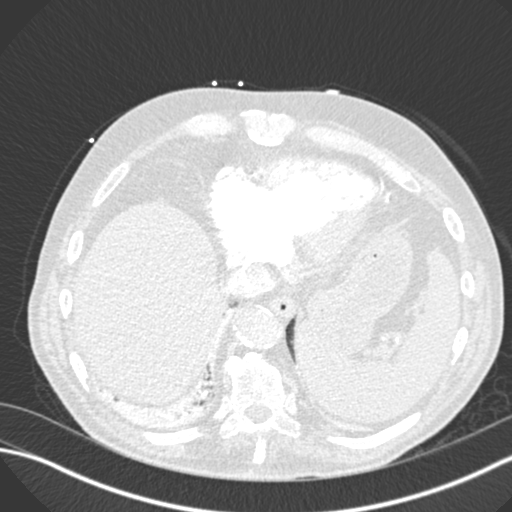
[im 104/266  soft-tissue]
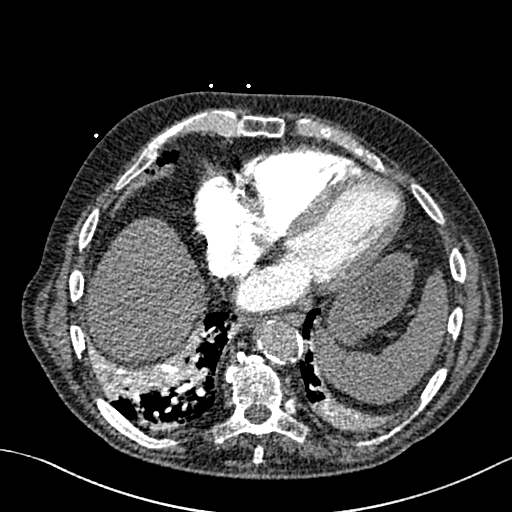
[im 116/266  lung]
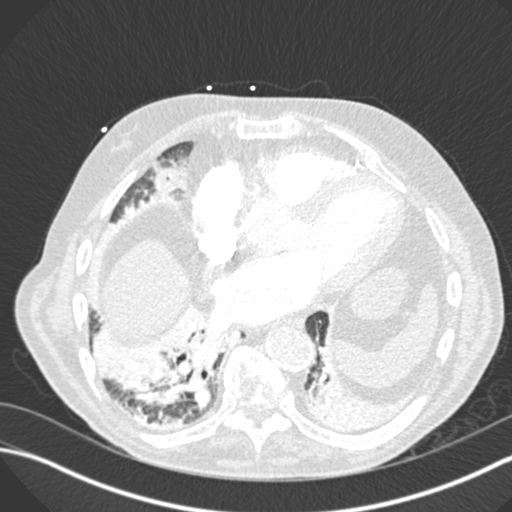
[im 139/266  soft-tissue]
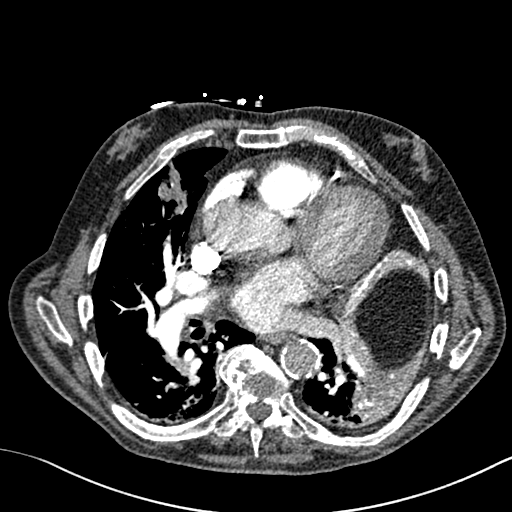
[im 150/266  lung]
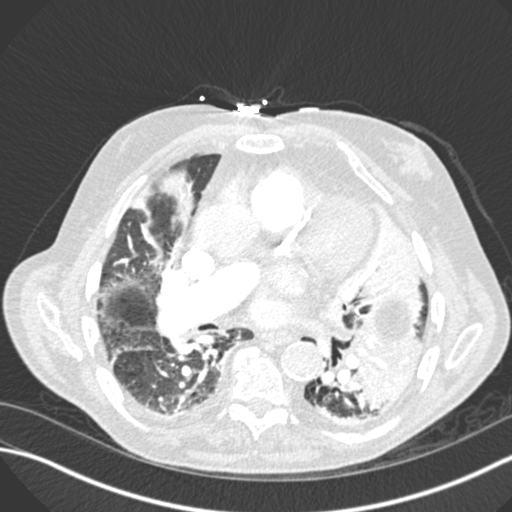
[im 162/266  soft-tissue]
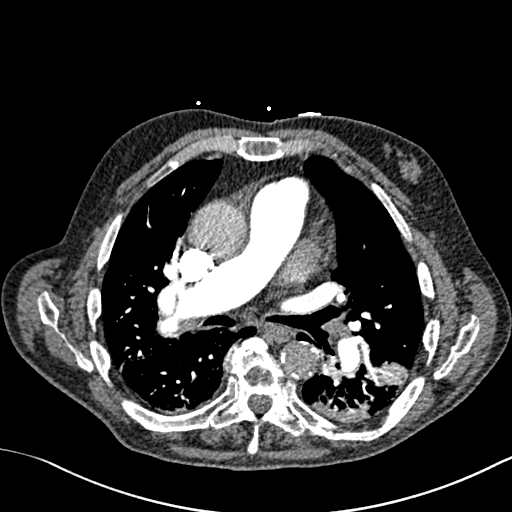
[im 185/266  lung]
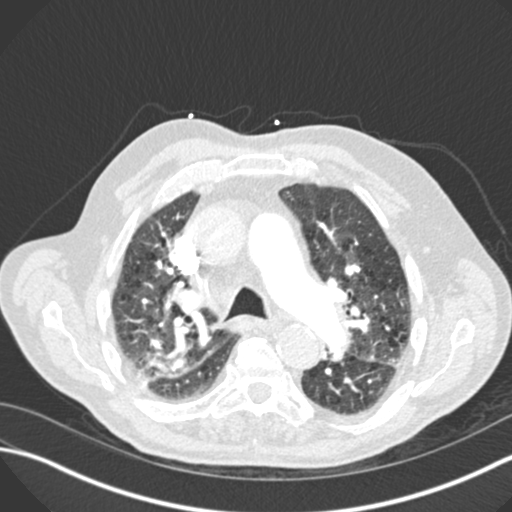
[im 196/266  soft-tissue]
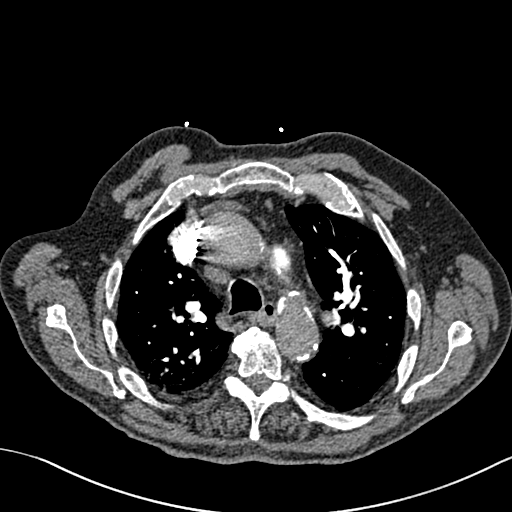
[im 219/266  lung]
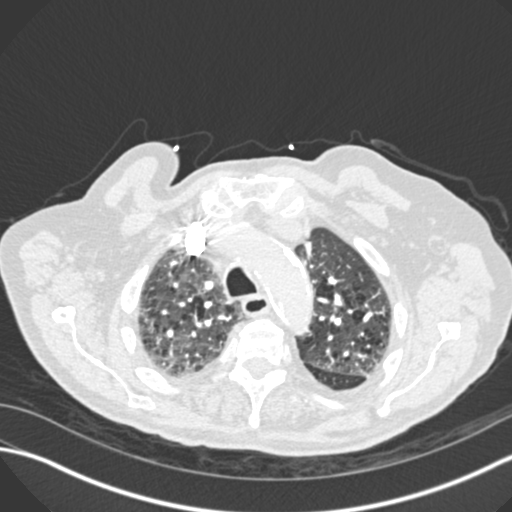
[im 231/266  soft-tissue]
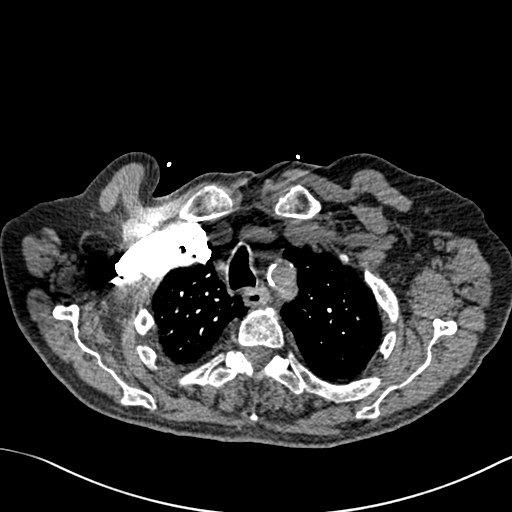
[im 254/266  lung]
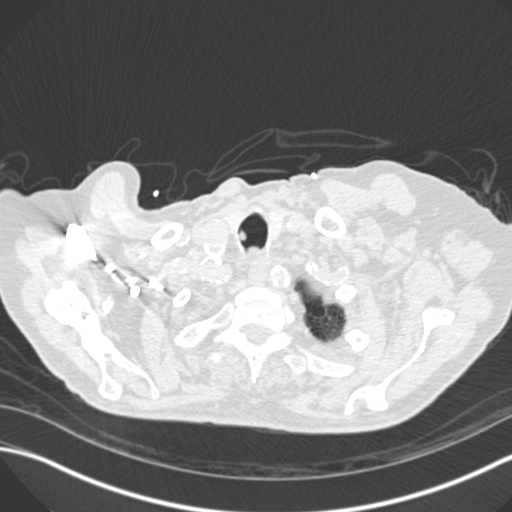

[Series 7: coronal mpr · coronal · 0.53mm/px · 3 of 106 slices shown]
[im 27/106  soft-tissue]
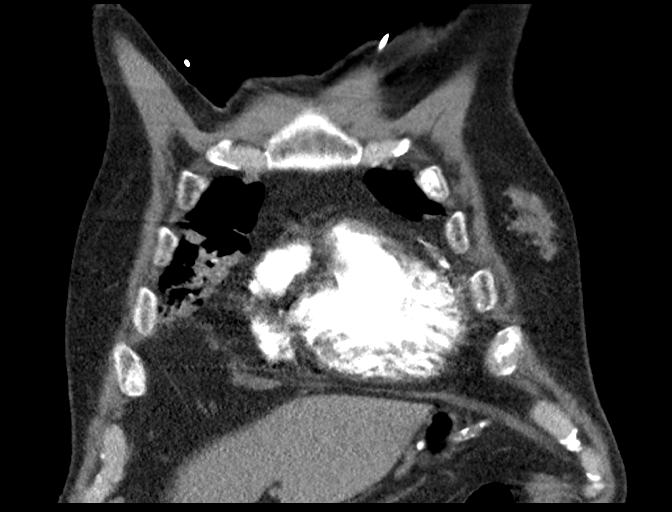
[im 53/106  soft-tissue]
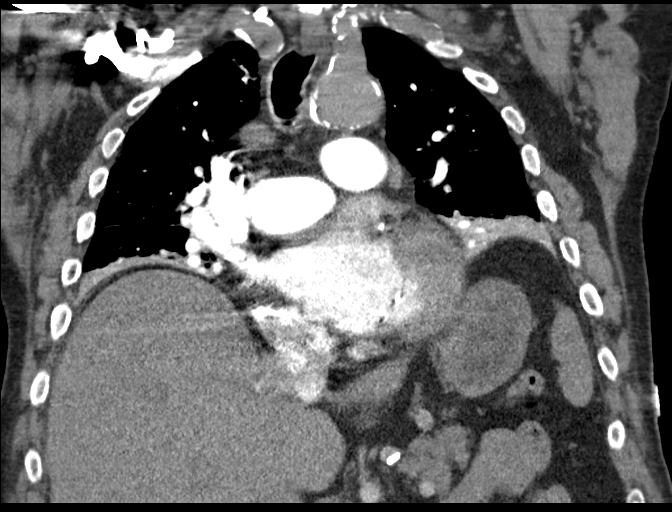
[im 79/106  soft-tissue]
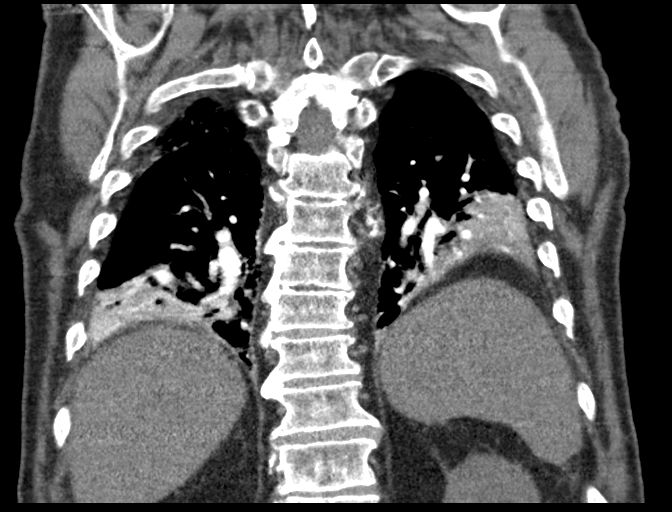

[18 of 46 positions shown; findings below may reference images not displayed]

FINDINGS: Cardiovascular: No evidence of acute or chronic pulmonary embolus.
There are no intraluminal pulmonary arterial filling defects. No
intraluminal webs. Dilated main pulmonary artery at 4 cm. Multi
chamber cardiomegaly. Coronary artery calcifications. Aortic
atherosclerosis and tortuosity. Cannot assess for dissection given
phase of contrast tailored to pulmonary artery evaluation. No
pericardial effusion.

Mediastinum/Nodes: Prominent right lower paratracheal node measures
10 mm. 10 mm left hilar node. No visualized thyroid nodule. Patulous
esophagus.

Lungs/Pleura: Bibasilar and dependent right middle lobe
consolidation. Bibasilar volume loss. Moderate emphysema. No septal
thickening or pulmonary edema no significant pleural effusion.

Upper Abdomen: Left perinephric edema is partially included,
nonspecific. Lobulated splenic contours.

Musculoskeletal: Diffuse thoracic spondylosis. Subacute or remote
lower lateral left rib fractures with callus formation. Right
shoulder arthroplasty.

Review of the MIP images confirms the above findings.
IMPRESSION: 1. No pulmonary embolus. Cardiomegaly. Dilated main pulmonary artery
consistent with pulmonary arterial hypertension.
2. Bibasilar and dependent right middle lobe consolidation with
volume loss. Atelectasis is favored, however sterility is
indeterminate by imaging, recommend correlation for pneumonia
symptoms.
3. Nonspecific left perinephric edema in the upper abdomen is
partially included, may be chronic or seen with urinary tract
infection.
4. Aortic atherosclerosis and tortuosity.
5. Moderate emphysema

Aortic Atherosclerosis (CPCP8-HKJ.J) and Emphysema (CPCP8-CA2.Q).

## 2022-10-19 IMAGING — DX DG CHEST 1V PORT
1 series · 1 of 1 positions shown · non-contrast
Comparison: March 31, 2020 chest radiograph and chest CT

CLINICAL DATA: Respiratory failure with hypoxia

EXAM:
PORTABLE CHEST 1 VIEW

[chest ap]
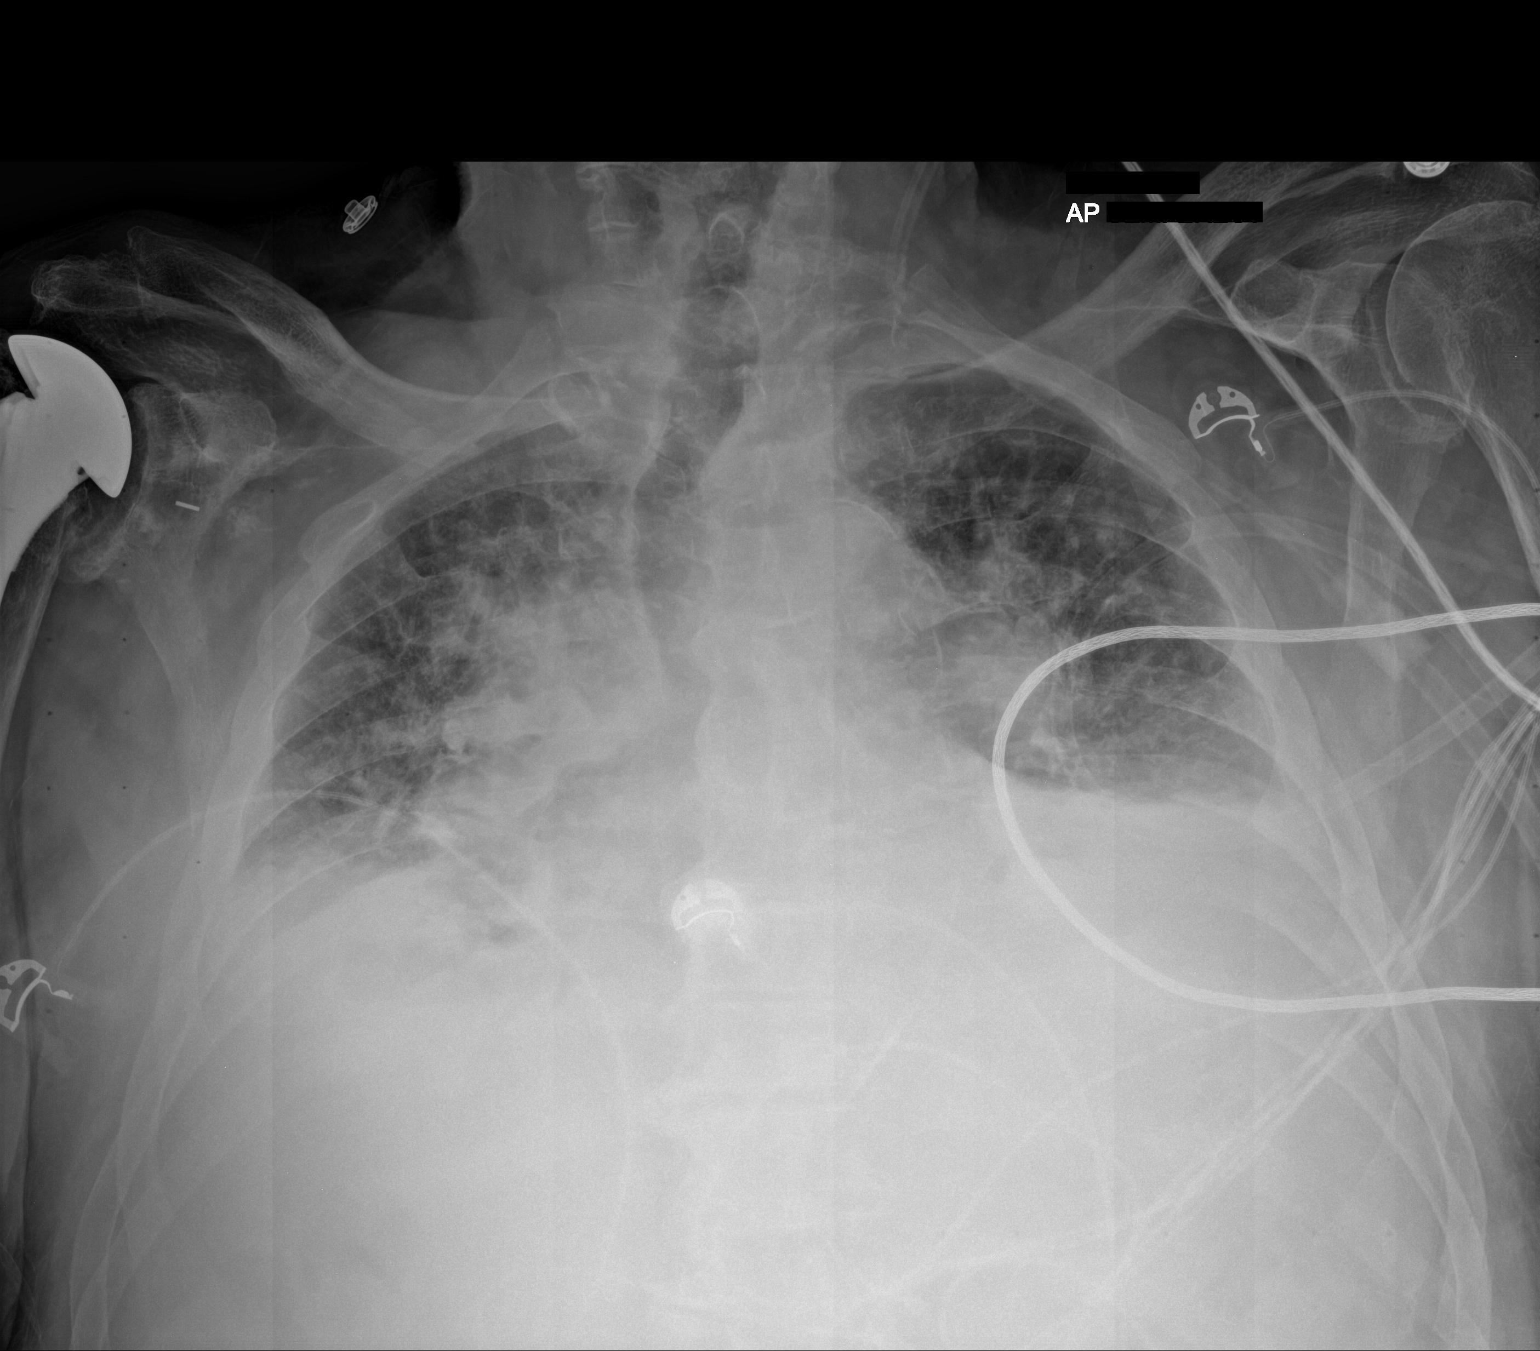

[1 of 1 positions shown; findings below may reference images not displayed]

FINDINGS: There is underlying parenchymal fibrotic type change. There are
areas of scattered atelectatic change. There is no frank edema or
consolidation. Heart is upper normal in size with pulmonary
vascularity normal. No adenopathy. There is aortic atherosclerosis.
There is a total shoulder replacement on the right. There is right
carotid artery calcification.
IMPRESSION: Underlying fibrosis and scattered areas of atelectasis. No edema or
airspace opacity. Stable cardiac silhouette. Aortic Atherosclerosis
(YBVY0-2Z4.4). There is right carotid artery calcification.

## 2022-10-26 IMAGING — DX DG CHEST 1V PORT
1 series · 1 of 1 positions shown · non-contrast
Comparison: Chest radiograph April 03, 2020. CT chest March 31, 2020.

CLINICAL DATA: Acute on chronic respiratory failure with hypoxia.

EXAM:
PORTABLE CHEST 1 VIEW

[chest ap]
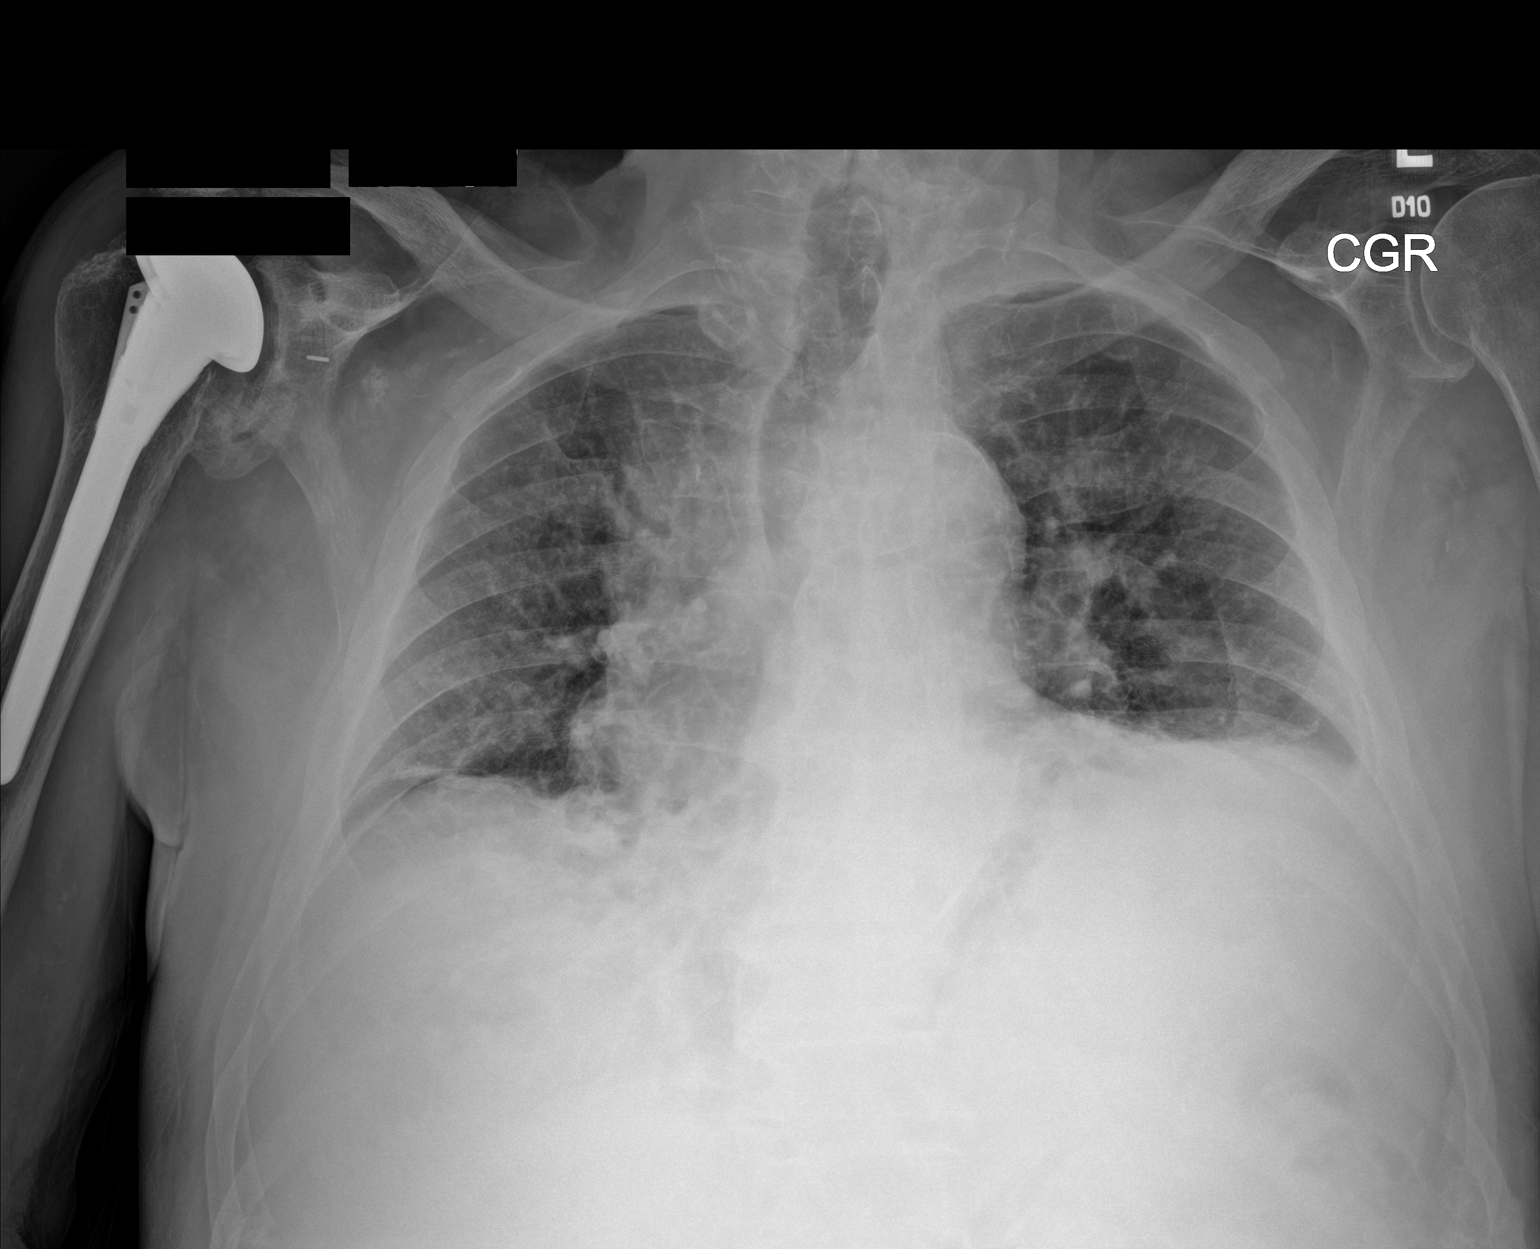

[1 of 1 positions shown; findings below may reference images not displayed]

FINDINGS: Low lung volumes. Elevated left hemidiaphragm. Similar left greater
than right basilar opacities, better characterized on prior CT
chest. No new consolidation. No visible pneumothorax. No sizable
pleural effusions. Left costophrenic sulcus blunting is favored to
relate to left lower lobe opacity (and not a pleural effusion) when
correlating with prior CT chest. Cardiomediastinal silhouette is
enlarged and accentuated by low lung volumes and portable AP
technique. No acute osseous abnormality. Right reverse shoulder
arthroplasty.
IMPRESSION: 1. Similar appearance to priors with low lung volumes and left
greater than right basilar opacities which are favored to represent
atelectasis, although infection is not excluded.
2. Cardiomegaly.

## 2022-12-10 IMAGING — CR DG CHEST 2V
2 series · 2 of 2 positions shown · non-contrast
Comparison: 04/10/2020

CLINICAL DATA: Shortness of breath

EXAM:
CHEST - 2 VIEW

[chest ap]
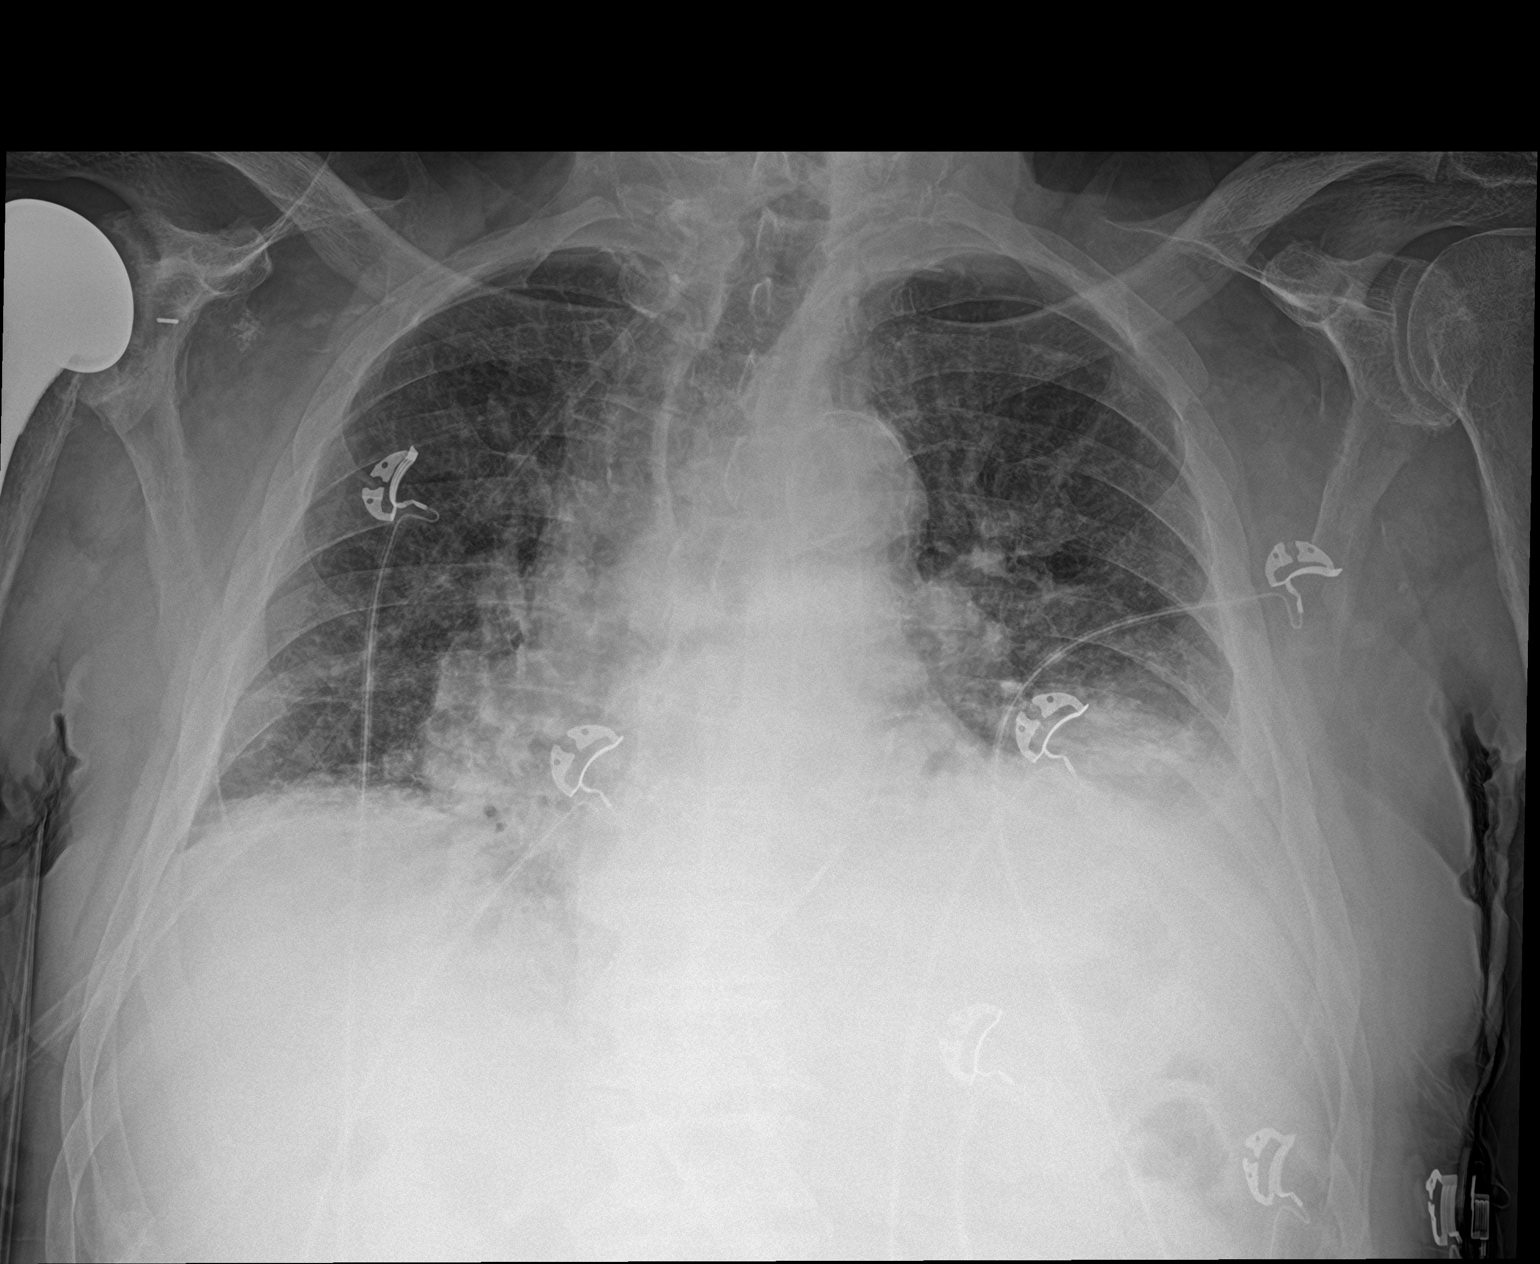

[chest lat]
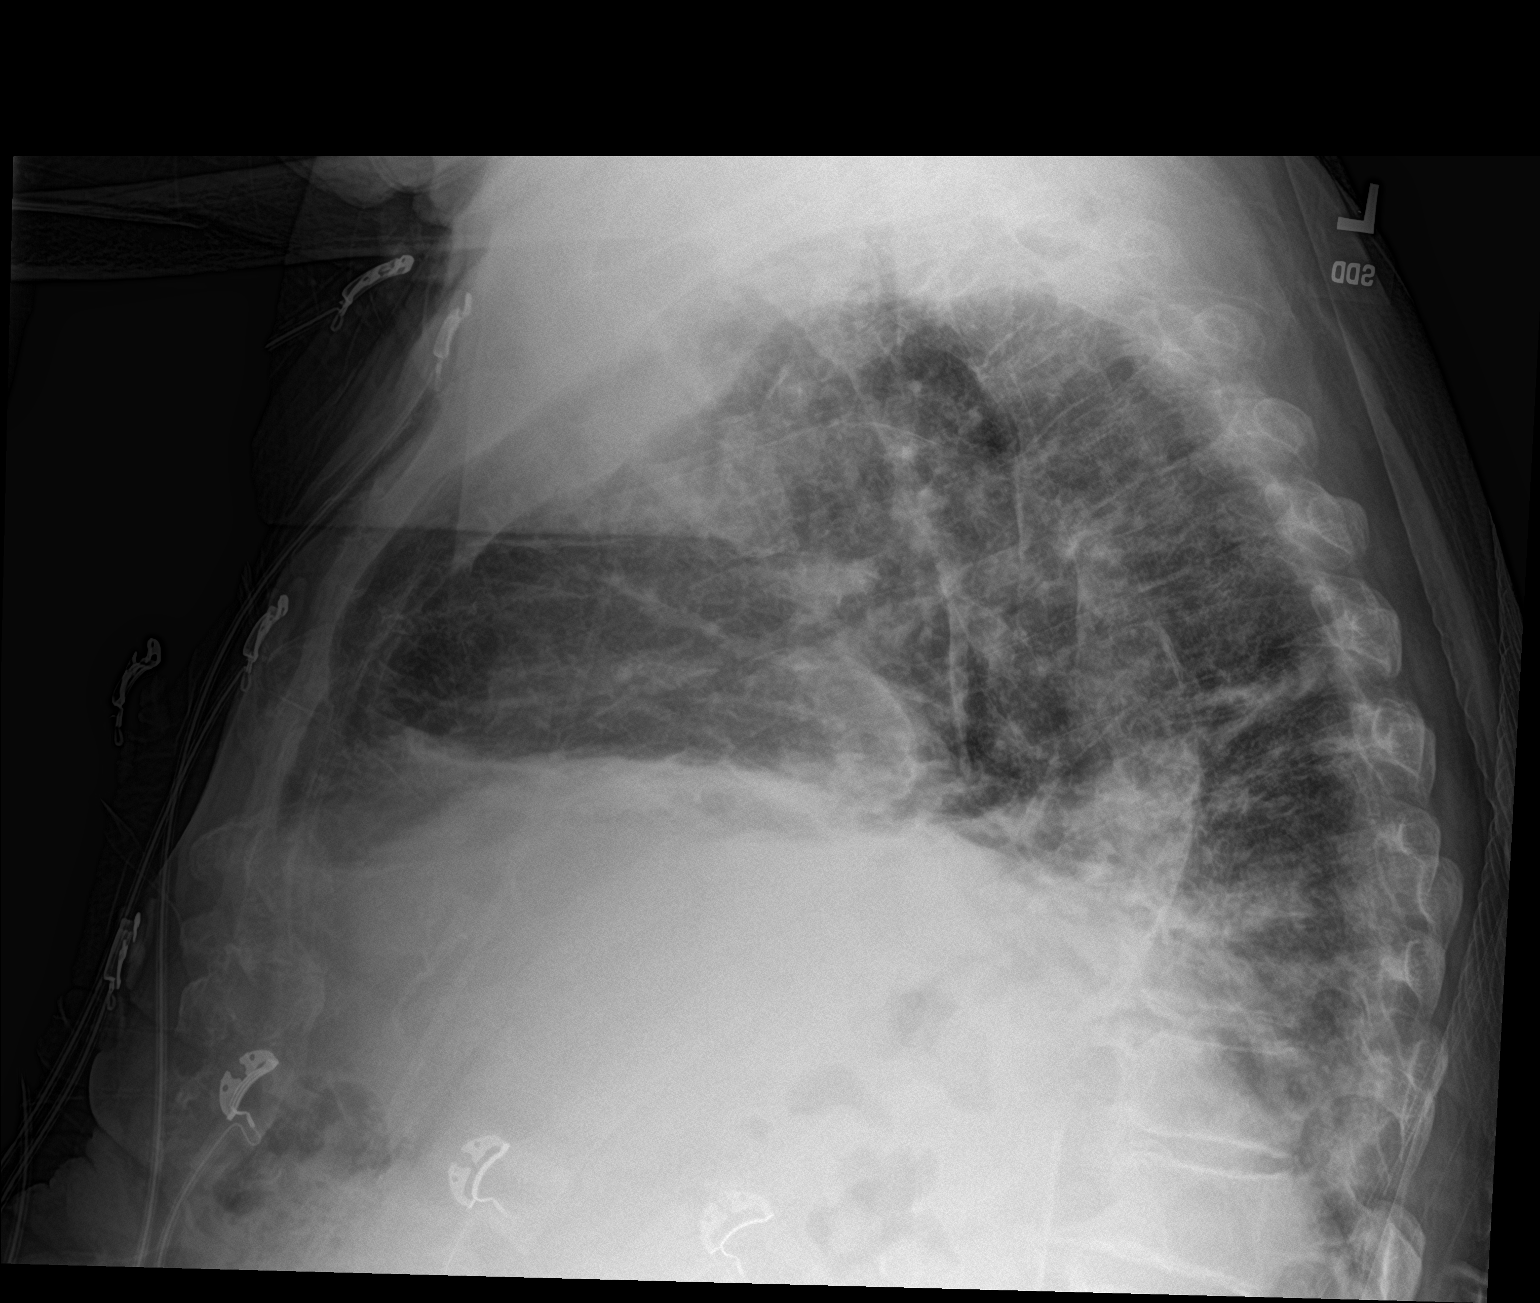

[2 of 2 positions shown; findings below may reference images not displayed]

FINDINGS: The heart size and mediastinal contours are within normal limits.
Low volume examination with diffuse bilateral interstitial pulmonary
opacity and atelectasis or consolidation of the lung bases,
generally similar in appearance to prior examination. Disc
degenerative disease of the thoracic spine.
IMPRESSION: Low volume examination with diffuse bilateral interstitial pulmonary
opacity and atelectasis or consolidation of the lung bases,
generally similar in appearance to prior examination. Findings may
reflect edema and/or chronic interstitial change. No new or focal
airspace opacity.

## 2022-12-10 IMAGING — CT CT ANGIO CHEST
2 of 6 series · 18 of 46 positions shown · IV contrast (APPLIED)
Comparison: 03/31/2020

CLINICAL DATA: Shortness of breath

EXAM:
CT ANGIOGRAPHY CHEST WITH CONTRAST
TECHNIQUE: Multidetector CT imaging of the chest was performed using the
standard protocol during bolus administration of intravenous
contrast. Multiplanar CT image reconstructions and MIPs were
obtained to evaluate the vascular anatomy.
CONTRAST:  75mL OMNIPAQUE IOHEXOL 350 MG/ML SOLN

[Series 5: thins · axial · 0.83mm/px · z∈[-788,-504]mm · 15 of 312 slices shown]
[im 14/312  lung]
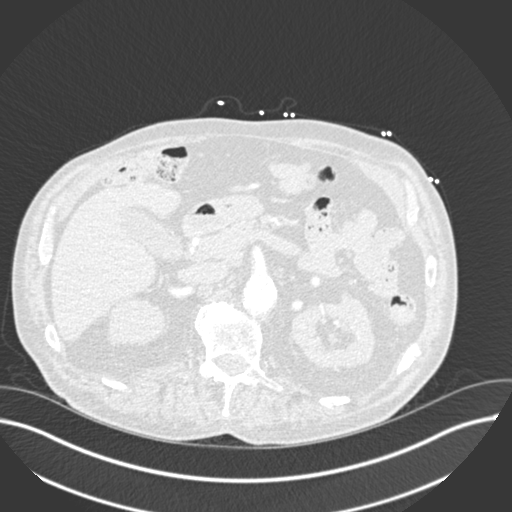
[im 41/312  soft-tissue]
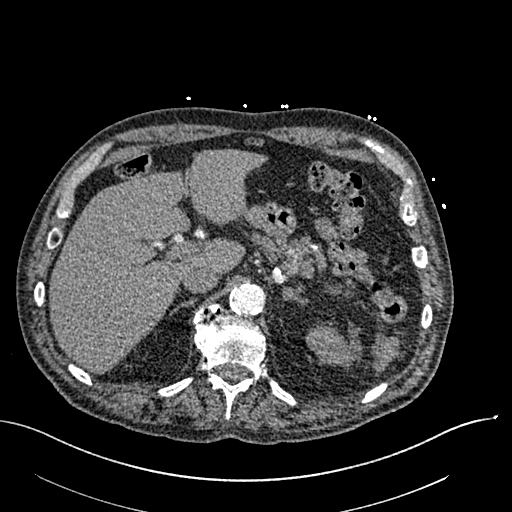
[im 55/312  lung]
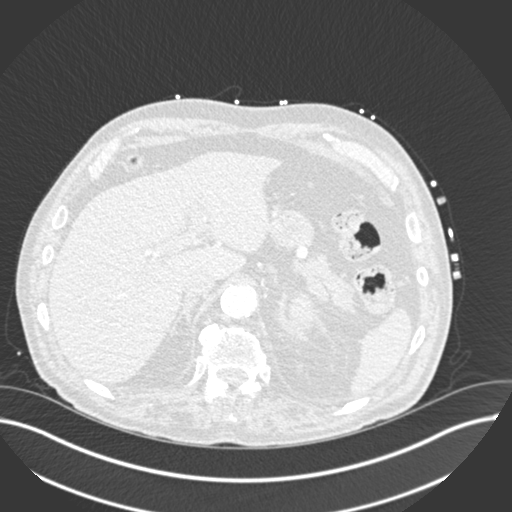
[im 82/312  soft-tissue]
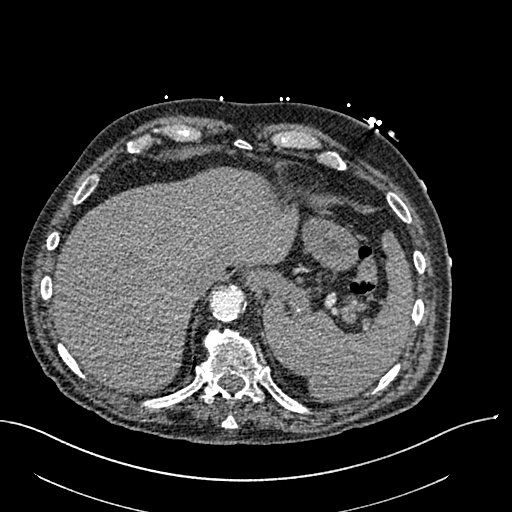
[im 95/312  lung]
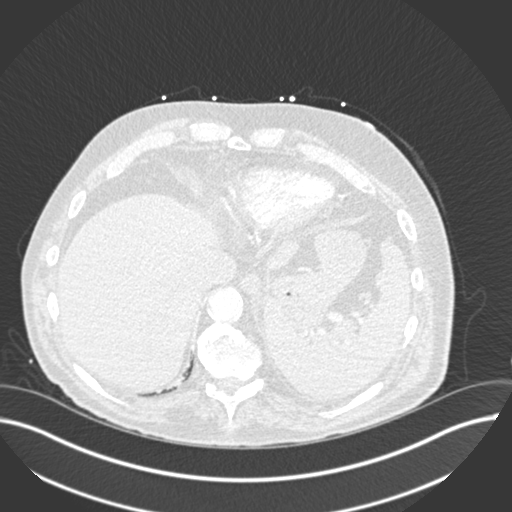
[im 122/312  soft-tissue]
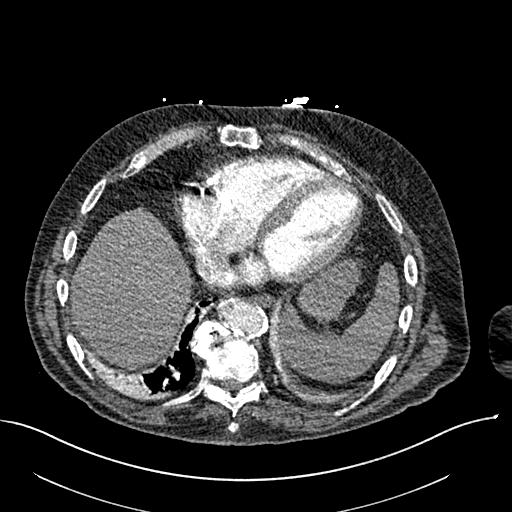
[im 136/312  lung]
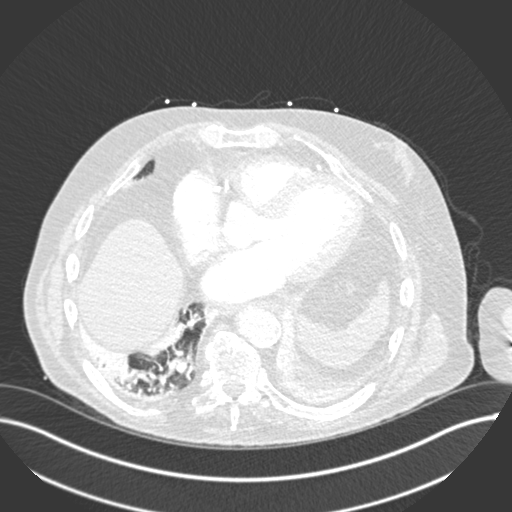
[im 163/312  soft-tissue]
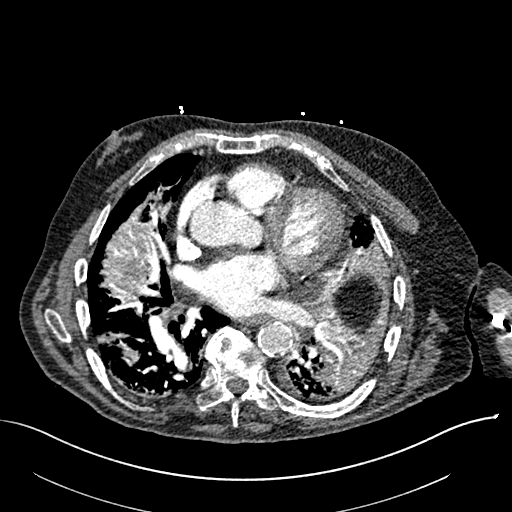
[im 176/312  lung]
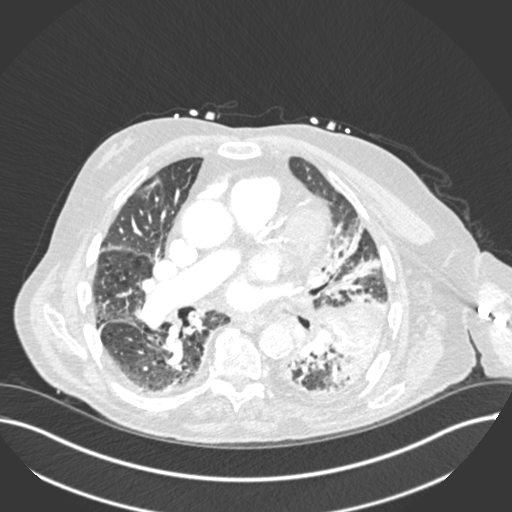
[im 190/312  soft-tissue]
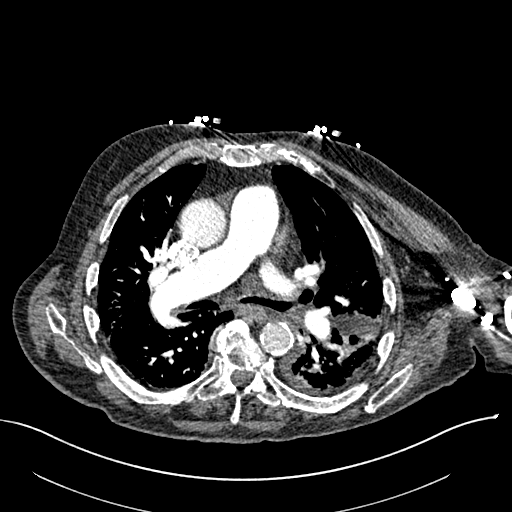
[im 217/312  lung]
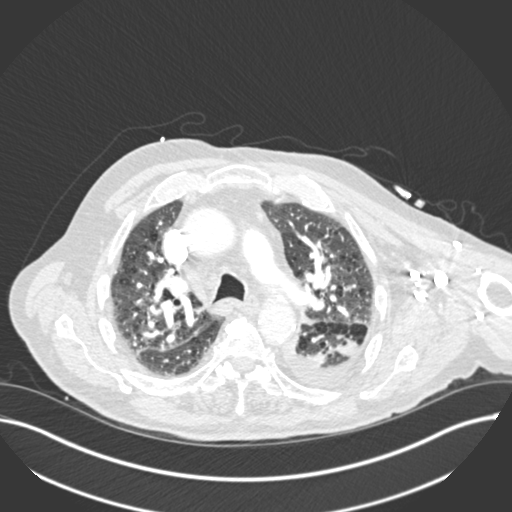
[im 230/312  soft-tissue]
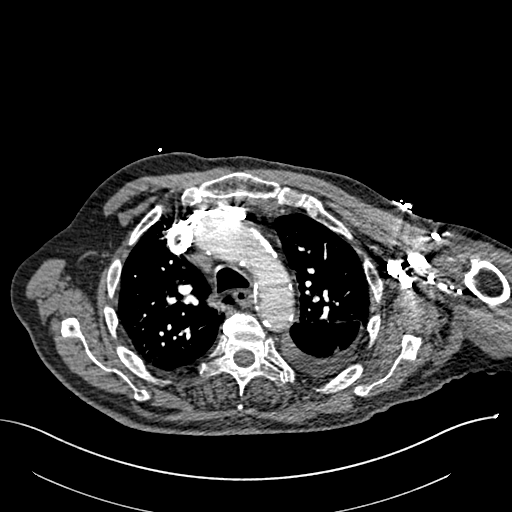
[im 257/312  lung]
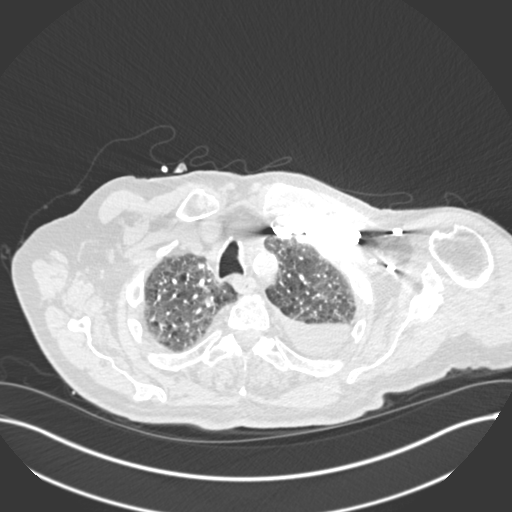
[im 271/312  soft-tissue]
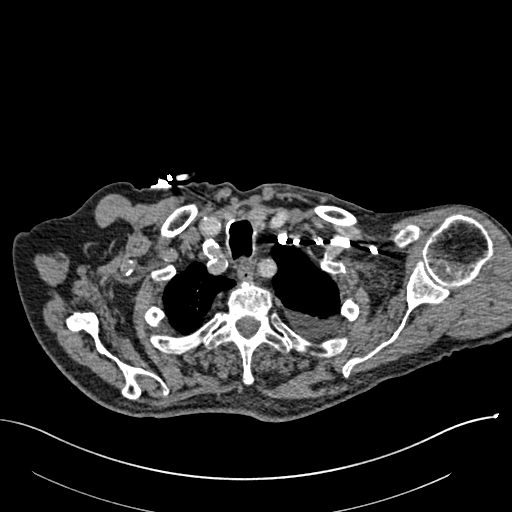
[im 298/312  lung]
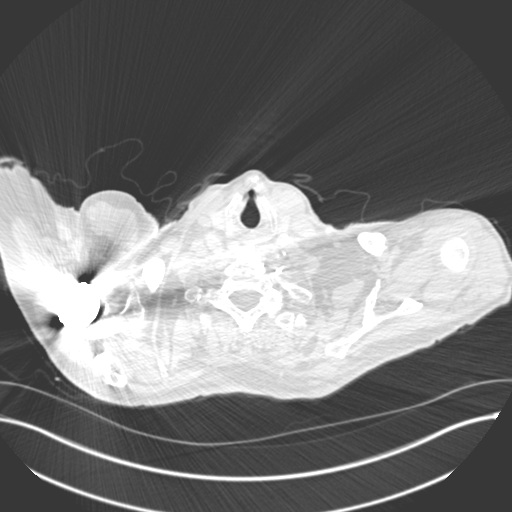

[Series 7: coronal mpr · coronal · 0.61mm/px · 3 of 104 slices shown]
[im 26/104  soft-tissue]
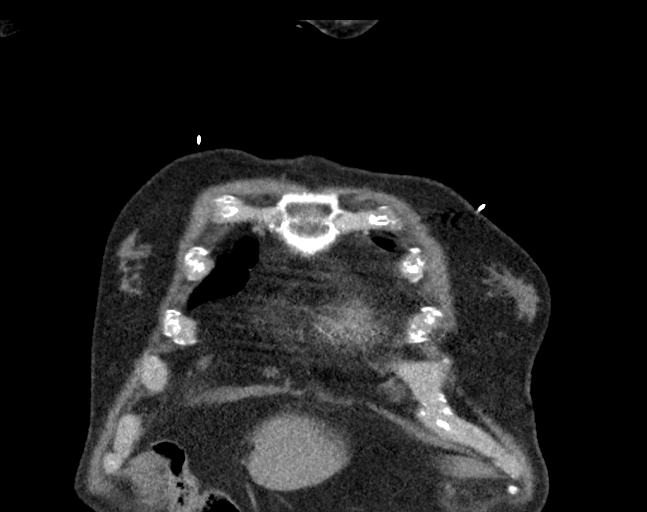
[im 52/104  soft-tissue]
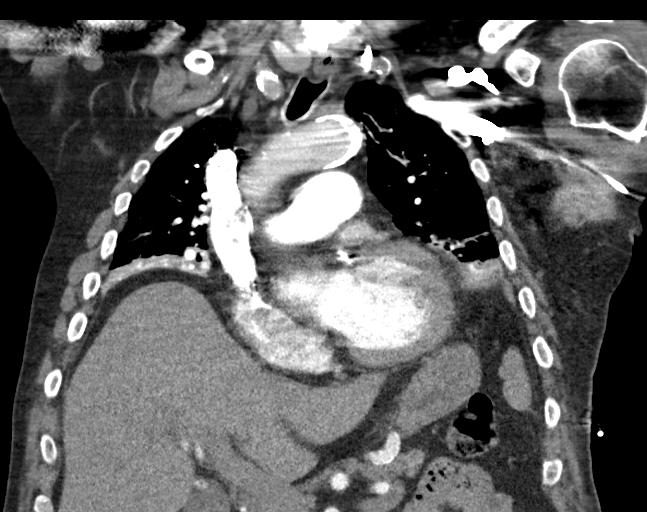
[im 78/104  soft-tissue]
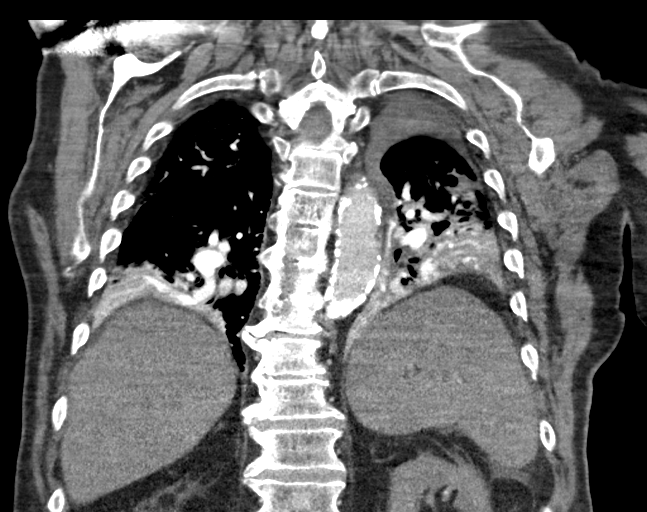

[18 of 46 positions shown; findings below may reference images not displayed]

FINDINGS: Cardiovascular: Satisfactory opacification of the pulmonary arteries
to the segmental level. No evidence of pulmonary embolism.
Cardiomegaly. Three-vessel coronary artery calcifications. No
pericardial effusion. Aortic atherosclerosis.

Mediastinum/Nodes: No enlarged mediastinal, hilar, or axillary lymph
nodes. Thyroid gland, trachea, and esophagus demonstrate no
significant findings.

Lungs/Pleura: Small left pleural effusion. Low pulmonary volumes
with extensive dependent bibasilar atelectasis and/or consolidation,
similar in appearance to prior examination. Mild centrilobular
emphysema.

Upper Abdomen: No acute abnormality.

Musculoskeletal: No chest wall abnormality. No acute or significant
osseous findings.

Review of the MIP images confirms the above findings.
IMPRESSION: 1. Negative examination for pulmonary embolism.
2. Small left pleural effusion.
3. Low pulmonary volumes with extensive dependent bibasilar
atelectasis and/or consolidation, similar in appearance to prior
examination.
4. Emphysema.
5. Coronary artery disease.

Aortic Atherosclerosis (50VGF-YJ5.5) and Emphysema (50VGF-ZAO.4).

## 2022-12-23 IMAGING — DX DG CHEST 1V PORT
1 series · 1 of 1 positions shown · non-contrast
Comparison: 05/25/2020, CT 05/25/2020

CLINICAL DATA: Dyspnea

EXAM:
PORTABLE CHEST 1 VIEW

[chest ap]
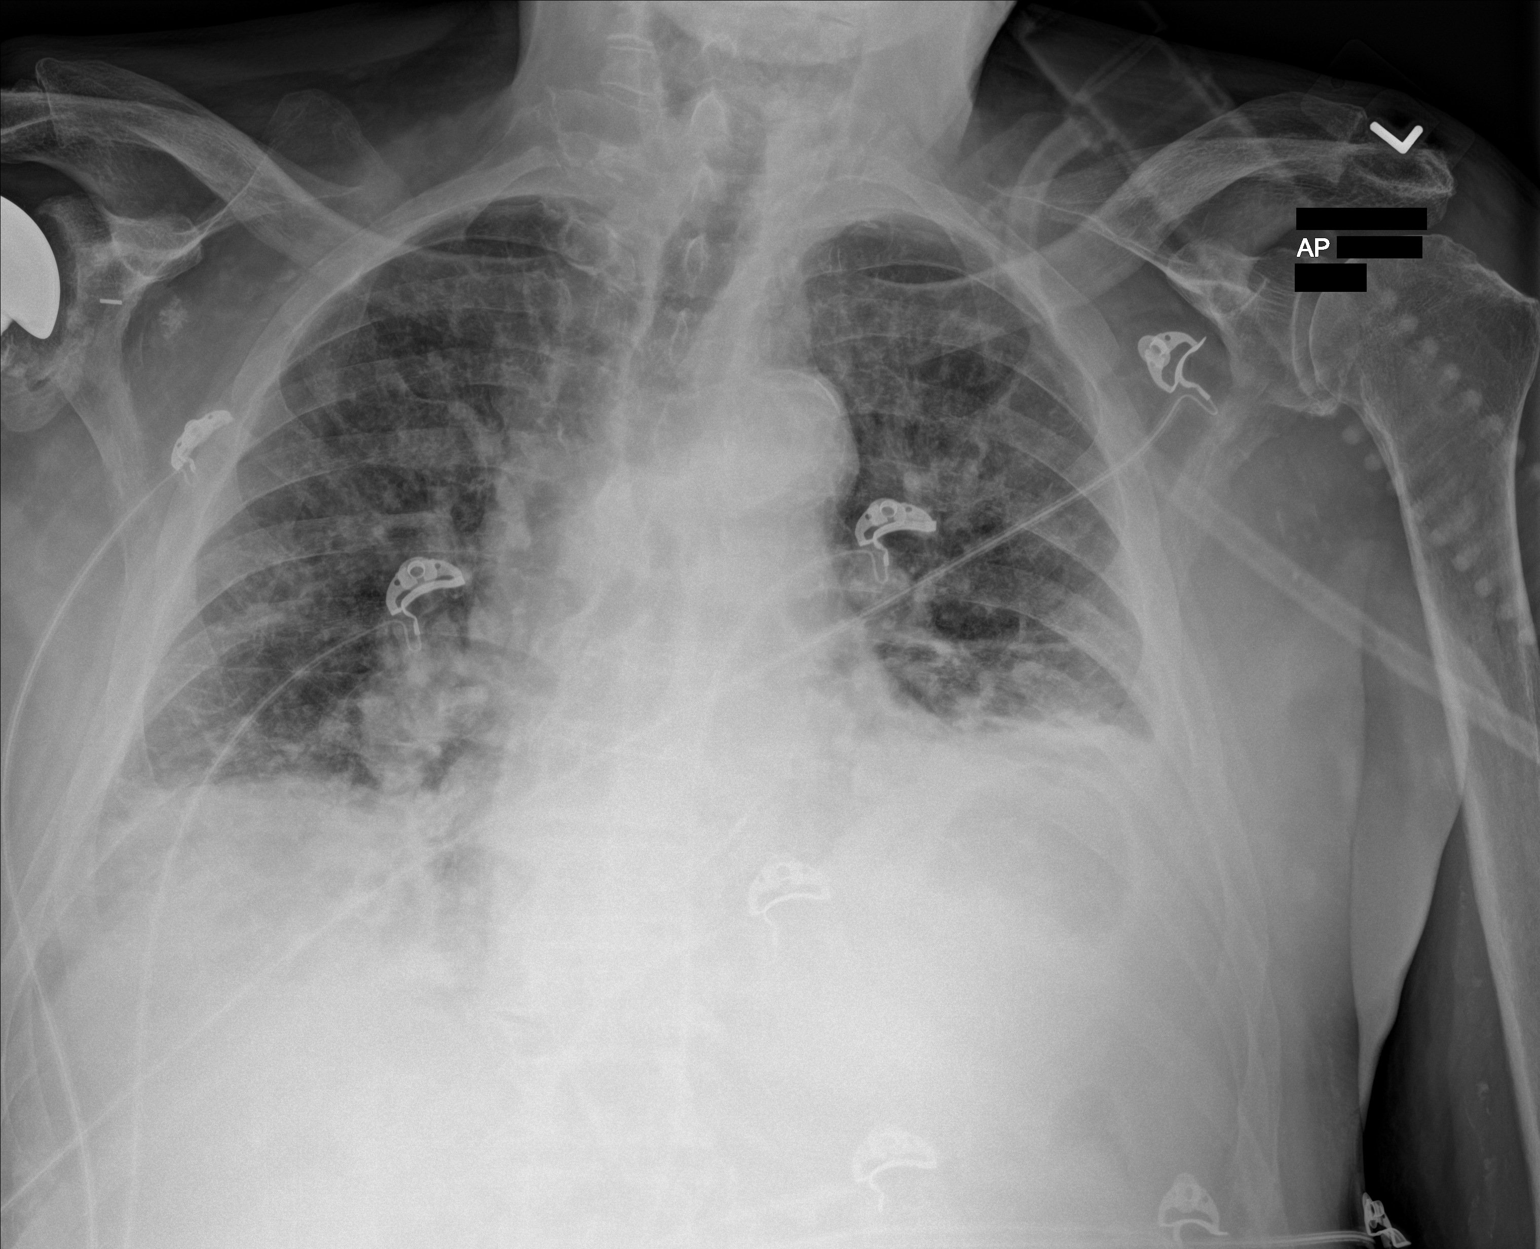

[1 of 1 positions shown; findings below may reference images not displayed]

FINDINGS: Stable cardiomediastinal silhouette. Vascular congestion and mild
diffuse interstitial and ground-glass opacity likely pulmonary
edema. Small left-sided pleural effusion. No pneumothorax. Aortic
atherosclerosis.
IMPRESSION: Continued vascular congestion and mild diffuse interstitial and
ground-glass opacity likely pulmonary edema. Small left-sided
pleural effusion.
